# Patient Record
Sex: Female | Born: 1950 | Race: Black or African American | Hispanic: No | Smoking: Former smoker
Health system: Southern US, Community
[De-identification: ages and names within clinical notes are randomized; demographics above are authoritative.]

## PROBLEM LIST (undated history)

## (undated) DIAGNOSIS — M2550 Pain in unspecified joint: Secondary | ICD-10-CM

## (undated) DIAGNOSIS — Z9289 Personal history of other medical treatment: Secondary | ICD-10-CM

## (undated) DIAGNOSIS — E785 Hyperlipidemia, unspecified: Secondary | ICD-10-CM

## (undated) DIAGNOSIS — E119 Type 2 diabetes mellitus without complications: Secondary | ICD-10-CM

## (undated) DIAGNOSIS — M549 Dorsalgia, unspecified: Secondary | ICD-10-CM

## (undated) DIAGNOSIS — K219 Gastro-esophageal reflux disease without esophagitis: Secondary | ICD-10-CM

## (undated) HISTORY — PX: CHOLECYSTECTOMY: SHX55

## (undated) HISTORY — PX: COLONOSCOPY: SHX174

## (undated) HISTORY — DX: Dorsalgia, unspecified: M54.9

## (undated) HISTORY — PX: LEG SURGERY: SHX1003

## (undated) HISTORY — PX: REPLACEMENT TOTAL KNEE: SUR1224

## (undated) HISTORY — DX: Pain in unspecified joint: M25.50

## (undated) HISTORY — PX: ABDOMINAL HYSTERECTOMY: SHX81

---

## 1998-01-12 ENCOUNTER — Other Ambulatory Visit: Admission: RE | Admit: 1998-01-12 | Discharge: 1998-01-12 | Payer: Self-pay | Admitting: Obstetrics and Gynecology

## 1998-03-07 ENCOUNTER — Encounter: Payer: Self-pay | Admitting: Orthopedic Surgery

## 1998-03-07 ENCOUNTER — Encounter: Payer: Self-pay | Admitting: Emergency Medicine

## 1998-03-07 ENCOUNTER — Inpatient Hospital Stay (HOSPITAL_COMMUNITY): Admission: EM | Admit: 1998-03-07 | Discharge: 1998-03-13 | Payer: Self-pay | Admitting: Emergency Medicine

## 1998-03-13 ENCOUNTER — Inpatient Hospital Stay
Admission: RE | Admit: 1998-03-13 | Discharge: 1998-03-21 | Payer: Self-pay | Admitting: Physical Medicine and Rehabilitation

## 1998-06-13 ENCOUNTER — Encounter: Admission: RE | Admit: 1998-06-13 | Discharge: 1998-09-11 | Payer: Self-pay | Admitting: Orthopedic Surgery

## 1998-09-21 ENCOUNTER — Encounter: Admission: RE | Admit: 1998-09-21 | Discharge: 1999-05-04 | Payer: Self-pay

## 1998-11-21 ENCOUNTER — Encounter: Payer: Self-pay | Admitting: Orthopedic Surgery

## 1998-11-23 ENCOUNTER — Ambulatory Visit (HOSPITAL_COMMUNITY): Admission: RE | Admit: 1998-11-23 | Discharge: 1998-11-23 | Payer: Self-pay | Admitting: Orthopedic Surgery

## 1998-12-12 ENCOUNTER — Encounter: Admission: RE | Admit: 1998-12-12 | Discharge: 1999-02-20 | Payer: Self-pay | Admitting: Orthopedic Surgery

## 1999-06-13 ENCOUNTER — Ambulatory Visit (HOSPITAL_COMMUNITY): Admission: RE | Admit: 1999-06-13 | Discharge: 1999-06-14 | Payer: Self-pay | Admitting: Orthopedic Surgery

## 1999-07-10 ENCOUNTER — Other Ambulatory Visit: Admission: RE | Admit: 1999-07-10 | Discharge: 1999-07-10 | Payer: Self-pay | Admitting: Obstetrics and Gynecology

## 2001-06-18 ENCOUNTER — Encounter: Payer: Self-pay | Admitting: Orthopedic Surgery

## 2001-06-18 ENCOUNTER — Emergency Department (HOSPITAL_COMMUNITY): Admission: EM | Admit: 2001-06-18 | Discharge: 2001-06-18 | Payer: Self-pay | Admitting: Emergency Medicine

## 2002-05-03 ENCOUNTER — Encounter (INDEPENDENT_AMBULATORY_CARE_PROVIDER_SITE_OTHER): Payer: Self-pay | Admitting: *Deleted

## 2002-05-03 LAB — CONVERTED CEMR LAB

## 2002-05-05 ENCOUNTER — Ambulatory Visit (HOSPITAL_COMMUNITY): Admission: RE | Admit: 2002-05-05 | Discharge: 2002-05-05 | Payer: Self-pay | Admitting: Family Medicine

## 2002-09-24 ENCOUNTER — Encounter: Admission: RE | Admit: 2002-09-24 | Discharge: 2002-09-24 | Payer: Self-pay | Admitting: Family Medicine

## 2003-01-25 ENCOUNTER — Encounter: Admission: RE | Admit: 2003-01-25 | Discharge: 2003-01-25 | Payer: Self-pay | Admitting: Family Medicine

## 2003-02-02 ENCOUNTER — Encounter: Admission: RE | Admit: 2003-02-02 | Discharge: 2003-05-03 | Payer: Self-pay | Admitting: Family Medicine

## 2003-05-02 ENCOUNTER — Encounter: Admission: RE | Admit: 2003-05-02 | Discharge: 2003-05-02 | Payer: Self-pay | Admitting: Sports Medicine

## 2003-05-03 ENCOUNTER — Encounter: Admission: RE | Admit: 2003-05-03 | Discharge: 2003-05-18 | Payer: Self-pay | Admitting: Family Medicine

## 2003-05-11 ENCOUNTER — Ambulatory Visit (HOSPITAL_COMMUNITY): Admission: RE | Admit: 2003-05-11 | Discharge: 2003-05-11 | Payer: Self-pay | Admitting: Sports Medicine

## 2004-01-13 ENCOUNTER — Encounter: Admission: RE | Admit: 2004-01-13 | Discharge: 2004-01-13 | Payer: Self-pay | Admitting: Family Medicine

## 2004-02-17 ENCOUNTER — Ambulatory Visit: Payer: Self-pay | Admitting: Family Medicine

## 2004-03-23 ENCOUNTER — Ambulatory Visit: Payer: Self-pay | Admitting: Sports Medicine

## 2004-04-24 ENCOUNTER — Ambulatory Visit: Payer: Self-pay | Admitting: Family Medicine

## 2004-06-15 ENCOUNTER — Ambulatory Visit: Payer: Self-pay | Admitting: Family Medicine

## 2004-06-20 ENCOUNTER — Ambulatory Visit (HOSPITAL_COMMUNITY): Admission: RE | Admit: 2004-06-20 | Discharge: 2004-06-20 | Payer: Self-pay | Admitting: Family Medicine

## 2004-10-15 ENCOUNTER — Ambulatory Visit: Payer: Self-pay | Admitting: Family Medicine

## 2004-10-15 ENCOUNTER — Ambulatory Visit (HOSPITAL_COMMUNITY): Admission: RE | Admit: 2004-10-15 | Discharge: 2004-10-15 | Payer: Self-pay | Admitting: Family Medicine

## 2004-11-07 ENCOUNTER — Ambulatory Visit: Payer: Self-pay | Admitting: Physical Medicine & Rehabilitation

## 2004-11-07 ENCOUNTER — Inpatient Hospital Stay (HOSPITAL_COMMUNITY): Admission: RE | Admit: 2004-11-07 | Discharge: 2004-11-13 | Payer: Self-pay | Admitting: Orthopedic Surgery

## 2004-11-17 ENCOUNTER — Inpatient Hospital Stay (HOSPITAL_COMMUNITY): Admission: RE | Admit: 2004-11-17 | Discharge: 2004-11-21 | Payer: Self-pay | Admitting: Orthopedic Surgery

## 2004-12-14 ENCOUNTER — Ambulatory Visit (HOSPITAL_COMMUNITY): Admission: RE | Admit: 2004-12-14 | Discharge: 2004-12-14 | Payer: Self-pay | Admitting: Orthopedic Surgery

## 2004-12-24 ENCOUNTER — Ambulatory Visit (HOSPITAL_COMMUNITY): Admission: RE | Admit: 2004-12-24 | Discharge: 2004-12-24 | Payer: Self-pay | Admitting: Orthopedic Surgery

## 2005-01-02 ENCOUNTER — Encounter: Admission: RE | Admit: 2005-01-02 | Discharge: 2005-01-20 | Payer: Self-pay | Admitting: Orthopedic Surgery

## 2005-01-21 ENCOUNTER — Encounter: Admission: RE | Admit: 2005-01-21 | Discharge: 2005-03-01 | Payer: Self-pay | Admitting: Orthopedic Surgery

## 2005-02-15 ENCOUNTER — Ambulatory Visit (HOSPITAL_COMMUNITY): Admission: RE | Admit: 2005-02-15 | Discharge: 2005-02-15 | Payer: Self-pay | Admitting: Orthopedic Surgery

## 2005-03-06 ENCOUNTER — Ambulatory Visit (HOSPITAL_COMMUNITY): Admission: RE | Admit: 2005-03-06 | Discharge: 2005-03-06 | Payer: Self-pay | Admitting: Orthopedic Surgery

## 2005-03-11 ENCOUNTER — Ambulatory Visit (HOSPITAL_COMMUNITY): Admission: RE | Admit: 2005-03-11 | Discharge: 2005-03-11 | Payer: Self-pay | Admitting: Orthopedic Surgery

## 2005-03-15 ENCOUNTER — Ambulatory Visit (HOSPITAL_COMMUNITY): Admission: RE | Admit: 2005-03-15 | Discharge: 2005-03-15 | Payer: Self-pay | Admitting: Orthopedic Surgery

## 2005-03-19 ENCOUNTER — Ambulatory Visit (HOSPITAL_COMMUNITY): Admission: RE | Admit: 2005-03-19 | Discharge: 2005-03-19 | Payer: Self-pay | Admitting: Orthopedic Surgery

## 2005-03-21 ENCOUNTER — Ambulatory Visit: Payer: Self-pay | Admitting: Family Medicine

## 2005-03-28 ENCOUNTER — Inpatient Hospital Stay (HOSPITAL_COMMUNITY): Admission: RE | Admit: 2005-03-28 | Discharge: 2005-04-02 | Payer: Self-pay | Admitting: Orthopedic Surgery

## 2005-06-05 ENCOUNTER — Emergency Department (HOSPITAL_COMMUNITY): Admission: EM | Admit: 2005-06-05 | Discharge: 2005-06-05 | Payer: Self-pay | Admitting: Emergency Medicine

## 2005-07-01 ENCOUNTER — Ambulatory Visit: Payer: Self-pay | Admitting: Family Medicine

## 2005-07-09 ENCOUNTER — Ambulatory Visit: Payer: Self-pay | Admitting: Sports Medicine

## 2005-07-16 ENCOUNTER — Encounter: Admission: RE | Admit: 2005-07-16 | Discharge: 2005-07-16 | Payer: Self-pay | Admitting: Sports Medicine

## 2005-08-14 ENCOUNTER — Ambulatory Visit (HOSPITAL_COMMUNITY): Admission: RE | Admit: 2005-08-14 | Discharge: 2005-08-14 | Payer: Self-pay | Admitting: Sports Medicine

## 2005-08-14 ENCOUNTER — Ambulatory Visit: Payer: Self-pay | Admitting: Family Medicine

## 2005-08-27 ENCOUNTER — Encounter: Admission: RE | Admit: 2005-08-27 | Discharge: 2005-08-27 | Payer: Self-pay | Admitting: Orthopedic Surgery

## 2005-09-05 ENCOUNTER — Inpatient Hospital Stay (HOSPITAL_COMMUNITY): Admission: RE | Admit: 2005-09-05 | Discharge: 2005-09-10 | Payer: Self-pay | Admitting: Orthopedic Surgery

## 2005-09-06 ENCOUNTER — Ambulatory Visit: Payer: Self-pay | Admitting: Physical Medicine & Rehabilitation

## 2005-09-22 ENCOUNTER — Encounter: Payer: Self-pay | Admitting: Vascular Surgery

## 2005-09-22 ENCOUNTER — Inpatient Hospital Stay (HOSPITAL_COMMUNITY): Admission: EM | Admit: 2005-09-22 | Discharge: 2005-09-29 | Payer: Self-pay | Admitting: Emergency Medicine

## 2005-10-04 ENCOUNTER — Inpatient Hospital Stay (HOSPITAL_COMMUNITY): Admission: EM | Admit: 2005-10-04 | Discharge: 2005-10-07 | Payer: Self-pay | Admitting: Orthopedic Surgery

## 2005-10-23 ENCOUNTER — Encounter: Admission: RE | Admit: 2005-10-23 | Discharge: 2006-01-20 | Payer: Self-pay | Admitting: Orthopedic Surgery

## 2005-11-11 ENCOUNTER — Encounter: Admission: RE | Admit: 2005-11-11 | Discharge: 2005-11-11 | Payer: Self-pay | Admitting: Orthopedic Surgery

## 2005-12-31 ENCOUNTER — Encounter: Admission: RE | Admit: 2005-12-31 | Discharge: 2005-12-31 | Payer: Self-pay | Admitting: Anesthesiology

## 2006-05-08 ENCOUNTER — Ambulatory Visit: Payer: Self-pay | Admitting: Sports Medicine

## 2006-06-06 LAB — HM COLONOSCOPY

## 2006-06-12 ENCOUNTER — Ambulatory Visit: Payer: Self-pay | Admitting: Family Medicine

## 2006-07-31 DIAGNOSIS — E669 Obesity, unspecified: Secondary | ICD-10-CM | POA: Insufficient documentation

## 2006-07-31 DIAGNOSIS — M545 Low back pain, unspecified: Secondary | ICD-10-CM | POA: Insufficient documentation

## 2006-07-31 DIAGNOSIS — M549 Dorsalgia, unspecified: Secondary | ICD-10-CM | POA: Insufficient documentation

## 2006-07-31 DIAGNOSIS — E785 Hyperlipidemia, unspecified: Secondary | ICD-10-CM | POA: Insufficient documentation

## 2006-07-31 DIAGNOSIS — K219 Gastro-esophageal reflux disease without esophagitis: Secondary | ICD-10-CM | POA: Insufficient documentation

## 2006-08-01 ENCOUNTER — Encounter (INDEPENDENT_AMBULATORY_CARE_PROVIDER_SITE_OTHER): Payer: Self-pay | Admitting: *Deleted

## 2006-08-27 ENCOUNTER — Encounter: Payer: Self-pay | Admitting: Family Medicine

## 2006-08-27 ENCOUNTER — Ambulatory Visit (HOSPITAL_COMMUNITY): Admission: RE | Admit: 2006-08-27 | Discharge: 2006-08-27 | Payer: Self-pay | Admitting: Internal Medicine

## 2006-08-28 ENCOUNTER — Encounter: Payer: Self-pay | Admitting: Family Medicine

## 2006-08-29 ENCOUNTER — Encounter: Admission: RE | Admit: 2006-08-29 | Discharge: 2006-08-29 | Payer: Self-pay | Admitting: Orthopedic Surgery

## 2006-09-02 ENCOUNTER — Ambulatory Visit: Payer: Self-pay

## 2006-09-08 ENCOUNTER — Encounter: Payer: Self-pay | Admitting: Family Medicine

## 2006-09-23 ENCOUNTER — Ambulatory Visit: Payer: Self-pay | Admitting: Sports Medicine

## 2006-09-23 DIAGNOSIS — M1990 Unspecified osteoarthritis, unspecified site: Secondary | ICD-10-CM | POA: Insufficient documentation

## 2006-10-02 ENCOUNTER — Inpatient Hospital Stay (HOSPITAL_COMMUNITY): Admission: RE | Admit: 2006-10-02 | Discharge: 2006-10-06 | Payer: Self-pay | Admitting: Orthopedic Surgery

## 2006-12-01 ENCOUNTER — Telehealth: Payer: Self-pay | Admitting: *Deleted

## 2006-12-25 ENCOUNTER — Encounter: Admission: RE | Admit: 2006-12-25 | Discharge: 2006-12-25 | Payer: Self-pay | Admitting: Orthopedic Surgery

## 2007-03-05 ENCOUNTER — Encounter: Admission: RE | Admit: 2007-03-05 | Discharge: 2007-03-05 | Payer: Self-pay | Admitting: Orthopedic Surgery

## 2007-04-01 ENCOUNTER — Ambulatory Visit: Payer: Self-pay | Admitting: Family Medicine

## 2007-04-01 ENCOUNTER — Encounter (INDEPENDENT_AMBULATORY_CARE_PROVIDER_SITE_OTHER): Payer: Self-pay | Admitting: Family Medicine

## 2007-04-02 LAB — CONVERTED CEMR LAB
CO2: 24 meq/L (ref 19–32)
Calcium: 9.7 mg/dL (ref 8.4–10.5)
LDL Cholesterol: 139 mg/dL — ABNORMAL HIGH (ref 0–99)
Potassium: 4.5 meq/L (ref 3.5–5.3)
Total CHOL/HDL Ratio: 4
VLDL: 20 mg/dL (ref 0–40)

## 2007-04-27 ENCOUNTER — Encounter: Admission: RE | Admit: 2007-04-27 | Discharge: 2007-04-27 | Payer: Self-pay | Admitting: Orthopedic Surgery

## 2007-05-04 ENCOUNTER — Encounter: Admission: RE | Admit: 2007-05-04 | Discharge: 2007-05-04 | Payer: Self-pay | Admitting: Orthopedic Surgery

## 2007-05-18 ENCOUNTER — Ambulatory Visit (HOSPITAL_COMMUNITY): Admission: RE | Admit: 2007-05-18 | Discharge: 2007-05-19 | Payer: Self-pay | Admitting: Orthopedic Surgery

## 2007-06-22 ENCOUNTER — Encounter: Admission: RE | Admit: 2007-06-22 | Discharge: 2007-09-01 | Payer: Self-pay | Admitting: Orthopedic Surgery

## 2007-07-21 ENCOUNTER — Ambulatory Visit: Payer: Self-pay | Admitting: Family Medicine

## 2007-08-21 ENCOUNTER — Ambulatory Visit: Payer: Self-pay | Admitting: Family Medicine

## 2007-09-08 ENCOUNTER — Ambulatory Visit (HOSPITAL_COMMUNITY): Admission: RE | Admit: 2007-09-08 | Discharge: 2007-09-08 | Payer: Self-pay | Admitting: Family Medicine

## 2007-12-08 ENCOUNTER — Encounter: Admission: RE | Admit: 2007-12-08 | Discharge: 2007-12-08 | Payer: Self-pay | Admitting: Orthopedic Surgery

## 2008-02-17 ENCOUNTER — Telehealth: Payer: Self-pay | Admitting: *Deleted

## 2008-02-17 ENCOUNTER — Ambulatory Visit: Payer: Self-pay | Admitting: Family Medicine

## 2008-02-19 ENCOUNTER — Encounter: Payer: Self-pay | Admitting: Family Medicine

## 2008-02-26 ENCOUNTER — Ambulatory Visit: Payer: Self-pay | Admitting: Family Medicine

## 2008-07-27 ENCOUNTER — Encounter: Admission: RE | Admit: 2008-07-27 | Discharge: 2008-07-27 | Payer: Self-pay | Admitting: Orthopedic Surgery

## 2008-09-19 ENCOUNTER — Ambulatory Visit (HOSPITAL_COMMUNITY): Admission: RE | Admit: 2008-09-19 | Discharge: 2008-09-19 | Payer: Self-pay | Admitting: Family Medicine

## 2008-11-08 ENCOUNTER — Encounter (INDEPENDENT_AMBULATORY_CARE_PROVIDER_SITE_OTHER): Payer: Self-pay | Admitting: Family Medicine

## 2008-12-20 ENCOUNTER — Encounter: Admission: RE | Admit: 2008-12-20 | Discharge: 2008-12-20 | Payer: Self-pay | Admitting: Orthopedic Surgery

## 2009-01-05 ENCOUNTER — Ambulatory Visit: Payer: Self-pay | Admitting: Family Medicine

## 2009-01-20 ENCOUNTER — Ambulatory Visit: Payer: Self-pay | Admitting: Family Medicine

## 2009-01-20 ENCOUNTER — Encounter: Payer: Self-pay | Admitting: Family Medicine

## 2009-01-26 LAB — CONVERTED CEMR LAB
AST: 16 units/L (ref 0–37)
CO2: 25 meq/L (ref 19–32)
Chloride: 104 meq/L (ref 96–112)
Cholesterol: 232 mg/dL — ABNORMAL HIGH (ref 0–200)
Glucose, Bld: 110 mg/dL — ABNORMAL HIGH (ref 70–99)
HDL: 54 mg/dL (ref >39)
Potassium: 4.4 meq/L (ref 3.5–5.3)
Sodium: 140 meq/L (ref 135–145)
Total CHOL/HDL Ratio: 4.3
Triglycerides: 95 mg/dL (ref <150)
VLDL: 19 mg/dL (ref 0–40)

## 2009-02-21 ENCOUNTER — Telehealth: Payer: Self-pay | Admitting: Family Medicine

## 2009-02-21 ENCOUNTER — Ambulatory Visit: Payer: Self-pay | Admitting: Family Medicine

## 2009-03-07 ENCOUNTER — Ambulatory Visit: Payer: Self-pay | Admitting: Family Medicine

## 2009-05-03 ENCOUNTER — Encounter: Payer: Self-pay | Admitting: Family Medicine

## 2009-05-03 ENCOUNTER — Ambulatory Visit: Payer: Self-pay | Admitting: Family Medicine

## 2009-05-03 DIAGNOSIS — R21 Rash and other nonspecific skin eruption: Secondary | ICD-10-CM | POA: Insufficient documentation

## 2009-05-03 LAB — CONVERTED CEMR LAB
CO2: 25 meq/L (ref 19–32)
Glucose, Bld: 120 mg/dL — ABNORMAL HIGH (ref 70–99)
LDL Cholesterol: 78 mg/dL (ref 0–99)
Sodium: 143 meq/L (ref 135–145)
Total CHOL/HDL Ratio: 2.8

## 2009-05-10 ENCOUNTER — Telehealth: Payer: Self-pay | Admitting: *Deleted

## 2009-05-22 ENCOUNTER — Encounter: Admission: RE | Admit: 2009-05-22 | Discharge: 2009-05-22 | Payer: Self-pay | Admitting: Orthopedic Surgery

## 2009-06-05 ENCOUNTER — Encounter: Payer: Self-pay | Admitting: Family Medicine

## 2009-09-13 ENCOUNTER — Encounter: Admission: RE | Admit: 2009-09-13 | Discharge: 2009-09-13 | Payer: Self-pay | Admitting: Orthopedic Surgery

## 2009-09-25 ENCOUNTER — Ambulatory Visit (HOSPITAL_COMMUNITY): Admission: RE | Admit: 2009-09-25 | Discharge: 2009-09-25 | Payer: Self-pay | Admitting: Family Medicine

## 2009-11-28 ENCOUNTER — Encounter: Payer: Self-pay | Admitting: Family Medicine

## 2009-11-28 ENCOUNTER — Ambulatory Visit: Payer: Self-pay | Admitting: Family Medicine

## 2009-11-28 LAB — CONVERTED CEMR LAB
Albumin: 3.8 g/dL (ref 3.5–5.2)
Alkaline Phosphatase: 87 units/L (ref 39–117)
Direct LDL: 152 mg/dL — ABNORMAL HIGH
Potassium: 3.8 meq/L (ref 3.5–5.3)
Sodium: 137 meq/L (ref 135–145)

## 2009-11-29 ENCOUNTER — Telehealth: Payer: Self-pay | Admitting: *Deleted

## 2009-12-01 ENCOUNTER — Ambulatory Visit: Payer: Self-pay | Admitting: Family Medicine

## 2009-12-01 DIAGNOSIS — E119 Type 2 diabetes mellitus without complications: Secondary | ICD-10-CM | POA: Insufficient documentation

## 2009-12-01 LAB — CONVERTED CEMR LAB: Hgb A1c MFr Bld: >14 %

## 2009-12-05 ENCOUNTER — Telehealth: Payer: Self-pay | Admitting: Family Medicine

## 2009-12-14 ENCOUNTER — Encounter: Admission: RE | Admit: 2009-12-14 | Discharge: 2009-12-14 | Payer: Self-pay | Admitting: Orthopedic Surgery

## 2009-12-27 ENCOUNTER — Ambulatory Visit: Payer: Self-pay | Admitting: Family Medicine

## 2010-01-03 ENCOUNTER — Encounter: Admission: RE | Admit: 2010-01-03 | Discharge: 2010-03-01 | Payer: Self-pay | Admitting: Family Medicine

## 2010-01-05 ENCOUNTER — Encounter: Payer: Self-pay | Admitting: Family Medicine

## 2010-02-28 ENCOUNTER — Ambulatory Visit: Payer: Self-pay | Admitting: Family Medicine

## 2010-03-05 ENCOUNTER — Encounter: Admission: RE | Admit: 2010-03-05 | Discharge: 2010-03-05 | Payer: Self-pay | Admitting: Orthopedic Surgery

## 2010-03-06 ENCOUNTER — Ambulatory Visit: Payer: Self-pay | Admitting: Family Medicine

## 2010-03-12 ENCOUNTER — Telehealth: Payer: Self-pay | Admitting: Family Medicine

## 2010-03-12 ENCOUNTER — Ambulatory Visit: Payer: Self-pay | Admitting: Family Medicine

## 2010-03-12 ENCOUNTER — Encounter: Payer: Self-pay | Admitting: Family Medicine

## 2010-03-12 LAB — CONVERTED CEMR LAB
Cholesterol: 170 mg/dL (ref 0–200)
HDL: 61 mg/dL (ref >39)
Total CHOL/HDL Ratio: 2.8
VLDL: 19 mg/dL (ref 0–40)

## 2010-03-13 ENCOUNTER — Telehealth: Payer: Self-pay | Admitting: *Deleted

## 2010-03-13 ENCOUNTER — Encounter: Payer: Self-pay | Admitting: Family Medicine

## 2010-03-28 ENCOUNTER — Ambulatory Visit: Payer: Self-pay | Admitting: Family Medicine

## 2010-05-11 ENCOUNTER — Encounter: Admission: RE | Admit: 2010-05-11 | Payer: Self-pay | Source: Home / Self Care | Admitting: Family Medicine

## 2010-05-14 ENCOUNTER — Encounter: Payer: Self-pay | Admitting: Family Medicine

## 2010-05-22 ENCOUNTER — Telehealth: Payer: Self-pay | Admitting: Family Medicine

## 2010-06-01 ENCOUNTER — Ambulatory Visit: Payer: Self-pay

## 2010-06-24 ENCOUNTER — Encounter: Payer: Self-pay | Admitting: Family Medicine

## 2010-07-04 ENCOUNTER — Telehealth: Payer: Self-pay | Admitting: Family Medicine

## 2010-07-05 NOTE — Assessment & Plan Note (Signed)
Summary: flu shot/eo  Nurse Visit patient left without receiving flu vaccine . Theresia Lo RN  March 12, 2010 8:47 AM   Allergies: 1)  ! Morphine 2)  ! Clinoril  Orders Added: 1)  No Charge Patient Arrived (NCPA0) [NCPA0]

## 2010-07-05 NOTE — Progress Notes (Signed)
Summary: refill  Phone Note Refill Request Call back at Home Phone [phone] Call back at [phone] Message from:  Patient  Refills Requested: Medication #1:  METFORMIN HCL 500 MG TABS 1 tab a day for 7 days pt is out - needs some for this afternoon  Initial call taken by: De Nurse,  March 12, 2010 1:41 PM    Prescriptions: METFORMIN HCL 500 MG TABS (METFORMIN HCL) 1 tab a day for 7 days, then increase to 1 tab in the morning and 1 in the evening for 7 days. Then increase to 2 tablets twice a day.  #90 x 3   Entered and Authorized by:   Jamie Brookes MD   Signed by:   Jamie Brookes MD on 03/12/2010   Method used:   Electronically to        [facility] (retail)       [address]       [place], [place]  [address]       Ph: [phone]       Fax: [phone]   RxID:   [id number]

## 2010-07-05 NOTE — Progress Notes (Signed)
Summary: meds prob  Phone Note Call from Patient Call back at Home Phone [phone]   Caller: Patient Summary of Call: pt didn't get the meter filled b/c her insurance [facility]) will not pay for it.  Also for the test strips/ lancets [facility] Initial call taken by: De Nurse,  December 05, 2009 9:13 AM  Follow-up for Phone Call        I called the [facility] . they needed the diagnosis code. I gave it to them & notified pt that it should be ready this am Follow-up by: Golden Circle RN,  December 05, 2009 9:23 AM  Additional Follow-up for Phone Call Additional follow up Details #1::        thanks.  Additional Follow-up by: Jamie Brookes MD,  December 06, 2009 5:33 PM

## 2010-07-05 NOTE — Letter (Signed)
Summary: Generic Letter  [facility]  [address]   [place], [place] [address]   Phone: [phone]  Fax: [phone]    05/14/2010  [facility] [address] [place], [place]  [address]  Dear Ms. [facility],  I recieved a letter form the [facility] letting me know that your A1c was 5.6 and your weight was 267.7 lbs. Congradulations! You are doing great. I don't want you having low blood sugars so lets decrease your meds from 1000 mg of Metformin twice daily to 500mg  of Metformin twice daily. You are welcome to break the pills in half and take one half twice daily until they are gone. I will change your prescription to reflect the dose change. I tried to call you about this change but I think we have the wrong number for you. Please call my office to give [place] your new phone number and with any questions you might have.     Sincerely,   Jamie Brookes MD  Appended Document: A1c updated    Clinical Lists Changes  Observations: Added new observation of HGBA1C: 5.6 % (05/11/2010 14:14) Added new observation of WEIGHT: 267.7 lb (05/11/2010 14:14)       -  Date:  05/11/2010    Weight 267.7    HgBA1c 5.6

## 2010-07-05 NOTE — Assessment & Plan Note (Signed)
Summary: DM2, skin lesions   Vital Signs:  Patient profile:   60 year old female Weight:      282.5 pounds Temp:     98.1 degrees F oral Pulse rate:   93 / minute Pulse rhythm:   regular BP sitting:   124 / 82  (left arm) Cuff size:   large  Vitals Entered By: Loralee Pacas CMA (December 27, 2009 8:59 AM)  Primary Care Provider:  Jamie Brookes MD  CC:  diabetes follow-up and skin lesions.  History of Present Illness: Diabetes: Pt was suppose to follow up with me after she had been to her diabetes education classes. She is going to the classes soon but has not been yet.  She is taking her metformin as prescribed. She says that her eyes feel blurry sometimes and wonders what that is about. She is not testing her glucose since she has not learned how to do this at the classes yet but she does have all her materials needed for testing. She has been to her Weight Watchers class and has gotten some instruction about diabetic diets. She has lost another 0.5 lbs.   Skin Lesions: Pt has started back on her Simvastatin. Soon after she started on it she noticed that she has skin lesions again. They are on her wrist and Rt ankle. She says they came up right after she started the Simvastatin so she has stopped it.   Current Medications (verified): 1)  Fish Oil 300 Mg  Caps (Omega-3 Fatty Acids) .Marland Kitchen.. 1 Tab By Mouth Once Daily 2)  Aspir-Low 81 Mg Tbec (Aspirin) .Marland Kitchen.. 1 Once Daily Po 3)  Calcium-Vitamin D 500-125 Mg-Unit  Tabs (Calcium-Vitamin D) 4)  Naprosyn 500 Mg Tabs (Naproxen) .Marland Kitchen.. 1 Two Times A Day 5)  Furosemide 20 Mg  Tabs (Furosemide) .Marland Kitchen.. 1 Tab By Mouth Daily or As Needed For Swelling in Legs 6)  Lorcet 10/650 10-650 Mg  Tabs (Hydrocodone-Acetaminophen) .... As Needed 7)  Alendronate Sodium 70 Mg Tabs (Alendronate Sodium) .... One Tab By Mouth Qweek 8)  Qc Daily Multivitamins/iron  Tabs (Multiple Vitamins-Iron) 9)  Simvastatin 40 Mg Tabs (Simvastatin) .... Take One Pill Daily. 10)   Metformin Hcl 500 Mg Tabs (Metformin Hcl) .Marland Kitchen.. 1 Tab A Day For 7 Days, Then Increase To 1 Tab in The Morning and 1 in The Evening For 7 Days. Then Increase To 2 Tablets Twice A Day. 11)  Lancets  Misc (Lancets) 12)  Prodigy Glucometer 13)  Prodigy Test Strips  Allergies (verified): 1)  ! Morphine  Review of Systems        vitals reviewed and pertinent negatives and positives seen in HPI   Physical Exam  General:  Well-developed,well-nourished,in no acute distress; alert,appropriate and cooperative throughout examination Lungs:  Normal respiratory effort, chest expands symmetrically. Lungs are clear to auscultation, no crackles or wheezes. Heart:  Normal rate and regular rhythm. S1 and S2 normal without gallop, murmur, click, rub or other extra sounds. Skin:  Pt has some small lesions on her arms (ventral surface) and on the Rt ankle. The are pink, smooth, slightly shiny lesions. She has multiple scars on her arms from these lesions in the past.    Impression & Recommendations:  Problem # 1:  DIABETES MELLITUS, TYPE II (ICD-250.00) Assessment Unchanged Pt is taking her meds, not testing yet since she hasn't been to the classses to learn how to do it. Pt is having some blurry vision. Will test CBG today.   Her  updated medication list for this problem includes:    Aspir-low 81 Mg Tbec (Aspirin) .Marland Kitchen... 1 once daily po    Metformin Hcl 500 Mg Tabs (Metformin hcl) .Marland Kitchen... 1 tab a day for 7 days, then increase to 1 tab in the morning and 1 in the evening for 7 days. then increase to 2 tablets twice a day.  Orders: Glucose Cap-[facility] (04540) [facility]- Est Level  3 ([id number])  Problem # 2:  SKIN RASH (ICD-782.1) Assessment: Deteriorated Pt thinks it is her Simvastatin that is causing the lesions and when I looked it up there is a 5% risk of skin lesions with this medicine. Plan to switch her to  Lovastatin which has a < 1% risk of skin lesions. However, we discussed if she can not afford this medicine  the risk of skin lesions is less than the risk of stroke and MI in a patient with diabetes and she should stay on a statin.   Orders: [facility]- Est Level  3 (99213)  Complete Medication List: 1)  Fish Oil 300 Mg Caps (Omega-3 fatty acids) .Marland Kitchen.. 1 tab by mouth once daily 2)  Aspir-low 81 Mg Tbec (Aspirin) .Marland Kitchen.. 1 once daily po 3)  Calcium-vitamin D 500-125 Mg-unit Tabs (Calcium-vitamin d) 4)  Naprosyn 500 Mg Tabs (Naproxen) .Marland Kitchen.. 1 two times a day 5)  Furosemide 20 Mg Tabs (Furosemide) .Marland Kitchen.. 1 tab by mouth daily or as needed for swelling in legs 6)  Lorcet 10/650 10-650 Mg Tabs (Hydrocodone-acetaminophen) .... As needed 7)  Alendronate Sodium 70 Mg Tabs (Alendronate sodium) .... One tab by mouth qweek 8)  Qc Daily Multivitamins/iron Tabs (Multiple vitamins-iron) 9)  Simvastatin 40 Mg Tabs (Simvastatin) .... Take one pill daily. 10)  Metformin Hcl 500 Mg Tabs (Metformin hcl) .Marland Kitchen.. 1 tab a day for 7 days, then increase to 1 tab in the morning and 1 in the evening for 7 days. then increase to 2 tablets twice a day. 11)  Lancets Misc (Lancets) 12)  Prodigy Glucometer  13)  Prodigy Test Strips   Patient Instructions: 1)  Pt is to follow up with me in October for repeat testing of her A1c.    Prevention & Chronic Care Immunizations   Influenza vaccine: Fluvax MCR  (03/07/2009)   Influenza vaccine due: 03/31/2008    Tetanus booster: 05/09/2006: Done.   Tetanus booster due: 05/09/2016    Pneumococcal vaccine: Not documented    H. zoster vaccine: Not documented  Colorectal Screening   Hemoccult: Not documented   Hemoccult action/deferral: Not indicated  (01/20/2009)    Colonoscopy: verbal report by pt: normal.  next due 10  years.  performed at office "accross the street"  (08/13/2006)   Colonoscopy due: 08/12/2016  Other Screening   Pap smear: Done.  (05/03/2002)   Pap smear due: Not Indicated    Mammogram: ASSESSMENT: Negative - BI-RADS 1^MM DIGITAL SCREENING  (09/25/2009)   Mammogram  due: 09/19/2009    DXA bone density scan: Not documented   Smoking status: never  (12/01/2009)  Diabetes Mellitus   HgbA1C: >14.0  (12/01/2009)    Eye exam: Not documented    Foot exam: Not documented   High risk foot: Not documented   Foot care education: Not documented    Urine microalbumin/creatinine ratio: Not documented    Diabetes flowsheet reviewed?: Yes   Progress toward A1C goal: Unchanged  Lipids   Total Cholesterol: 152  (05/03/2009)   LDL: 78  (05/03/2009)   LDL Direct: 152  (11/28/2009)  HDL: 55  (05/03/2009)   Triglycerides: 94  (05/03/2009)    SGOT (AST): 15  (11/28/2009)   SGPT (ALT): 14  (11/28/2009)   Alkaline phosphatase: 87  (11/28/2009)   Total bilirubin: 0.3  (11/28/2009)    Lipid flowsheet reviewed?: Yes   Progress toward LDL goal: Unchanged  Self-Management Support :   Personal Goals (by the next clinic visit) :      Personal blood pressure goal: 140/90  (01/20/2009)     Personal LDL goal: 100  (01/20/2009)    Diabetes self-management support: Not documented    Lipid self-management support: Not documented

## 2010-07-05 NOTE — Assessment & Plan Note (Signed)
Summary: flu shot,tcb  Nurse Visit   Vital Signs:  Patient profile:   60 year old female Temp:     98.3 degrees F  Vitals Entered By: Theresia Lo RN (March 28, 2010 10:33 AM)  Allergies: 1)  ! Morphine 2)  ! Clinoril  Immunizations Administered:  Influenza Vaccine # 1:    Vaccine Type: Fluvax MCR    Site: left deltoid    Mfr: GlaxoSmithKline    Dose: 0.5 ml    Route: IM    Given by: Theresia Lo RN    Exp. Date: 11/28/2010    Lot #: [id number]    VIS given: 12/26/09 version given March 28, 2010.  Flu Vaccine Consent Questions:    Do you have a history of severe allergic reactions to this vaccine? no    Any prior history of allergic reactions to egg and/or gelatin? no    Do you have a sensitivity to the preservative Thimersol? no    Do you have a past history of Guillan-Barre Syndrome? no    Do you currently have an acute febrile illness? no    Have you ever had a severe reaction to latex? no    Vaccine information given and explained to patient? yes    Are you currently pregnant? no  Orders Added: 1)  Influenza Vaccine MCR [00025] 2)  Administration Flu vaccine - MCR [G0008]

## 2010-07-05 NOTE — Progress Notes (Signed)
Summary: re: labs/ts  ---- Converted from flag ---- ---- 03/13/2010 9:53 AM, Jamie Brookes MD wrote: Please let this patient know that her cholesterol panel is wonderful (better than mine!). Keep up the good work! ------------------------------  called pt and informed.

## 2010-07-05 NOTE — Assessment & Plan Note (Signed)
Summary: check labs   Vital Signs:  Patient profile:   60 year old female Weight:      287.7 pounds Temp:     98.4 degrees F oral Pulse rate:   87 / minute Pulse rhythm:   regular BP sitting:   105 / 75  (left arm) Cuff size:   large  Vitals Entered By: Loralee Pacas CMA (November 28, 2009 3:20 PM) CC: labs   Primary Care Provider:  Jamie Brookes MD  CC:  labs.  History of Present Illness: cholesterol check- patient with h/o hyperlipidemia. previously on simvastatin. has not been taking x1 month. thought it may have been related to rash she has been seen here previously for, as well as dermatology. has been losing weight using weight watchers. does not endorse specific exercise.   Current Medications (verified): 1)  Fish Oil 300 Mg  Caps (Omega-3 Fatty Acids) .Marland Kitchen.. 1 Tab By Mouth Once Daily 2)  Aspir-Low 81 Mg Tbec (Aspirin) .Marland Kitchen.. 1 Once Daily Po 3)  Calcium-Vitamin D 500-125 Mg-Unit  Tabs (Calcium-Vitamin D) 4)  Naprosyn 500 Mg Tabs (Naproxen) .Marland Kitchen.. 1 Two Times A Day 5)  Furosemide 20 Mg  Tabs (Furosemide) .Marland Kitchen.. 1 Tab By Mouth Daily or As Needed For Swelling in Legs 6)  Lorcet 10/650 10-650 Mg  Tabs (Hydrocodone-Acetaminophen) .... As Needed 7)  Alendronate Sodium 70 Mg Tabs (Alendronate Sodium) .... One Tab By Mouth Qweek 8)  Qc Daily Multivitamins/iron  Tabs (Multiple Vitamins-Iron) 9)  Simvastatin 40 Mg Tabs (Simvastatin) .... Take One Pill Daily.  Allergies (verified): No Known Drug Allergies  Physical Exam  General:  Obese, in no acute distress; alert,appropriate and cooperative throughout examination   Impression & Recommendations:  Problem # 1:  HYPERLIPIDEMIA (ICD-272.4) Assessment Unchanged check direct LDL and CMP. f/u with PCP. will fax labs to orthopedist Dr. Myrtie Neither.   Her updated medication list for this problem includes:    Simvastatin 40 Mg Tabs (Simvastatin) .Marland Kitchen... Take one pill daily.  Orders: Direct LDL-[facility] [phone]) [facility]- Est Level  3  ([id number])  Other Orders: Comp Met-[facility] ([id number])  Patient Instructions: 1)  Follow up in 6 months.

## 2010-07-05 NOTE — Miscellaneous (Signed)
Summary: [facility] report  Clinical Lists Changes   Pt is Ht 63", We 283.1 lbs, BMI 50.2, Pt's goal weight 175 A1c=14 on 12/2009 Does not check blood sugars, Does not check feet, No alcohol or tobacco,  Does say she exercises, takes an aspirin, has seen an eye doctor  Plan is DM self management.  signed by Royal Hawthorn, MS, RD, LDN  Jamie Brookes MD  January 05, 2010 10:47 AM

## 2010-07-05 NOTE — Progress Notes (Signed)
Summary: refill: A1c 5.6   retest in 3 months, if still in 5's, trial off  Phone Note Refill Request Call back at [facility] Phone [phone] Message from:  Patient  Refills Requested: Medication #1:  METFORMIN HCL 1000 MG TABS 1 tablet every morning and 1 tablet every evening. Do not take if you are dehydrated.   Notes: 500mg  pt is out - needs new rx Initial call taken by: De Nurse,  May 22, 2010 8:41 AM    New/Updated Medications: METFORMIN HCL 500 MG TABS (METFORMIN HCL) 1 tablet every morning and 1 tablet every evening. Do not take if you are dehydrated. Prescriptions: METFORMIN HCL 500 MG TABS (METFORMIN HCL) 1 tablet every morning and 1 tablet every evening. Do not take if you are dehydrated.  #180 x 0   Entered and Authorized by:   Jamie Brookes MD   Signed by:   Jamie Brookes MD on 05/22/2010   Method used:   Electronically to        [facility] (retail)       [address]       [place], [place]  [address]       Ph: [phone]       Fax: [phone]   RxID:   [id number]

## 2010-07-05 NOTE — Assessment & Plan Note (Signed)
Summary: BP CHECK/KH  Nurse Visit  Patient in for BP check. States she worked out this AM as she has been doing for the past 2 months. she checked her BP at the [facility]  with reading of 73/27. 10 minutes later BP 94/39. states yesterday BP was 63/57, on 03/02/2010 BP was 97/54 she states. all these reading were from   BP machine at the [facility].  states she felt fine while working out but afterwards she felt jittery .   today felt a little lightheaded when she stood up . orthostatic BP checked.   Dr. [place] notified of all findings today and advised not to use BP monitor at [facility] as it sounds like it may not be accurate. advised if continues feeling whoozy to come in for follow to see MD.  Vital Signs:  Patient profile:   59 year old female Weight:      275 pounds Pulse (ortho):   80 / minute BP standing:   130 / 84  Vitals Entered By: Theresia Lo RN (March 06, 2010 12:33 PM)  Allergies: 1)  ! Morphine 2)  ! Clinoril  Orders Added: 1)  No Charge Patient Arrived (NCPA0) [NCPA0]   Vital Signs:  Patient profile:   60 year old female Weight:      275 pounds Pulse (ortho):   80 / minute BP standing:   130 / 84  Vitals Entered By: Theresia Lo RN (March 06, 2010 12:33 PM)   Serial Vital Signs/Assessments:  Time      Position  BP       Pulse  Resp  Temp     By 11:30     Lying LA  126/80   68                    Theresia Lo RN 11:30     Sitting   118/78   72                    Theresia Lo RN 11:30     Standing  130/84   80                    Theresia Lo RN

## 2010-07-05 NOTE — Assessment & Plan Note (Signed)
Summary: DM2, skin lesions   Vital Signs:  Patient profile:   60 year old female Height:      63 inches Weight:      273 pounds BMI:     48.53 Temp:     98.4 degrees F oral Pulse rate:   92 / minute BP sitting:   109 / 73  (right arm) Cuff size:   large  Vitals Entered By: Tessie Fass CMA (February 28, 2010 8:36 AM) CC: F/U DM Is Patient Diabetic? Yes Pain Assessment Patient in pain? no        Primary Care Provider:  Jamie Brookes MD  CC:  F/U DM.  History of Present Illness: DM2: Continues to go to the gym. Still doing weight watches. Has lost another 9.5 lbs. Going to the [employer] and learning from her other peers. Saw her eye doctor about 3-4 months ago. Didn't know she was a diabetic at that time. Is going to call him to let him know she is diabetic. Had an A1c on 12-01-09, will be next due on 03-03-10.  Skin lesions: Pt hsa been seen by derm in the past. She had a biopsy and it showed lichen planus. She is starting to have more lesions crop upon her arms. She has 2 new lesions on her left arm.      Habits & Providers  Alcohol-Tobacco-Diet     Tobacco Status: quit  Current Medications (verified): 1)  Fish Oil 300 Mg  Caps (Omega-3 Fatty Acids) .Marland Kitchen.. 1 Tab By Mouth Once Daily 2)  Aspir-Low 81 Mg Tbec (Aspirin) .Marland Kitchen.. 1 Once Daily Po 3)  Calcium-Vitamin D 500-125 Mg-Unit  Tabs (Calcium-Vitamin D) 4)  Naprosyn 500 Mg Tabs (Naproxen) .Marland Kitchen.. 1 Two Times A Day 5)  Furosemide 20 Mg  Tabs (Furosemide) .Marland Kitchen.. 1 Tab By Mouth Daily or As Needed For Swelling in Legs 6)  Lorcet 10/650 10-650 Mg  Tabs (Hydrocodone-Acetaminophen) .... As Needed 7)  Alendronate Sodium 70 Mg Tabs (Alendronate Sodium) .... One Tab By Mouth Qweek 8)  Qc Daily Multivitamins/iron  Tabs (Multiple Vitamins-Iron) 9)  Lovastatin 20 Mg Tabs (Lovastatin) .... Take 1 Pill Every Evening. 10)  Metformin Hcl 500 Mg Tabs (Metformin Hcl) .Marland Kitchen.. 1 Tab A Day For 7 Days, Then Increase To 1 Tab in The Morning and 1  in The Evening For 7 Days. Then Increase To 2 Tablets Twice A Day. 11)  Lancets  Misc (Lancets) 12)  Prodigy Glucometer 13)  Prodigy Test Strips  Allergies (verified): 1)  ! Morphine 2)  ! Clinoril  Social History: Smoking Status:  quit  Review of Systems        vitals reviewed and pertinent negatives and positives seen in HPI   Physical Exam  General:  Well-developed,well-nourished,in no acute distress; alert,appropriate and cooperative throughout examination, obese but losing weight.  Lungs:  Normal respiratory effort, chest expands symmetrically. Lungs are clear to auscultation, no crackles or wheezes. Heart:  Normal rate and regular rhythm. S1 and S2 normal without gallop, murmur, click, rub or other extra sounds. Skin:  Rt forearm has 2 small almond shaped pink/silvery lesions.   Diabetes Management Exam:    Foot Exam (with socks and/or shoes not present):       Sensory-Monofilament:          Left foot: normal          Right foot: normal       Inspection:          Left  foot: normal          Right foot: normal       Nails:          Left foot: normal          Right foot: normal    Eye Exam:       Eye Exam done elsewhere          Date: 11/01/2009          Results: normal   Impression & Recommendations:  Problem # 1:  DIABETES MELLITUS, TYPE II (ICD-250.00) Assessment Improved Pt is exercising, she has lost more weight doing weight watchers, she is keeping very close measurements of her CBG's, she has gone to the DM ed classes. She will come back in Oct to get her A1c retested.   Her updated medication list for this problem includes:    Aspir-low 81 Mg Tbec (Aspirin) .Marland Kitchen... 1 once daily po    Metformin Hcl 500 Mg Tabs (Metformin hcl) .Marland Kitchen... 1 tab a day for 7 days, then increase to 1 tab in the morning and 1 in the evening for 7 days. then increase to 2 tablets twice a day.  Orders: [facility]- Est Level  3 (99213)Future Orders: Lipid-[facility] ([id number]) ...  02/14/2011 A1C-[facility] (82956) ... 02/20/2011  Problem # 2:  SKIN RASH (ICD-782.1) Assessment: Deteriorated Pt has a recurrent skin condition that she thinks is due to the simvastatin but appears to be lichen planus on a biopsy done by dermatology. I encouraged her to go back to derm while she has fresh lesions to show them. She agrees.   Orders: [facility]- Est Level  3 (99213)  Complete Medication List: 1)  Fish Oil 300 Mg Caps (Omega-3 fatty acids) .Marland Kitchen.. 1 tab by mouth once daily 2)  Aspir-low 81 Mg Tbec (Aspirin) .Marland Kitchen.. 1 once daily po 3)  Calcium-vitamin D 500-125 Mg-unit Tabs (Calcium-vitamin d) 4)  Naprosyn 500 Mg Tabs (Naproxen) .Marland Kitchen.. 1 two times a day 5)  Furosemide 20 Mg Tabs (Furosemide) .Marland Kitchen.. 1 tab by mouth daily or as needed for swelling in legs 6)  Lorcet 10/650 10-650 Mg Tabs (Hydrocodone-acetaminophen) .... As needed 7)  Alendronate Sodium 70 Mg Tabs (Alendronate sodium) .... One tab by mouth qweek 8)  Qc Daily Multivitamins/iron Tabs (Multiple vitamins-iron) 9)  Lovastatin 20 Mg Tabs (Lovastatin) .... Take 1 pill every evening. 10)  Metformin Hcl 500 Mg Tabs (Metformin hcl) .Marland Kitchen.. 1 tab a day for 7 days, then increase to 1 tab in the morning and 1 in the evening for 7 days. then increase to 2 tablets twice a day. 11)  Lancets Misc (Lancets) 12)  Prodigy Glucometer  13)  Prodigy Test Strips   Patient Instructions: 1)  Call the day before you want to get your labwork done.  2)  It was good to see you today.  3)  You have lost another 9.5 lbs.  Prescriptions: LOVASTATIN 20 MG TABS (LOVASTATIN) take 1 pill every evening.  #31 x 3   Entered and Authorized by:   Jamie Brookes MD   Signed by:   Jamie Brookes MD on 02/28/2010   Method used:   Electronically to        [facility] (retail)       [address]       [place], [place]  [address]       Ph: [phone]       Fax: [phone]  RxID:   [id number]    Prevention & Chronic  Care Immunizations   Influenza vaccine: Fluvax MCR  (03/07/2009)   Influenza vaccine due: 03/31/2008    Tetanus booster: 05/09/2006: Done.   Tetanus booster due: 05/09/2016    Pneumococcal vaccine: Not documented    H. zoster vaccine: Not documented  Colorectal Screening   Hemoccult: Not documented   Hemoccult action/deferral: Not indicated  (01/20/2009)    Colonoscopy: verbal report by pt: normal.  next due 10  years.  performed at office "accross the street"  (08/13/2006)   Colonoscopy due: 08/12/2016  Other Screening   Pap smear: Done.  (05/03/2002)   Pap smear due: Not Indicated    Mammogram: ASSESSMENT: Negative - BI-RADS 1^MM DIGITAL SCREENING  (09/25/2009)   Mammogram due: 09/19/2009    DXA bone density scan: Not documented   Smoking status: quit  (02/28/2010)  Diabetes Mellitus   HgbA1C: >14.0  (12/01/2009)    Eye exam: Not documented    Foot exam: yes  (02/28/2010)   High risk foot: Not documented   Foot care education: Not documented    Urine microalbumin/creatinine ratio: Not documented    Diabetes flowsheet reviewed?: Yes   Progress toward A1C goal: Unchanged  Lipids   Total Cholesterol: 152  (05/03/2009)   LDL: 78  (05/03/2009)   LDL Direct: 152  (11/28/2009)   HDL: 55  (05/03/2009)   Triglycerides: 94  (05/03/2009)    SGOT (AST): 15  (11/28/2009)   SGPT (ALT): 14  (11/28/2009)   Alkaline phosphatase: 87  (11/28/2009)   Total bilirubin: 0.3  (11/28/2009)    Lipid flowsheet reviewed?: Yes   Progress toward LDL goal: Unchanged  Self-Management Support :   Personal Goals (by the next clinic visit) :      Personal blood pressure goal: 140/90  (01/20/2009)     Personal LDL goal: 100  (01/20/2009)    Patient will work on the following items until the next clinic visit to reach self-care goals:     Medications and monitoring: take my medicines every day, check my blood sugar, bring all of my medications to every visit, weigh myself weekly,  examine my feet every day  (02/28/2010)     Eating: eat more vegetables, use fresh or frozen vegetables, eat foods that are low in salt  (02/28/2010)     Activity: take a 30 minute walk every day  (02/28/2010)    Diabetes self-management support: Not documented    Lipid self-management support: Not documented

## 2010-07-05 NOTE — Consult Note (Signed)
Summary: [facility]   Imported By: De Nurse 06/13/2009 15:17:50  _____________________________________________________________________  External Attachment:    Type:   Image     Comment:   External Document

## 2010-07-05 NOTE — Progress Notes (Signed)
  Phone Note Outgoing Call   Call placed by: Lequita Asal  MD,  November 29, 2009 7:46 AM Summary of Call: patient's cholesterol is >150, so she definitely needs to restart simvastatin. Also concerning was a random glucose of >300, concerning for possible diabetes. Pt needs to come back for A1C. please inform. order in and rx for simvastatin sent  Initial call taken by: Lequita Asal  MD,  November 29, 2009 7:47 AM  New Problems: DIABETES MELLITUS, BORDERLINE (ICD-790.29)   New Problems: DIABETES MELLITUS, BORDERLINE (ICD-790.29) Prescriptions: SIMVASTATIN 40 MG TABS (SIMVASTATIN) take one pill daily.  #31 x 6   Entered by:   Lequita Asal  MD   Authorized by:   Jamie Brookes MD   Signed by:   Lequita Asal  MD on 11/29/2009   Method used:   Electronically to        [facility] (retail)       [address]       [place], [place]  [address]       Ph: [phone]       Fax: [phone]   RxID:   [id number]  rx phoned in to [facility] [address] [place] pt notified.Loralee Pacas CMA  November 29, 2009 9:36 AM

## 2010-07-05 NOTE — Assessment & Plan Note (Signed)
Summary: new onset DM2   Vital Signs:  Patient profile:   60 year old female Height:      63 inches Weight:      283 pounds BMI:     50.31 Temp:     97.8 degrees F oral Pulse rate:   80 / minute Pulse rhythm:   regular BP sitting:   123 / 84  (left arm) Cuff size:   large  Vitals Entered By: Loralee Pacas CMA (December 01, 2009 9:53 AM)  Primary Care Provider:  Jamie Brookes MD  CC:  new onset DM2.  History of Present Illness: New onset DM2: Pt comes in today for new onset diabetes. Pt has had some elevated BG in the past and today she came back in to get an A1c. It was found to be 14%. I had extensive conversations with her about foods, a typical diabetes course, what insulin does etc... She has many questions and is looking forward to going to the DM education class.   Obestiy: Pt has been losing weight with weight watchers. She has been going to the classes and has decreased her weight from 318 to 283 today. She has some problems doing exercises because of her Rt knee. She is due to have knee surgery again in 3-4 months. Encouraged her to   Habits & Providers  Alcohol-Tobacco-Diet     Tobacco Status: never  Current Medications (verified): 1)  Fish Oil 300 Mg  Caps (Omega-3 Fatty Acids) .Marland Kitchen.. 1 Tab By Mouth Once Daily 2)  Aspir-Low 81 Mg Tbec (Aspirin) .Marland Kitchen.. 1 Once Daily Po 3)  Calcium-Vitamin D 500-125 Mg-Unit  Tabs (Calcium-Vitamin D) 4)  Naprosyn 500 Mg Tabs (Naproxen) .Marland Kitchen.. 1 Two Times A Day 5)  Furosemide 20 Mg  Tabs (Furosemide) .Marland Kitchen.. 1 Tab By Mouth Daily or As Needed For Swelling in Legs 6)  Lorcet 10/650 10-650 Mg  Tabs (Hydrocodone-Acetaminophen) .... As Needed 7)  Alendronate Sodium 70 Mg Tabs (Alendronate Sodium) .... One Tab By Mouth Qweek 8)  Qc Daily Multivitamins/iron  Tabs (Multiple Vitamins-Iron) 9)  Simvastatin 40 Mg Tabs (Simvastatin) .... Take One Pill Daily. 10)  Metformin Hcl 500 Mg Tabs (Metformin Hcl) .Marland Kitchen.. 1 Tab A Day For 7 Days, Then Increase To 1 Tab in  The Morning and 1 in The Evening For 7 Days. Then Increase To 2 Tablets Twice A Day. 11)  Lancets  Misc (Lancets) 12)  Prodigy Glucometer 13)  Prodigy Test Strips  Allergies (verified): 1)  ! Morphine  Social History: Smoking Status:  never  Review of Systems        vitals reviewed and pertinent negatives and positives seen in HPI   Physical Exam  General:  Well-developed,well-nourished,in no acute distress; alert,appropriate and cooperative throughout examination Psych:  pt very sad that she has been diagnosed with DM   Impression & Recommendations:  Problem # 1:  DIABETES MELLITUS, TYPE II (ICD-250.00) Assessment New Pt is being diagnosed with DM 2 today for the first time. She has had some borderline DM labs in the past but today her fasting CBG was in the 300's and her A1c was >14. Plan to start her on Metformin, send her to DM/nutrition classes and see her back in 1 month after she has gotten a chance to learn some things from the classes. Gave her handwritten Rx for prodigy meter, strips and lancets. Pt has lost weight with weight watchers and was encouraged to keep it up!  Her updated medication list  for this problem includes:    Aspir-low 81 Mg Tbec (Aspirin) .Marland Kitchen... 1 once daily po    Metformin Hcl 500 Mg Tabs (Metformin hcl) .Marland Kitchen... 1 tab a day for 7 days, then increase to 1 tab in the morning and 1 in the evening for 7 days. then increase to 2 tablets twice a day.  Orders: Nutrition Referral (Nutrition) [facility]- Est Level  3 ([address])  Complete Medication List: 1)  Fish Oil 300 Mg Caps (Omega-3 fatty acids) .Marland Kitchen.. 1 tab by mouth once daily 2)  Aspir-low 81 Mg Tbec (Aspirin) .Marland Kitchen.. 1 once daily po 3)  Calcium-vitamin D 500-125 Mg-unit Tabs (Calcium-vitamin d) 4)  Naprosyn 500 Mg Tabs (Naproxen) .Marland Kitchen.. 1 two times a day 5)  Furosemide 20 Mg Tabs (Furosemide) .Marland Kitchen.. 1 tab by mouth daily or as needed for swelling in legs 6)  Lorcet 10/650 10-650 Mg Tabs (Hydrocodone-acetaminophen) ....  As needed 7)  Alendronate Sodium 70 Mg Tabs (Alendronate sodium) .... One tab by mouth qweek 8)  Qc Daily Multivitamins/iron Tabs (Multiple vitamins-iron) 9)  Simvastatin 40 Mg Tabs (Simvastatin) .... Take one pill daily. 10)  Metformin Hcl 500 Mg Tabs (Metformin hcl) .Marland Kitchen.. 1 tab a day for 7 days, then increase to 1 tab in the morning and 1 in the evening for 7 days. then increase to 2 tablets twice a day. 11)  Lancets Misc (Lancets) 12)  Prodigy Glucometer  13)  Prodigy Test Strips   Patient Instructions: 1)  You have new onset diabetes.  2)  I am starting you on a medicine today.  3)  I am also giving your prescriptions to get set up with a meter.  4)  I am doing a referral to diabetes classes. They will call you to make an appointment.  5)  I want to see you back in 3-4 weeks to see how you are doing on the medicine.  Prescriptions: METFORMIN HCL 500 MG TABS (METFORMIN HCL) 1 tab a day for 7 days, then increase to 1 tab in the morning and 1 in the evening for 7 days. Then increase to 2 tablets twice a day.  #90 x 3   Entered and Authorized by:   Jamie Brookes MD   Signed by:   Jamie Brookes MD on 12/01/2009   Method used:   Electronically to        [facility] [address] (retail)       [address]       [place], [place]  [address]       Ph: [phone]       Fax: [phone]   RxID:   [id number]

## 2010-07-05 NOTE — Progress Notes (Signed)
Summary: phn ms  Phone Note Call from Patient Call back at [facility] Phone [phone]   Caller: Patient Summary of Call: Pt new onset of diabetes and is wondering what she is supposed to be doing.  Said that she was to be referred for several things, but does not know who is to contact her. Initial call taken by: Clydell Hakim,  December 05, 2009 3:04 PM  Follow-up for Phone Call        spoke with pt, advised that they would contact her about her diabetic class Archie Patten sent paperwork in on Fri), that if she does not hear from them by Navos PM to call us back and we would check on the referral, advised that she would learn how to program and use her meter in these class and that if Dr Clotilde Dieter wanted her to start using the meter before diabetic classes could start I would call her back, voiced understanding.Marland KitchenMarland KitchenTo Dr Clotilde Dieter Follow-up by: Gladstone Pih,  December 05, 2009 3:11 PM  Additional Follow-up for Phone Call Additional follow up Details #1::        i'm hoping that she will get into the DM classes soon so she can learn to use her meter. She doesn't need to do anything yet other than take her Metformin.  Additional Follow-up by: Jamie Brookes MD,  December 06, 2009 8:50 AM    Additional Follow-up for Phone Call Additional follow up Details #2::    spoke with pt has been taking Metformin since Sat, has supplies for when diabetic ed calls her. Follow-up by: Gladstone Pih,  December 06, 2009 10:44 AM

## 2010-07-05 NOTE — Assessment & Plan Note (Signed)
Summary: itchy,scaly bump on left wrist   Vital Signs:  Patient profile:   60 year old female Height:      63 inches Weight:      307.44 pounds BMI:     54.66 Temp:     97.5 degrees F oral Pulse rate:   82 / minute BP sitting:   120 / 76  (right arm)  Vitals Entered By: Arland Morel (February 21, 2009 10:49 AM) CC: rash on left wrist Is Patient Diabetic? No Pain Assessment Patient in pain? no        Primary Care Provider:  Jeoffrey Anger MD  CC:  rash on left wrist.  History of Present Illness: Couple week hx scaly, itchy lesion on ant left wrist.  Has been scratching it lots.  No contacts with tinea.  Habits & Providers  Alcohol-Tobacco-Diet     Tobacco Status: quit > 6 months  Allergies: No Known Drug Allergies  Social History: Smoking Status:  quit > 6 months  Review of Systems       See HPI  Physical Exam  General:  Well-developed,well-nourished,in no acute distress; alert,appropriate and cooperative throughout examination Skin:  Left anterior wrist with 1cm x 1cm scaled circular lesion.  Pruritic.  Additional Exam:  Lesion scraped and KOH prep performed.  No hyphae seen.    Impression & Recommendations:  Problem # 1:  TINEA MANUUM (ICD-110.2) Assessment New Will still treat with terbinafine cream two times a day applied with occlusion for 2 weeks.  RTC for recheck at that point.  Orders: [facility]- Est Level  3 ([address]213)  Complete Medication List: 1)  Fish Oil 300 Mg Caps (Omega-3 fatty acids) .SABRA.. 1 tab by mouth once daily 2)  Aspir-low 81 Mg Tbec (Aspirin ) .SABRA.. 1 once daily po 3)  Calcium -vitamin D  500-125 Mg-unit Tabs (Calcium -vitamin d ) 4)  Naprosyn 500 Mg Tabs (Naproxen) .SABRA.. 1 two times a day 5)  Furosemide 20 Mg Tabs (Furosemide) .SABRA.. 1 tab by mouth daily or as needed for swelling in legs 6)  Lorcet 10/650 10-650 Mg Tabs (Hydrocodone-acetaminophen ) .... As needed 7)  Alendronate  Sodium 70 Mg Tabs (Alendronate  sodium) .... One tab by mouth  qweek 8)  Polytrim 10000-0.1 Unit/ml-% Soln (Polymyxin b-trimethoprim) .... One group os q4 x7 days. 9)  Qc Daily Multivitamins/iron Tabs (Multiple vitamins-iron) 10)  Simvastatin 40 Mg Tabs (Simvastatin) .... Take one pill daily. 11)  Terbinafine Hcl 1 % Crea (Terbinafine hcl) .... Apply to affected area twice daily for 2 weeks, occlude with tegaderm or saran wap.  Patient Instructions: 1)  This looks like a fungal infection. 2)  Apply the terbinafine cream to the affected area 2x a day, occlude with tegaderm dressings or saran wrap, do this for 2 weeks, then come back to see me. 3)  -Dr. ONEIDA. Prescriptions: TERBINAFINE HCL 1 % CREA (TERBINAFINE HCL) Apply to affected area twice daily for 2 weeks, occlude with tegaderm or saran wap.  #1 tube x 0   Entered and Authorized by:   Debby Petties MD   Signed by:   Debby Petties MD on 02/21/2009   Method used:   Electronically to        [facility] (retail)       [address]       [place], [place]  [address]       Ph: [phone]       Fax: [phone]   RxID:  [id number]  

## 2010-07-05 NOTE — Miscellaneous (Signed)
Summary: metformin 1000mg  BId  Clinical Lists Changes  Medications: Changed medication from METFORMIN HCL 500 MG TABS (METFORMIN HCL) 1 tab a day for 7 days, then increase to 1 tab in the morning and 1 in the evening for 7 days. Then increase to 2 tablets twice a day. to METFORMIN HCL 1000 MG TABS (METFORMIN HCL) 1 tablet every morning and 1 tablet every evening. Do not take if you are dehydrated. - Signed Rx of METFORMIN HCL 1000 MG TABS (METFORMIN HCL) 1 tablet every morning and 1 tablet every evening. Do not take if you are dehydrated.;  #180 x 3;  Signed;  Entered by: Jamie Brookes MD;  Authorized by: Jamie Brookes MD;  Method used: Electronically to [facility] [address] [place], [place], [place]  [address], Ph: [phone], Fax: [phone]    Prescriptions: METFORMIN HCL 1000 MG TABS (METFORMIN HCL) 1 tablet every morning and 1 tablet every evening. Do not take if you are dehydrated.  #180 x 3   Entered and Authorized by:   Jamie Brookes MD   Signed by:   Jamie Brookes MD on 03/13/2010   Method used:   Electronically to        [facility] (retail)       [address]       [place], [place]  [address]       Ph: [phone]       Fax: [phone]   RxID:   [id number]

## 2010-07-11 NOTE — Progress Notes (Signed)
Summary: Lovastatin refill  Phone Note Refill Request   Refills Requested: Medication #1:  LOVASTATIN 20 MG TABS take 1 pill every evening. Ms. Cerino need a refill on med/  Initial call taken by: Abundio Miu,  July 04, 2010 2:11 PM  Follow-up for Phone Call        will do it now. THanks.  Follow-up by: Jamie Brookes MD,  July 04, 2010 9:08 PM    New/Updated Medications: LOVASTATIN 20 MG TABS (LOVASTATIN) take 1 pill every evening. Prescriptions: LOVASTATIN 20 MG TABS (LOVASTATIN) take 1 pill every evening.  #30 x 5   Entered and Authorized by:   Jamie Brookes MD   Signed by:   Jamie Brookes MD on 07/04/2010   Method used:   Electronically to        [facility] (retail)       [address]       [place], [place]  [address]       Ph: [phone]       Fax: [phone]   RxID:   [id number]

## 2010-08-18 LAB — GLUCOSE, CAPILLARY: Glucose-Capillary: 116 mg/dL — ABNORMAL HIGH (ref 70–99)

## 2010-08-23 ENCOUNTER — Other Ambulatory Visit: Payer: Self-pay | Admitting: Family Medicine

## 2010-08-23 MED ORDER — METFORMIN HCL 500 MG PO TABS: 500.0000 mg | ORAL_TABLET | Freq: Two times a day (BID) | ORAL | Status: DC

## 2010-08-27 ENCOUNTER — Other Ambulatory Visit: Payer: Self-pay | Admitting: Family Medicine

## 2010-08-27 MED ORDER — METFORMIN HCL 500 MG PO TABS: 500.0000 mg | ORAL_TABLET | Freq: Two times a day (BID) | ORAL | Status: DC

## 2010-08-29 ENCOUNTER — Ambulatory Visit (INDEPENDENT_AMBULATORY_CARE_PROVIDER_SITE_OTHER): Payer: Medicare HMO | Admitting: Family Medicine

## 2010-08-29 ENCOUNTER — Encounter: Payer: Self-pay | Admitting: Family Medicine

## 2010-08-29 VITALS — BP 106/69 | HR 81 | Temp 98.3°F | Ht 63.0 in | Wt 265.4 lb

## 2010-08-29 DIAGNOSIS — I10 Essential (primary) hypertension: Secondary | ICD-10-CM | POA: Insufficient documentation

## 2010-08-29 DIAGNOSIS — E119 Type 2 diabetes mellitus without complications: Secondary | ICD-10-CM

## 2010-08-29 LAB — BASIC METABOLIC PANEL
BUN: 17 mg/dL (ref 6–23)
Chloride: 108 mEq/L (ref 96–112)
Potassium: 4.1 mEq/L (ref 3.5–5.3)
Sodium: 141 mEq/L (ref 135–145)

## 2010-08-29 LAB — POCT GLYCOSYLATED HEMOGLOBIN (HGB A1C): Hemoglobin A1C: 6.2

## 2010-08-29 NOTE — Patient Instructions (Addendum)
Try getting some compression socks from a running store.  The one I know about is called "[employer]" off of [address] Your A1c was 6.2. We can leave you off Metformin for 3 months and recheck the end of June.  If it is higher than 6.2 at that time we will need to restart the metformin.  You are getting a lab test today to check your potassium.

## 2010-08-30 ENCOUNTER — Other Ambulatory Visit: Payer: Self-pay | Admitting: Orthopedic Surgery

## 2010-08-30 ENCOUNTER — Ambulatory Visit
Admission: RE | Admit: 2010-08-30 | Discharge: 2010-08-30 | Disposition: A | Discharge status: Home / Self Care | Payer: Medicare HMO | Source: Ambulatory Visit | Attending: Orthopedic Surgery | Admitting: Orthopedic Surgery

## 2010-08-30 DIAGNOSIS — M5430 Sciatica, unspecified side: Secondary | ICD-10-CM

## 2010-09-02 NOTE — Assessment & Plan Note (Signed)
Pt's CBG's are in a good range. Her A1c is 6.2. Will check BMET today to assess kidney function.

## 2010-09-02 NOTE — Assessment & Plan Note (Signed)
BP well controlled. Will continue on Lasix. Will check BMET to assess kidney function and electrolytes.

## 2010-09-02 NOTE — Progress Notes (Signed)
DM2:  Pt has well controlled DM2. She is currently just taking metformin. Her A1c today is 6.2 Her CBG's have been between 98-118 in the last week and the highest in march was 118. Yesterday her am CBG was 111.  She is checking her glucose regularly. She is working out 5 days a week. She alternates between taking 10K and 18 K steps every day.  HTN: PT has a h/o HTN . Her BP today is 106/69. SHe is well controlled on LAsix which she also uses for lower extremity swelling. She is not checking her BP daily.   ROS:  Neg except as noted in HPI. She is not having any difficutly with any of her medications. No hypoglycemia, no fevers, or chills.   PE:  Gen: no acute distress, sitting comfortably in chair. HEENT: [place]/AT, EOMI, PERR CV: RRR, no murmur Pulm: CTAB, no wheezes or crackles Ext: minimal swelling, diabetic foot exam is neg for toenail thickening Bilaterally, normal sensation with filament testing bilatearlly.

## 2010-09-10 ENCOUNTER — Other Ambulatory Visit: Payer: Self-pay | Admitting: Family Medicine

## 2010-09-10 DIAGNOSIS — Z1231 Encounter for screening mammogram for malignant neoplasm of breast: Secondary | ICD-10-CM

## 2010-09-27 ENCOUNTER — Ambulatory Visit (HOSPITAL_COMMUNITY)
Admission: RE | Admit: 2010-09-27 | Discharge: 2010-09-27 | Disposition: A | Discharge status: Home / Self Care | Payer: Medicare HMO | Source: Ambulatory Visit | Attending: Family Medicine | Admitting: Family Medicine

## 2010-09-27 DIAGNOSIS — Z1231 Encounter for screening mammogram for malignant neoplasm of breast: Secondary | ICD-10-CM | POA: Insufficient documentation

## 2010-10-16 NOTE — Op Note (Signed)
[name], [name]                  ACCOUNT NO.:  [id number]   MEDICAL RECORD NO.:  [id number]          PATIENT TYPE:  OIB   LOCATION:  5001                         FACILITY:  [facility]   PHYSICIAN:  Myrtie Neither, MD      DATE OF BIRTH:  Jan 23, 1951   DATE OF PROCEDURE:  05/18/2007  DATE OF DISCHARGE:  05/19/2007                               OPERATIVE REPORT   PREOPERATIVE DIAGNOSIS:  Painful right total knee.   POSTOPERATIVE DIAGNOSIS:  Painful right total knee, chronic synovitis  with thickened scarring.   ANESTHESIA:  General.   PROCEDURE:  Arthroscopic synovectomy right knee, cultures aerobic and  anaerobic right knee.   The patient was taken to the operating room after given adequate preop  medications, given general anesthesia and sedated.  Right knee was  prepped with DuraPrep and draped in sterile manner.  Tourniquet used for  hemostasis.  One-half inch puncture wound made along the anterior,  medial and lateral joint line.  Inflow was through the medial  suprapatellar pouch area.  Inspection of the knee revealed tremendously  hypertrophic fibrotic scarring, some appearing collagenized anteriorly,  anteromedially.  The implant itself appeared to be intact.  Complete  synovectomy was done.  Prior to the arthroscopy, arthrocentesis was done  with the use of sterile saline and fluid was sent for aerobic and  anaerobic cultures.  After adequate synovectomy and irrigation wound  closure was then done with zero nylon.  Compressive dressing was  applied.  The patient tolerated procedure quite well and went to the  recovery room in satisfactory condition.  The patient being kept 23-hour  observation and pain control.  The patient will be discharged in a.m. on  Percocet one to two q.4, p.r.n. for pain.  Partial weightbearing with  use of walker and to return to the office in 1 week.  The patient being  discharged  in stable and satisfactory condition.      Myrtie Neither, MD  Electronically Signed     AC/MEDQ  D:  06/02/2007  T:  06/03/2007  Job:  [id number]

## 2010-10-19 NOTE — Discharge Summary (Signed)
[name], [name]                  ACCOUNT NO.:  [id number]   MEDICAL RECORD NO.:  [id number]          PATIENT TYPE:  INP   LOCATION:  5004                         FACILITY:  [facility]   PHYSICIAN:  Myrtie Neither, MD      DATE OF BIRTH:  May 01, 1951   DATE OF ADMISSION:  03/28/2005  DATE OF DISCHARGE:  04/02/2005                                 DISCHARGE SUMMARY   ADMITTING DIAGNOSIS:  Painful right total knee.   DISCHARGE DIAGNOSIS:  Infection, right total knee.   COMPLICATIONS:  None.   OPERATION:  Removal of right total knee implant and placement of bone cement  block impregnated with vancomycin.   PERTINENT HISTORY:  This is a 60 year old female, who had right total knee  arthroplasty several months ago.  Patient returned back to the office with a  MRSA infection, which was treated with debridement.  The patient progressed  fairly well and was treated with IV vancomycin for 8 weeks.  The patient's C-  reactive protein was followed, which came down to normal.  Sed rate was  normal.  White cell count was normal.  Patient had a PICC line and  discontinuation of the IV antibiotics.  The patient progressed fairly well.  She started on ambulation with use of walker and then to quad cane.  The  patient continued to have pain in the right knee more recently in the past  few weeks.  Repeat of her C-reactive protein showed some increase in the C-  reactive protein above normal level.  Patient is being admitted for  evaluation and possible removal of the right total knee implant.   PERTINENT PHYSICAL:  RIGHT KNEE:  Tender anterolaterally, suprapatellar  pouch area.  Range-of-motion, full extension, flexion is 85 degrees.  Negative Homans test, no increased warmth or discoloration.  The patient has  also been treated for phlebitis with the use of Coumadin.   HOSPITAL COURSE:  Patient underwent I&D and removal of total knee implant on  March 28, 2005.  The patient had cultures done, started  back on  vancomycin.  The patient had a PICC line put in place and was able to be  discharged with PICC line to continue the vancomycin at home, Percocet  10/650, nonweightbearing on the right side.  The patient is to return to the  office in one week.  The patient was discharged in stable and satisfactory  condition.      Myrtie Neither, MD  Electronically Signed     AC/MEDQ  D:  05/18/2005  T:  05/21/2005  Job:  [id number]

## 2010-10-19 NOTE — Op Note (Signed)
NAMENERA, HAWORTH                  ACCOUNT NO.:  [id number]   MEDICAL RECORD NO.:  [id number]          PATIENT TYPE:  INP   LOCATION:  1607                         FACILITY:  [facility]   PHYSICIAN:  Myrtie Neither, MD      DATE OF BIRTH:  Jan 22, 1951   DATE OF PROCEDURE:  10/02/2006  DATE OF DISCHARGE:  10/06/2006                               OPERATIVE REPORT   PREOPERATIVE DIAGNOSIS:  Painful loose right total knee tibial  component.   POSTOPERATIVE DIAGNOSIS:  Painful loose right total knee tibial  component.   ANESTHESIA:  General.   PROCEDURE:  Right total knee revision tibial component revision.  Also,  screw fixation of tibial fracture.   IMPLANT:  Biomet implant.   The patient was taken to the operating room and after given adequate  preoperative medication given general anesthesia and intubated.  The  right knee was prepped with DuraPrep and draped in a sterile manner.  Tourniquet and Bovie used for hemostasis.  An anterior midline incision  was made on the right knee going through the old previous scar, going  through the skin and subcutaneous tissue down to the fascia.  Medial  paramedian incision made into the capsule, extended up to the  quadriceps, down to the tibial tuberosity.  Fibrotic scar tissue was  released both sharply and bluntly.  Cultures aerobic and anaerobic were  done and Gram stains were done which were negative.  The knee was taken  into a flexed position, medial patella was reflected laterally.  Inspection of the patella revealed it was intact.  Femoral component was  intact.  Tibial component showed some loosening.  Polyethylene was  removed.  After prolonged difficulty in loosening the cement from the  proximal tibial component, the tibial component was finally able to be  removed.  Excess methylmethacrylate down the tibial shaft was also  identified and removed with the reverse reamer and curettes.  Copious  and abundant irrigation was done.  The  tibial surface had small  fractures medially.  This was fixated with a transfixing cortical screw.  The tibial surface was resected, freshening up the tibial surface.  Copious and abundant irrigation was done.  Trial implant was used an a  14-mm poly was found to be the most stable.  This allowed full flexion,  full extension, good medial and lateral stability.  Lateral release of  the patella was necessary due to lateral tracking of the patella.  Next,  methylmethacrylate was mixed.  Tibial component was cemented.  Excess  methylmethacrylate was removed.  With the trial poly in place, the knee  was held in the extended position until it was fully set.  Inspection  did not reveal any other loose methylmethacrylate.  Copious and abundant  irrigation was done.  Final polyethylene tibial component was put in  place and locked with a key.  Again, full flexion, full extension, good  medial and lateral stability, and good tracking of the patella.  Hemovac  drain was placed into the wound, the tourniquet was let down, hemostasis  obtained.  Wound closure  was then done with 0  Vicryl for the fascia, 2-0 for the subcutaneous, and skin staples for  the skin.  Bulky compressive dressings applied, knee immobilizer  applied.  The patient tolerated the procedure quite well and went to the  recovery room in stable and satisfactory condition.      Myrtie Neither, MD  Electronically Signed     AC/MEDQ  D:  10/06/2006  T:  10/06/2006  Job:  [id number]

## 2010-10-19 NOTE — H&P (Signed)
[place]  Patient:    [name]                          MRN: [id number] Adm. Date:  [id number] Disc. Date: [id number] Attending:  Wende Mott                         History and Physical  CHIEF COMPLAINT:  Painful right knee.  HISTORY OF PRESENT ILLNESS:  This is a 60 year old female who had sustained severe injury to her right knee from a fall from a ladder and underwent open reduction and internal fixation with tibial buttress plate and screws.  The patient has progressed fairly well, but two of the screws have penetrated medially causing pressure up against the pes anserinus bursal sac causing bursitis and pain.,  PAST MEDICAL HISTORY:  The patient has history of cholecystectomy, hysterectomy, and arthroscopy of right knee, ORIF of right knee.  The patient also has history of high blood pressure.  ALLERGIES:  ______ and MORPHINE causing rashes.  MEDICATIONS: 1. Celebrex 200 daily. 2. Hydrochlorothiazide 50 mg daily. 3. ______ 1 q.4h. p.r.n. for pain. 4. Metabolite daily.  HABITS:  None.  FAMILY HISTORY:  Noncontributory.  REVIEW OF SYSTEMS:  Basically that of History of Present Illness.  No cardiac, respiratory, urinary, or bowel symptoms.  PHYSICAL EXAMINATION:  GENERAL:  Alert and oriented in no acute distress, antalgic gait with limp on right side with use of walker.  VITAL SIGNS:  Temperature 97.1, pulse 103, respirations 16, blood pressure 150/72. Height 5 feet 3 inches, weight 285.  HEENT:  Head normocephalic.  Eyes: Conjunctivae and sclerae clear.  NECK:  Supple.  CHEST:  Clear.  CARDIAC:  S1 and S2 regular.  EXTREMITIES:  Right knee ulcer with scar anterolaterally.  Markedly tender. Palpable screw end medially.  Range of motion limited due to pain on attempt at  flexion or extension.  Neurovascular status intact.  IMPRESSION:  Painful hardware, right tibia. DD:  07/17/99 TD:  07/17/99 Job:  [id number]

## 2010-10-19 NOTE — Op Note (Signed)
NAMEMEERAB, MASELLI                  ACCOUNT NO.:  [id number]   MEDICAL RECORD NO.:  [id number]          PATIENT TYPE:  INP   LOCATION:  5004                         FACILITY:  [facility]   PHYSICIAN:  Myrtie Neither, MD      DATE OF BIRTH:  03/18/1951   DATE OF PROCEDURE:  11/17/2004  DATE OF DISCHARGE:                                 OPERATIVE REPORT   PREOPERATIVE DIAGNOSIS:  Hematoma right knee, status post right total knee.   POSTOPERATIVE DIAGNOSIS:  Hematoma right knee, status post right total knee.   ANESTHESIA:  General.   PROCEDURE:  Irrigation and debridement, evacuation of hematoma right knee,  culture and sensitivity aerobic and anaerobic, and placement of drain.   The patient was taken to the operating room after being given adequate  premedication, given general anesthesia and intubated.  Staples were removed  from the right knee.  The right knee was scrubbed with Betadine scrub,  painted with Betadine solution, and draped in a sterile manner.  Tourniquet  was not used.  A midline incision going through the old previous scar was  made over the right knee.  Blood clot and hematoma was evacuated.  Copious  irrigation was done with Simpulse irrigation system, with normal saline as  well as with antibiotic solution.  Cultures aerobic and anaerobic were done  as well as Gram's stain.  After adequate debridement and irrigation of the  area, Hemovac drain was placed into the knee.  Wound closure was then done  with 2-0 Vicryl in the subcutaneous and skin staples to the skin.  Bulky  compressive dressing was applied.  Knee immobilizer applied.  The patient  tolerated the procedure quite well and went to the recovery room in stable  satisfactory condition.       AC/MEDQ  D:  11/21/2004  T:  11/21/2004  Job:  [id number]

## 2010-10-19 NOTE — Op Note (Signed)
NAMEMAKENSIE, MULHALL                  ACCOUNT NO.:  [id number]   MEDICAL RECORD NO.:  [id number]          PATIENT TYPE:  INP   LOCATION:  5040                         FACILITY:  [facility]   PHYSICIAN:  Myrtie Neither, MD      DATE OF BIRTH:  [birth year]   DATE OF PROCEDURE:  09/05/2005  DATE OF DISCHARGE:                                 OPERATIVE REPORT   PREOPERATIVE DIAGNOSIS:  Right total knee revision, status post infection  with bone cement.   POSTOPERATIVE DIAGNOSIS:  Right total knee revision, status post infection  with bone cement.   ANESTHESIA:  General.   OPERATION/PROCEDURE:  Right total knee revision with removal of bone cement  and placement of implant__________.   ANESTHESIA:  General.   HEMOSTASIS:  Tourniquet and Bovie.   TOURNIQUET TIME:  30 minutes.   DESCRIPTION OF PROCEDURE:  The patient was taken to the operating room and  was given after being given adequate premedication and was given general  anesthesia and intubated.  Right lower leg was prepped with DuraPrep and  draped in a sterile manner.  Tourniquet and Bovie used for hemostasis.  Anterior midline incision made over the right knee going through the old  previous scar going down through the skin and subcutaneous tissue down  through the fascia.  Medial paramedian incision made into the capsule.  Thick fibrotic tissue was present. Cultures, aerobic and anaerobic, and Gram  stain were done.  No visible evidence of infection was present.  Extensive  soft tissue release of the fibrotic tissue both medial and lateral  compartments, suprapatellar pouch area with a quadriceps that bound down to  the femur  and both posteriorly release the capsule from the femur after  prolonged and meticulous dissection to get femur, tibia and patella freed  up.  Initial examination of the femur, tibia and patella revealed good bone  substance.  The tibia had tremendously hard sclerotic and medial compartment  surface and 2 mm  were resected from the tibia using the tibial guide.  Next,  attention was turned to the femur.  Femoral sizing was that of 65 mm femoral  component.  Cutting jig was put in place. Anterior, posterior and chamfer  cuts were made.  No notching of the femur was noted.  Debridement of the  patella was __________ with the fibrotic tissue was done and light  resurfacing of the patella with an oscillating saw was done.  Femoral  component was put in place, 65 mm trial component, tibial surfacing was at 7  and 1 mm. Tibial bearing was a 12 mm x 71 mm trial component with two 6 mm  Augmentin blocks both medially and laterally.  With trial components put in  place the knee was brought into full extension and flexion up to 120.  Lateral release was done due to bolts of tightness of the joint as well as  lateral subluxing of the patella.  The patella button was at a 34 mm.  With  all three components put in place, there was good medial and lateral  stability for extension and flexion was 120.  No lateral subluxation of the  patella. Next, all components were removed and irrigation was done.  __________ was done.  Mixing of the cement was done.  The tibial, patella  and femoral components were also cemented.  The final implants used were 65  mm femoral component, two 6 mm thick Augmentin blocks, constrained tibial  bearing surface, 12 mm x 71 to 75 mm, maximum stem tibial plate 71 mm,  34  mm patella component.  With all components put in place, excess methacrylate  was removed after the cement had set.  Copious irrigation was done.  Again  range of motion, both flexion and full extension with medial and lateral  stability.  Good tracking of the patella.  Hemostasis was obtained.  Tourniquet did not work very well, lasted only about 30 minutes during the  procedure.  The procedure itself lasted about 5-1/2 hours.  Blood loss was  2300 mL.  The patient did receive four units of packed  cells during the   procedure.  Wound closure was then donewith 0 Vicryl for the fascia, 2-0 for  the subcutaneous and skin staples for the skin.  Large Hemovac drain was  placed into the wound.  Compressive dressing was applied.  Knee immobilizer  was applied.  The patient tolerated the procedure quite well and went to the  recovery room in stable and satisfactory condition.      Myrtie Neither, MD  Electronically Signed     AC/MEDQ  D:  09/10/2005  T:  09/10/2005  Job:  [id number]

## 2010-10-19 NOTE — Discharge Summary (Signed)
NAMEDENEISHA, DADE                  ACCOUNT NO.:  [id number]   MEDICAL RECORD NO.:  [id number]          PATIENT TYPE:  INP   LOCATION:  5021                         FACILITY:  [facility]   PHYSICIAN:  Myrtie Neither, MD      DATE OF BIRTH:  [birth year]   DATE OF ADMISSION:  09/22/2005  DATE OF DISCHARGE:  09/29/2005                                 DISCHARGE SUMMARY   ADMITTING DIAGNOSES:  1.  Hemarthrosis right knee, rule out infection.  2.  Sciatica, rule out herniated nucleus pulposus.   DISCHARGE DIAGNOSES:  1.  Hemarthrosis right knee, rule out infection.  2.  Sciatica, rule out herniated nucleus pulposus.   COMPLICATIONS:  None.   OPERATION:  I&D right knee and drain placement right knee on September 25, 2005.   PERTINENT HISTORY:  This is a 60 year old with total knee revision and was  started on weightbearing as tolerated and was on Coumadin therapy as an  outpatient.  The patient developed right thigh pain and swelling.  Was seen  back in the office and was found to have a right swollen knee.   Pertinent physical was that of the right knee swollen, tense, tender mid  thigh anteriorly in the proximal tibial area.  Positive right sciatic notch  test.  Negative well leg test.  Negative Homans test.  Tender posterior  right thigh and lower lumbar area.  Extensor hallucis intact.  No clonus.  __________ mid to lower lumbar and right sciatic notch.   HOSPITAL COURSE:  The patient had aspiration right knee followed by IV  antibiotics and anti-inflammatory, flexion of the knee at the bed, Skelaxin  muscle relaxant.  The patient's sciatica pain subsided.  Right knee swelling  persisted.  The patient's INR was high but was coming down.  The patient's  INR came down well enough to do I&D.  The patient underwent I&D on  09/25/2005 and had evacuation of  large hematoma.  The patient did receive a  transfusion.  Coumadin therapy was discontinued.   Postop course remained afebrile.  Cultures  did not demonstrate any bacteria.  No growth.  The patient became stable enough to be discharged with the use  of  a walker, Percocet 10/650, Flexeril 10 mg b.i.d., Feosol 300 mg t.i.d.  Home health and PT arranged.  The patient was discharged to return to the  office in 1 week.      Myrtie Neither, MD  Electronically Signed     AC/MEDQ  D:  11/20/2005  T:  11/20/2005  Job:  [id number]

## 2010-10-19 NOTE — Op Note (Signed)
[name], [name]                  ACCOUNT NO.:  [id number]   MEDICAL RECORD NO.:  [id number]          PATIENT TYPE:  INP   LOCATION:  2550                         FACILITY:  [facility]   PHYSICIAN:  Myrtie Neither, MD      DATE OF BIRTH:  1950/12/12   DATE OF PROCEDURE:  11/07/2004  DATE OF DISCHARGE:                                 OPERATIVE REPORT   PREOPERATIVE DIAGNOSIS:  Degenerative arthritis right knee.   POSTOPERATIVE DIAGNOSIS:  Degenerative arthritis right knee.   ANESTHESIA:  General anesthesia.   PROCEDURE:  Right total knee arthroplasty,  Biomet implant.   Patient was taken to the operating room after given adequate preoperative  medications, given general anesthesia and intubated.  Right knee was prepped  with DuraPrep and draped in usual sterile manner.  Tourniquet and Bovie used  for hemostasis.  Anterior midline incision made over the right knee going  through the skin and subcutaneous tissue down to the fascia.  A medial  paramedian incision made from the quadriceps down to the tibial tuberosity.  Patella was reflected laterally.  Osteophytes about the patella and tibia  and femur were resected.  Soft tissue resection was done due to varus  angulation.  A medial soft tissue release was also done.  After adequate  soft tissue release, tibial plateau cutting jig was put in place and tibial  surface was resected.  Bone cyst was identified in the center portion of the  proximal tibia.  This was debrided.  Next, attention was turned to the  femur.  Reaming was done down the femoral canal.  Distal femoral cutting jig  was put in place.  This was set at 6 degrees of valgus.  Next, sizing was  done which was found to be 65 mm.  A 65 mm cutting jig was then put in  place.  Anterior and posterior cuts were made and chamfering cuts.  Trial  femoral component was put in place and found to fit very snug.  Tibial  guides were then put in place and 75 mm showed good coverage.  With  the 75  mm guide put in place, alignment was found to be very good.  Markings were  made for the alignment.  Then appropriate cutting jig for the tibial surface  was put in place.  After these cuts were made, femoral and tibial components  were put in place with 12 mm poly followed by 14 mm poly which was found to  be more stable.  Patella sizing was that of medium size patella.  Cutting  jig was put in place, 22 mm for the patella cut.  This was made and the soft  tissue about the patella resection was done.  All three components were put  in place.  Patella did have some override laterally.  Lateral release was  done.  Trial components were put in place other than poly.  Methyl  methacrylate was used to cement the patella and the tibial component after  excess poly was resected.  The femoral component was press fit.  Again,  range of motion, full extension.  Good medial and lateral stability which  was better with a 60 mm poly.  A 60 mm was selected, locked in place and  again good medial and lateral stability, good anterior and posterior  stability.  Full extension, full flexion without lateral subluxation of the  patella.  Copious irrigation was done.  Hemostasis obtained with the  tourniquet  down.  Wound closure was then done with 0 Vicryl for the fascia, 2-0 for the  subcutaneous, skin staples for the skin.  Bulky compressive dressing was  applied.  Knee immobilizer applied.  Patient tolerated the procedure quite  well and went to the recovery room in stable and satisfactory condition.       AC/MEDQ  D:  11/07/2004  T:  11/07/2004  Job:  [id number]

## 2010-10-19 NOTE — H&P (Signed)
[name], [name]                  ACCOUNT NO.:  [id number]   MEDICAL RECORD NO.:  [id number]          PATIENT TYPE:  INP   LOCATION:  5021                         FACILITY:  [facility]   PHYSICIAN:  Myrtie Neither, MD      DATE OF BIRTH:  1950/10/04   DATE OF ADMISSION:  09/22/2005  DATE OF DISCHARGE:                                HISTORY & PHYSICAL   CHIEF COMPLAINT:  Severe onset of pain, at approximately 1:00 a.m. this  morning in the right knee.  Unable to bear weight.   HISTORY OF PRESENT ILLNESS:  This is a 60 year old female who approximately  three weeks ago underwent right total knee revision with history of MRSA  approximately eight months ago . The patient underwent right total knee  revision.  Had cultures done which were negative.  The patient had been  doing quite well with up to 90 degrees of flexion, full extension, and  progress to weightbearing as tolerated this past Friday.  Had sudden onset  of severe pain, anterior right thigh and proximal knee area.  At  approximately 1:00 a.m., patient states that she got up to go to the  bathroom and was able to go by herself.  Got back to the bed and developed  severe pain.  The patient also describes severe pain running from her  posterior right hip down to the right foot and ankle area with tingling,  numbness, and tightness sensation around the ankle.  Patient states that she  has had lower back discomfort for approximately two weeks but nothing like  this.  Patient denies any dysuria or frequency.  No fever, chills, or night  sweats.  The patient was brought to [facility] emergency room.  The patient  describes the pain along the posterolateral aspect of the right thigh going  down the leg with a burning sensation.  The patient recently was seen in the  office this past Friday, and her INR checked for her Coumadin therapy showed  that she had spiked a 3.7 on Monday and states she had some nosebleeds and  was instructed to  decrease her Coumadin.  The patient had repeat INR on  Wednesday, which was down to 2.  The patient was seen Friday on an emergency  basis because she developed some swelling at the distal area of the scar,  thinking it was a blister, which was not present before.  The patient was  seen and found to have a small hematoma at the distal end of the scar and  was instructed to use ice and have staples removed from the knee at that  time.   PAST MEDICAL HISTORY:  1.  Fractured right tibial plateau, ORIF, removal of hardware, right tibial      plateau.  2.  Arthroscopy, right knee.  3.  Right total knee arthroplasty, followed by MRSA infection, and removal      of right total knee and placement of bone cement and three months of IV      vancomycin and Rifampin.  The patient progressed quite well  with      weightbearing with the bone cement and had been off of IV antibiotics      approximately three months with no signs of infection.  4.  Hysterectomy in 1975.   ALLERGIES:  1.  MORPHINE.  2.  DILAUDID.  3.  CLINORIL.   MEDICATIONS:  Lasix, Percocet, Ultram ER 200, Coumadin.   FAMILY HISTORY:  Noncontributory.   REVIEW OF SYSTEMS:  Basically as in the history of present illness.  Also,  low back pain.  No cardiac, respiratory, and no urinary bowel symptoms.   SOCIAL HISTORY:  Negative for alcohol, tobacco, or illegal drugs.   PHYSICAL EXAMINATION:  VITAL SIGNS:  Temperature 98.7, blood pressure  104/52, pulse 125, respirations 24.  O2 saturation 96 on room air.  GENERAL:  Alert and oriented, in acute distress with pain in the right lower  extremity.  HEENT:  Normocephalic.  Conjunctivae are clear.  NECK:  Supple.  CHEST:  Clear.  CARDIAC:  S1 and S2 regular.  EXTREMITIES:  Right knee swollen, tense.  Tender mid thigh anteriorly and in  the proximal tibial area.  Slight increased warmth along the incision area  proximally.  Palpable swelling over the pes anserinus, proximal tibial  area.  Positive right sciatic notch/tendon, mid to low lumbar area.  Negative well  leg test.  Negative Homans' test.  Tender posterior right thigh and lower  lumbar.  His __________ is intact.  No clonus.   X-ray revealed right hip and right femur, no acute changes.   Right knee, total knee implant, intact.   IMPRESSION:  1.  Hematoma, right knee.  Rule out infection.  2.  Sciatic, rule out herniated nucleus pulposus, lumbar.   PLAN:  Admission.  Pain control.  MRI of the lumbar spine.  Venous Doppler,  rule out thrombus, aspiration, culture and sensitivity, aerobic, anaerobic,  and Gram's stain.  DC Coumadin.  Treatment of sciatica with IV medications.      Myrtie Neither, MD  Electronically Signed     AC/MEDQ  D:  09/22/2005  T:  09/22/2005  Job:  [id number]

## 2010-10-19 NOTE — H&P (Signed)
NAMEJMYA, ULIANO                  ACCOUNT NO.:  [id number]   MEDICAL RECORD NO.:  [id number]          PATIENT TYPE:  INP   LOCATION:  2550                         FACILITY:  [facility]   PHYSICIAN:  Myrtie Neither, MD      DATE OF BIRTH:  Feb 13, 1951   DATE OF ADMISSION:  11/07/2004  DATE OF DISCHARGE:                                HISTORY & PHYSICAL   CHIEF COMPLAINT:  Painful right knee.   HISTORY OF PRESENT ILLNESS:  This is a 60 year old black female who had been  treated for degenerative arthritis in the right knee.  Patient had post  traumatic arthritis resulting from a severe tibial plateau fracture she had  approximately six years ago which progressively worsened with breakdown on  the joint surface.   ALLERGIES:  1.  MORPHINE.  2.  CLINORIL.  3.  DILAUDID.   MEDICATIONS:  Decadron, Lodine, Tylenol, hydrocodone.   PAST MEDICAL HISTORY:  1.  Obesity.  2.  Reflux.  3.  Degenerative arthritis.  4.  Hysterectomy.   PAST SURGICAL HISTORY:  1.  Right proximal tibial ORIF of right tibial plateau and removal of      hardware from right tibial plateau.  2.  Arthroscopic right knee.   SOCIAL HISTORY:  Patient smokes less than a pack of cigarettes per day for  two years.  Patient states she quit 30 years ago.  Patient denies use of  alcohol or illegal drugs.   FAMILY HISTORY:  Noncontributory.   REVIEW OF SYMPTOMS:  Basically that of history of present illness.  Also,  degenerative disc disease and symptoms of reflux.  No cardiac, respiratory,  no urinary or bowel symptoms.   PHYSICAL EXAMINATION:  VITAL SIGNS:  Temperature 97.3, pulse 82,  respirations 20, blood pressure 154/93, height 63 inches, weight 319.  HEENT:  Head normocephalic.  Eyes:  Conjunctivae and sclerae are clear.  NECK:  Supple.  CHEST:  Clear.  CARDIOVASCULAR:  S1 and S2 regular.  EXTREMITIES:  Right knee genu valgum, crepitus medial and lateral  compartment with crepitus at both medial and lateral  compartment and  patellofemoral joint.  Range of motion is limited.  Varus deformity.  Old  surgical scars anterolaterally.   X-ray revealed degenerative joint disease with lots of medial and lateral  joint compartments.   IMPRESSION:  1.  Degenerative arthritis, right knee.  2.  Obesity.  3.  Reflux.   PLAN:  Right total knee arthroplasty.       AC/MEDQ  D:  11/07/2004  T:  11/07/2004  Job:  [id number]

## 2010-10-19 NOTE — Op Note (Signed)
NAMEJAMAIYA, [name]                  ACCOUNT NO.:  [id number]   MEDICAL RECORD NO.:  [id number]          PATIENT TYPE:  INP   LOCATION:  5004                         FACILITY:  [facility]   PHYSICIAN:  Myrtie Neither, MD      DATE OF BIRTH:  April 03, 1951   DATE OF PROCEDURE:  03/28/2005  DATE OF DISCHARGE:  04/02/2005                                 OPERATIVE REPORT   PREOPERATIVE DIAGNOSIS:  Painful right total knee.   POSTOPERATIVE DIAGNOSIS:  Infection, right total knee.   PROCEDURE:  Removal of right total knee implant and placement of bone cement  with vancomycin impregnation.   SURGEON:  Myrtie Neither, MD   ANESTHESIA:  General.   DESCRIPTION OF PROCEDURE:  The patient was taken to the operating room after  giving adequate preop medication and given general anesthesia and intubated.  The right lower extremity was prepped with DuraPrep and draped in a sterile  manner.  A tourniquet on low was used for hemostasis.  A midline incision  was made through the previous scar, going down through the skin and  subcutaneous tissue.  A medial paramedian incision was made through the  capsule.  The patella was reflected laterally.  In the suprapatellar pouch  area, there was found purulent material; this was cultured aerobically and  anaerobically with Gram's stain.  The patella was intact.  Femoral and  tibial components were also well-affixed.  With the use of osteotome, all 3  components were removed, as well as the methyl methacrylate about the  patella and tibial component.  Copious and abundant irrigation was done with  antibiotic solution using Sims pulse irrigator.  After adequate debridement  and irrigation of the area, a bone block cement spacer impregnated with  vancomycin was put in, as well as bone cement to cover the femur, creating  an articulation for the joint.  After the cement was set, wound closure was  then done with a 0 Vicryl for the fascia. 2-0 for the subcutaneous and  skin  staples for the skin.  A bulky compressive dressing was applied.  The  patient tolerated the procedure quite well and went to the recovery room in  stable and satisfactory condition.      Myrtie Neither, MD  Electronically Signed     AC/MEDQ  D:  05/18/2005  T:  05/21/2005  Job:  [id number]

## 2010-10-19 NOTE — Op Note (Signed)
[place]  Patient:    [name]                          MRN: [id number] Proc. Date: 06/13/99 Adm. Date:  [id number] Disc. Date: [id number] Attending:  Wende Mott                           Operative Report  PREOPERATIVE DIAGNOSIS:  Painful hardware, right knee.  POSTOPERATIVE DIAGNOSIS:  Painful hardware, right knee.  OPERATION:  Removal of hardware, right tibia.  SURGEON:  Kennieth Rad, M.D.  ANESTHESIA:  General.  DESCRIPTION OF PROCEDURE:  The patient was taken to the operating room after being given adequate preop medication.  The patient was given general anesthesia and intubated.  The right knee was prepped with DuraPrep and draped in sterile manner. Bovie was used for hemostasis.  A curvilinear incision made going through the same previous lateral scar, going through the skin and subcutaneous tissue.  Soft tissue was subperiosteally elevated from the plate and screws.  The screws and plate were identified and removed.  Copious irrigation with antibiotic solution was done.  Hemostasis was done with the use of Bovie.  There appeared to be some excessive  bleeding from the screw holes.  The wound was closed with 0 Vicryl for the fascia, 2-0 for the subcutaneous.  Revision of the scar was done, and a subcuticular suture was used.  Benzoin and Steri-Strips were used.  Compressive dressing was applied. The patient tolerated the procedure quite well and was taken to the recovery room in stable and satisfactory condition.  The patient was admitted to 23-hour observation due to the bleeding from the screw holes.  Ice packs and elevation.  The patient did quite well postoperatively, and was able to be discharged the next morning on Percocet 10, 1 q.4h. p.r.n. for pain, return to the office in one week. The patient was nonweightbearing on the right side.  The patient was discharged in stable and satisfactory  condition. DD:  07/17/99 TD:  07/17/99 Job: [id number]

## 2010-10-19 NOTE — Discharge Summary (Signed)
[name], [name]                  ACCOUNT NO.:  [id number]   MEDICAL RECORD NO.:  [id number]          PATIENT TYPE:  INP   LOCATION:  1607                         FACILITY:  [facility]   PHYSICIAN:  Myrtie Neither, MD      DATE OF BIRTH:  08-11-1950   DATE OF ADMISSION:  10/02/2006  DATE OF DISCHARGE:  10/06/2006                               DISCHARGE SUMMARY   ADMITTING DIAGNOSIS:  Painful loose right total knee.   DISCHARGE DIAGNOSES:  1. Painful loose right total knee.  2. Acute blood loss anemia.   COMPLICATIONS:  None.   INFECTIONS:  None.   OPERATION:  Right total knee revision.   PERTINENT HISTORY:  This is a 60 year old female with history of right  total knee revision secondary to MRSA infection.  The patient had done  quite well over the past year but suddenly developed severe pain in the  right knee.  No history of trauma.  The patient's x-rays revealed  loosening around the tibial component.  No signs of infection.   PERTINENT PHYSICAL:  That of the right knee.  Tender with antalgic  ambulation.  Pain on weightbearing.  Tender about the proximal tibial  area.  Range of motion was good.  No thigh or femoral pain.   HOSPITAL COURSE:  The patient with preoperative laboratory, CBC, EKG,  chest x-ray, PT, PTT, CMET, UA which were found to be stable for the  patient to undergo surgery.  The patient underwent right total knee  revision, Biomed implant, with revision of the tibial component.  The  patient tolerated the procedure quite well.  Postoperative course was  fairly benign.  Did receive 2 units of packed cells due to acute blood  loss anemia.  The patient progressed with physical therapy, partial  weightbearing on the right thigh.  Stable enough to be discharged home  to continue:  1. Percocet one to two q.4h. p.r.n. for pain.  2. Feosol 300 mg b.i.d.  3. Decadron 4 mg one daily x4 days.  4. Coumadin therapy.  The patient presently is on 10 mg daily.   The  patient being discharged in stable and satisfactory condition.  The  patient has home health PT and OT arranged.  Continue INR and Coumadin  on outpatient basis.  The patient is to return to the office in 1 week.  Partial weightbearing on the right side 50%.  The patient is being  discharged in stable and satisfactory condition.      Myrtie Neither, MD  Electronically Signed     AC/MEDQ  D:  10/06/2006  T:  10/06/2006  Job:  [id number]

## 2010-10-19 NOTE — Consult Note (Signed)
NAMEMAGUIRE, KILLMER                  ACCOUNT NO.:  [id number]   MEDICAL RECORD NO.:  [id number]          PATIENT TYPE:  EMS   LOCATION:  MAJO                         FACILITY:  [facility]   PHYSICIAN:  Myrtie Neither, MD      DATE OF BIRTH:  1950/12/20   DATE OF CONSULTATION:  DATE OF DISCHARGE:  06/05/2005                                   CONSULTATION   CHIEF COMPLAINT:  Nausea, vomiting, chills, and fever.   PERTINENT HISTORY:  This is a 60 year old black female, who had been treated  for a osteomyelitis and right total knee infection with IV Vancomycin.  The  patient had discontinuation of the IV antibiotics two weeks ago, and  yesterday had the pick line removed.  Patient developed fever, chills, night  sweats, and nausea and vomiting last night.  Persisted this morning this  morning.  The patient has been unable to hold anything on her stomach.   PAST MEDICAL HISTORY:  Right total knee replacement followed by right total  knee infection with the removal of implants and IV Vancomycin for MRSA  infection.   ALLERGIES:  CLINORIL AND MORPHINE.   MEDICATIONS:  Prednisone, Skelaxin, Oxycodone, Acetaminophen.   SOCIAL HISTORY:  No history of use of tobacco or alcohol.   FAMILY HISTORY:  Noncontributory.   REVIEW OF SYSTEMS:  Basically, can be found in history of present illness.  Otherwise, right knee pain from ambulation.  No cardiac or respiratory.  No  urinary or bowel symptoms.   PHYSICAL EXAMINATION:  VITAL SIGNS:  Temperature 98.1, pulse 127,  respirations 16, blood pressure 104/17, O2 saturation 92.  HEAD:  Normocephalic,.  EYES:  Conjunctivae clear.  NECK:  Supple.  EXTREMITY:  Right knee old surgical scar anteriorly, mild effusion, no  increased warmth or discoloration.  Limited range of motion.  Right arm old  pick line with compressive dressing on.  No redness or discoloration.  No  swelling, no bleeding from the area.  Mild tenderness from the pick line  site.  No  adenopathy.   X-ray of the right knee demonstrates old osteomyelitic changes.  No acute  changes.  The patient had CBC done, which was borderline 10.6, hemoglobin  13, hematocrit 41, granulocytes 8.5 mildly elevated, monocytes mildly  elevated 0.9. C-MET, blood sugar 131, elevated creatinine and BUN normal.  SGOT mildly elevated at 67, SGPT 68, alkaline phos 190.  C-reactive protein  is not back yet.  The patient also had blood cultures done x2.   IMPRESSION:  That of viral infection status post removal of pick line,  status post osteomyelitis right knee.   RECOMMENDATIONS:  Phenergan 12.5 mg q.6 p.r.n. for nausea, increase fluid  intake, use liquid diet, keep previous appointment for June 19, 2005.      Myrtie Neither, MD  Electronically Signed     AC/MEDQ  D:  06/05/2005  T:  06/05/2005  Job:  [id number]

## 2010-10-19 NOTE — Discharge Summary (Signed)
NAMETASHANTI, DALPORTO                  ACCOUNT NO.:  [id number]   MEDICAL RECORD NO.:  [id number]          PATIENT TYPE:  INP   LOCATION:  5004                         FACILITY:  [facility]   PHYSICIAN:  Myrtie Neither, MD      DATE OF BIRTH:  [birth year]   DATE OF ADMISSION:  11/17/2004  DATE OF DISCHARGE:  11/21/2004                                 DISCHARGE SUMMARY   ADMITTING DIAGNOSIS:  Painful right total knee with hematoma.  Rule out  infection.   DISCHARGE DIAGNOSIS:  Hemarthrosis, right knee.   INFECTIONS:  None.   OPERATIONS:  1.  Arthrotomy.  2.  Incision and drainage of hematoma, right knee.   PERTINENT HISTORY:  This is a 60 year old female, who just recently  underwent a right total knee arthroplasty, went home, and was discharged on  Coumadin.  The patient developed the severe onset of pain and swelling of  the right knee with the sudden onset of bleeding from the right knee.  The  patient was found to have a large hematoma and was admitted for I&D.   PERTINENT PHYSICAL:  RIGHT KNEE:  Minimal blood-stained drainage from the  right knee.  The staples are in place.  No increased warmth or redness.  A  small mid incision opening.  Range of motion to full extension and flexion  to 80 degrees.  Negative Homan's test.   HOSPITAL COURSE:  The patient underwent preop laboratory, CBC, CMET, UA,  PT/PTT, INR, and platelets.  The patient was found to be stable to undergo  surgery.  The patient underwent arthrotomy and evacuation of hematoma,  cultures, irrigation and debridement, with placement of drain in the wound.  The patient was found to have a Staphylococcal infection treated with  Vancomycin.  The patient had a PICC line put in place to continue the  Vancomycin for a 6 to 8 week outpatient therapy.  The pain was brought under  control.  The patient was able to be discharged and was discharged to  continue the Vancomycin through the PICC line.  Patient was discharged in a  stable and satisfactory condition.      Myrtie Neither, MD  Electronically Signed     AC/MEDQ  D:  01/25/2005  T:  01/27/2005  Job:  [id number]

## 2010-10-19 NOTE — Op Note (Signed)
NAMELEIGHTON, [name]                  ACCOUNT NO.:  [id number]   MEDICAL RECORD NO.:  [id number]          PATIENT TYPE:  REC   LOCATION:  OREH                         FACILITY:  [facility]   PHYSICIAN:  Myrtie Neither, MD      DATE OF BIRTH:  03-May-1951   DATE OF PROCEDURE:  11/20/2005  DATE OF DISCHARGE:                                 OPERATIVE REPORT   PREOPERATIVE DIAGNOSIS:  Hemarthrosis, right knee, status post  _COUMADIN_________  therapy.   POSTOPERATIVE DIAGNOSIS:  Hemarthrosis, right knee, status post _COUMADIN  _________  therapy.   ANESTHESIA:  General.   OPERATION/PROCEDURE:  Incision and drainage right knee with placement of  drain and culture and sensitivity.   DESCRIPTION OF PROCEDURE:  The patient was taken to the operating room after  given adequate preoperative medication, given general anesthesia and  intubated.  Right knee was prepped with DuraPrep and draped in a sterile  manner.  Tourniquet was used for hemostasis but not elevated.  The right  knee was draped in a sterile manner. The incision was made along the  proximal end of the old previous scar approximately one-third going down  through the skin and subcutaneous tissue, down to the capsule.  Clotted  blood was evacuated.  Culture and sensitivity were done.  Examination of the  soft tissue for any soft tissue bleeding was done with the tourniquet down.  Copious irrigation was done followed by placement of drain and wound closure  done with 0 Vicryl for the fascia, 2-0 for the subcutaneous.  Subcutaneous  soft tissue was also found separated from the muscle mass in the wound and  from the fascia.  This was reattached to the fascia with the use of 0  Vicryl.  After stabilizing the fascia, then the subcutaneous tissue was  closed followed by skin staples to the skin.  Hemovac drain placed into the  knee.  Compressive dressings applied.  The patient tolerated the procedure  quite well.  Went to the recovery room in  stable and satisfactory condition.      Myrtie Neither, MD  Electronically Signed     AC/MEDQ  D:  11/20/2005  T:  11/20/2005  Job:  [id number]

## 2010-10-19 NOTE — H&P (Signed)
[name], [name]                  ACCOUNT NO.:  [id number]   MEDICAL RECORD NO.:  [id number]          PATIENT TYPE:  INP   LOCATION:  5004                         FACILITY:  [facility]   PHYSICIAN:  Myrtie Neither, MD      DATE OF BIRTH:  09/29/1950   DATE OF ADMISSION:  11/17/2004  DATE OF DISCHARGE:                                HISTORY & PHYSICAL   CHIEF COMPLAINT:  Hematoma right knee, status post total knee replacement.   HISTORY OF PRESENT ILLNESS:  This is a 60 year old female who had undergone  right total knee arthroplasty for degenerative joint disease approximately  two weeks ago.  The patient had done quite well during hospital stay.  While  at home, complained of sudden onset of bleeding from the right knee.  The  patient was seen at the office and found to have hematoma, no sign of  infection, and had reinforcement of the dressing and discontinuation of her  Coumadin.  The patient was contacted again two days later with persistent  bleeding from the area.  The patient was then instructed to come to the  hospital for I&D of her right knee.  The patient denies any fever, chills,  or night sweats, and had been progressing with her therapy quite well.   PAST MEDICAL HISTORY:  1.  Right knee surgery.  2.  Arthroscopy.  3.  ORIF right tibial plateau.  4.  Hysterectomy.  5.  No history of high blood pressure or diabetes.   ALLERGIES:  1.  MORPHINE.  2.  CLINORIL.  3.  DILAUDID.   MEDICATIONS:  Coumadin; Percocet; Tylenol.   REVIEW OF SYSTEMS:  Please refer to history of present illness.  No chronic  respiratory, urinary, or bowel symptoms.   SOCIAL HISTORY:  No history of use of alcohol.  The patient quit use of  tobacco 30 years ago.   PHYSICAL EXAMINATION:  VITAL SIGNS:  Temperature 97.3; pulse 82;  respirations 20; blood pressure 154/93; height 63 inches; weight 318.  HEENT:  Normocephalic.  Anicteric sclerae.  CHEST:  Clear.  CARDIAC:  S1, S2.  EXTREMITIES:  Right knee with minimal blood-stained dressing to the right  knee.  Staples in place.  No increased warmth or redness.  Small mid-  incision open.  Range of motion:  Full extension, flexion to 80 degrees.  Negative Homans' test.   IMPRESSION:  Hematoma right knee.  Rule out infection.   PLAN:  I&D right knee.       AC/MEDQ  D:  11/21/2004  T:  11/21/2004  Job:  [id number]

## 2010-10-19 NOTE — Discharge Summary (Signed)
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   DISCHARGE DIAGNOSES:  1.  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  2.  Postoperative anemia from blood loss.  3.  Shock without trauma.  4.  Blood loss.   COMPLICATIONS:  None.   INFECTIONS:  None.   OPERATION:  Removal of cement block, right knee and right total knee  revision.   PERTINENT HISTORY:  This is a 60 year old female who had undergone right  total knee replacement, which she developed a MRSA knee infection.  She had  removal of the implant and placement of antibiotic cement.  The patient has  gone approximately 8 months with no sign of infection, with repeat sed rate  and repeat C-reactive protein testing.  The patient has been ambulatory with  the use of a walker.  The patient returned for a total knee revision.   PERTINENT PHYSICAL:  Pertinent physical was that of the right knee various  deformity, ankylotic.  Range of motion was limited.  Nontender.  No  effusion.  Negative Homans' test.   HOSPITAL COURSE:  The patient underwent preop laboratories, CBC, EKG, chest  x-ray, CMET, UA, PT and PTT.  The patient was found to be stable enough to  undergo surgery and underwent a right total knee revision.  Postop course,  the patient did have large blood loss due to a tourniquet that was not  functional during the procedure and patient was in a mild shock with drop of  blood pressure.  The patient was transfused and became stable.  Physical  therapy progress went well.  Nonweightbearing on the right  side.  Pain was  brought under control.  The patient's blood count was stabilized.  The  patient was started on Coumadin and on discharge will be continued on  Coumadin, Percocet 10/650, home health and PT was partial weightbearing on  the right side.  The patient was discharged and to return to the office in 1  week.  The patient was discharged in stable and satisfactory condition.      Myrtie Neither, MD  Electronically Signed     AC/MEDQ  D:  10/23/2005  T:  10/23/2005  Job:  [id number]

## 2010-10-19 NOTE — Discharge Summary (Signed)
NAMECLAIR, [name]                  ACCOUNT NO.:  [id number]   MEDICAL RECORD NO.:  [id number]          PATIENT TYPE:  INP   LOCATION:  5040                         FACILITY:  [facility]   PHYSICIAN:  Myrtie Neither, MD      DATE OF BIRTH:  [birth year]   DATE OF ADMISSION:  11/07/2004  DATE OF DISCHARGE:  11/13/2004                                 DISCHARGE SUMMARY   ADMITTING DIAGNOSES:  1.  Severe degenerative joint disease, right knee.  2.  Obesity.  3.  Reflux.   DISCHARGE DIAGNOSES:  1.  Severe degenerative joint disease, right knee.  2.  Obesity.  3.  Reflux.   COMPLICATIONS:  None.   INFECTIONS:  None.   OPERATION:  Right total knee arthroplasty.   PERTINENT HISTORY:  This is a 60 year old female who has been followed over  the years for traumatic arthritis involving the right knee.  The patient's  condition progressively worsened with collapse of the joint spaces and  giving away, and persistent pain, both at rest as well as on ambulation.   PERTINENT PHYSICAL EXAMINATION:  Right knee genu valgum crepitus medial and  lateral compartment with crepitus of the patellofemoral joint.  Range of  motion is limited.  Varus deformities and old surgical scars  anterolaterally.  X-rays revealed medial and lateral cartilage joint space  loss with sclerosis.  The patient had a preop medical evaluation and found  to be stable to undergo surgery.   LABORATORY DATA:  The patient had preop laboratory, CBC, C-MET, EKG, chest x-  ray, PT, PTT and UA, which was found to be stable enough for the patient to  undergo surgery.   HOSPITAL COURSE:  The patient underwent right total knee arthroplasty.  She  tolerated the procedure quite well.  Postop course:  The patient was placed  on preop IV antibiotics and postop IV antibiotics, prophylactic Coumadin,  rehab, physical therapy, OT therapy, CPM.  The patient progressed fairly  well with ambulation, partial weightbearing on the right side, and  pain was  brought under control.  The patient was stable enough to undergo discharge  and was discharged with home health and physical therapy arranged.   DISCHARGE MEDICATIONS:  Percocet 5 mg q.4h. p.r.n. pain.   DISCHARGE INSTRUCTIONS:  Ice pack, elevation, partial weightbearing on right  side.  The patient was discharged in stable and satisfactory condition.      Myrtie Neither, MD  Electronically Signed     AC/MEDQ  D:  01/25/2005  T:  01/27/2005  Job:  [id number]

## 2010-10-19 NOTE — H&P (Signed)
[name], [name]                  ACCOUNT NO.:  [id number]   MEDICAL RECORD NO.:  [id number]          PATIENT TYPE:  INP   LOCATION:  1607                         FACILITY:  [facility]   PHYSICIAN:  Myrtie Neither, MD      DATE OF BIRTH:  May 18, 1951   DATE OF ADMISSION:  10/02/2006  DATE OF DISCHARGE:  10/06/2006                              HISTORY & PHYSICAL   CHIEF COMPLAINT:  Painful right total knee.   HISTORY OF PRESENT ILLNESS:  This is a 60 year old who has been followed  in the office for right total knee revision.  The patient had history of  MRSA infection and had revision of her right knee approximately a year  ago.  The patient more recently developed the sudden onset of pain in  the left knee, proximal tibial area, on weightbearing and moving about  in bed.  No particular history of trauma.  The patient had been  ambulating quite well and going to exercise facilities without problems.  The patient developed the sudden onset of this pain which she had not  had before.  The patient was seen in the office and x-rays revealed  loosening about the tibial component between the bone cement and mantle.   PAST MEDICAL HISTORY:  1. Multiple operations to the right knee initiating from tibial      plateau fracture which she received years ago with ORIF followed by      removal of hardware, arthroscopy of the right knee, eventually      total knee replacement, surgery for MRSA infection of the total      knee and removal of implant, surgery for hematoma to the right knee      and last procedure in 2007 of total knee revision.  2. The patient also has a history of anemia, obesity and reflux.   REVIEW OF SYSTEMS:  As stated in history of present illness, no cardiac  or respiratory, no urinary or bowel symptoms.   FAMILY HISTORY:  Noncontributory.   ALLERGIES:  MORPHINE, DILAUDID and CLINORIL.   SOCIAL HISTORY:  Negative.  No history of use of alcohol or tobacco.   MEDICATIONS:   Lodine, hydrocodone, aspirin, multivitamin, metoprolol,  Lasix.   PHYSICAL EXAMINATION:  GENERAL:  Obese, alert and oriented, in no acute  distress.  VITAL SIGNS:  Temperature is 97.1, pulse 70, respirations 16, blood  pressure 125/81.  Height 64 inches.  Weight 140 kg.  HEENT:  Head normocephalic.  Eyes and conjunctivae clear.  NECK:  Supple.  CHEST:  Clear.  CARDIAC:  S1 and S2 regular.  EXTREMITIES:  Right knee:  Tender proximal tibial area.  Range of motion  is good, but limited.  No effusion.  No increased warmth.  Negative  Homans test.   RADIOLOGIC FINDINGS:  X-ray reveals radiolucency about the tibial  component.   IMPRESSION:  Loosening of right total knee; painful right total knee.   PLAN:  Right total knee revision.      Myrtie Neither, MD  Electronically Signed     AC/MEDQ  D:  10/21/2006  T:  10/22/2006  Job:  [id number]

## 2010-10-19 NOTE — Discharge Summary (Signed)
[name], [name]                  ACCOUNT NO.:  [id number]   MEDICAL RECORD NO.:  [id number]          PATIENT TYPE:  INP   LOCATION:  1513                         FACILITY:  [facility]   PHYSICIAN:  Myrtie Neither, MD      DATE OF BIRTH:  June 13, 1950   DATE OF ADMISSION:  10/04/2005  DATE OF DISCHARGE:  10/07/2005                                 DISCHARGE SUMMARY   ADMISSION DIAGNOSES:  1.  Hemarthrosis, left knee.  2.  Status post total knee arthroplasty, hemarthrosis secondary to Coumadin      level.   DISCHARGE DIAGNOSES:  1.  Hemarthrosis, left knee.  2.  Status post total knee arthroplasty, hemarthrosis secondary to Coumadin      level.   COMPLICATIONS:  None.   INFECTIONS:  None.   OPERATION:  Incision and drainage of hematoma, left knee; replacement of  drain and culture and sensitivity of the area done on Oct 05, 2005.   PERTINENT HISTORY:  The patient is a 60 year old female who has undergone  left total knee revision and did quite well in the hospital stay  postoperatively, was discharged on Coumadin.  The patient's INR was  controlled on outpatient through home health service and had INR of 3 and  developed diffuse swelling of the left knee.  The patient was seen in the  office and admitted for incision and drainage of the left knee and  discontinued on Coumadin.   PERTINENT PHYSICAL:  The left knee was swollen.  Old surgical scar was  healed well.  Range of motion limited to effusion in the left knee.  The  patient was ambulatory with the use of walker.   HOSPITAL COURSE:  The patient was discontinued on Coumadin and was given  vitamin K to reverse the Coumadin.  The patient was then taken to the  operating room.  Incision and drainage was performed.  Cultures of the area  were done.  The patient was given vancomycin prophylactically.  Postop  course was fairly benign.  Cultures were negative.  Drain was removed from  the knee.  Patient was started back on ambulation.   The patient was able to  be discharged to continue her previous hospitalization medication, Percocet  for pain and discontinued off of Coumadin.  To return to the office in one  week.  The patient was discharged in improved and stable condition.      Myrtie Neither, MD  Electronically Signed     AC/MEDQ  D:  12/18/2005  T:  12/18/2005  Job:  [id number]

## 2010-10-19 NOTE — Op Note (Signed)
NAMETRULEE, HAMSTRA                  ACCOUNT NO.:  [id number]   MEDICAL RECORD NO.:  [id number]          PATIENT TYPE:  INP   LOCATION:  5021                         FACILITY:  [facility]   PHYSICIAN:  Myrtie Neither, MD      DATE OF BIRTH:  1951/02/07   DATE OF PROCEDURE:  09/25/2005  DATE OF DISCHARGE:                                 OPERATIVE REPORT   PREOPERATIVE DIAGNOSIS:  Hemarthrosis, right knee, status post Coumadin  therapy.   POSTOPERATIVE DIAGNOSIS:  Hemarthrosis, right knee, status post Coumadin  therapy.   OPERATION PERFORMED:  Incision and drainage, right knee.  Placement of  Hemovac drain and wound closure.   SURGEON:  Myrtie Neither, MD   ANESTHESIA:  General.   DESCRIPTION OF PROCEDURE:  The patient was taken to the operating room after  given adequate preop medications, given general anesthesia and intubated.  Right lower extremity was prepped with DuraPrep and draped in a sterile  manner.  Bovie used for hemostasis.  Incision was made over the right knee  going through the old previous scar.  Copious irrigation with saline and  antibiotic solution was done.  Tremendous hematoma was removed from the  joint after which inspection of his knee revealed a bleeder in the superior  lateral aspect next to the quadriceps.  This was tied and coagulated.  The  joint itself was dry after doing this.  Further inspection did not reveal  any other bleeding site.  Further irrigation was done. Wound closure was  then done with 0 Vicryl for the subcutaneous, skin staples for the skin.  Bulky compressive dressing was applied.  Hemovac drain was placed into the  wound.  Patient went to her room in satisfactory stable condition.  Patient  was started on packed red blood cells in the operating room.  Blood loss was  approximately 250 mL.      Myrtie Neither, MD  Electronically Signed     AC/MEDQ  D:  09/25/2005  T:  09/26/2005  Job:  [id number]

## 2010-11-02 ENCOUNTER — Ambulatory Visit
Admission: RE | Admit: 2010-11-02 | Discharge: 2010-11-02 | Disposition: A | Discharge status: Home / Self Care | Payer: Medicare HMO | Source: Ambulatory Visit | Attending: Orthopedic Surgery | Admitting: Orthopedic Surgery

## 2010-11-02 ENCOUNTER — Other Ambulatory Visit: Payer: Self-pay | Admitting: Family Medicine

## 2010-11-02 ENCOUNTER — Other Ambulatory Visit: Payer: Medicare HMO

## 2010-11-02 ENCOUNTER — Other Ambulatory Visit: Payer: Self-pay | Admitting: Orthopedic Surgery

## 2010-11-02 DIAGNOSIS — E119 Type 2 diabetes mellitus without complications: Secondary | ICD-10-CM

## 2010-11-02 DIAGNOSIS — R52 Pain, unspecified: Secondary | ICD-10-CM

## 2010-11-26 ENCOUNTER — Ambulatory Visit (INDEPENDENT_AMBULATORY_CARE_PROVIDER_SITE_OTHER): Payer: Medicare HMO | Admitting: Family Medicine

## 2010-11-26 ENCOUNTER — Encounter: Payer: Self-pay | Admitting: Family Medicine

## 2010-11-26 VITALS — BP 112/71 | HR 88 | Ht 63.0 in | Wt 277.5 lb

## 2010-11-26 DIAGNOSIS — Z96659 Presence of unspecified artificial knee joint: Secondary | ICD-10-CM

## 2010-11-26 DIAGNOSIS — E785 Hyperlipidemia, unspecified: Secondary | ICD-10-CM

## 2010-11-26 DIAGNOSIS — Z8739 Personal history of other diseases of the musculoskeletal system and connective tissue: Secondary | ICD-10-CM

## 2010-11-26 DIAGNOSIS — E119 Type 2 diabetes mellitus without complications: Secondary | ICD-10-CM

## 2010-11-26 NOTE — Patient Instructions (Addendum)
You are cleared for surgery from our standpoint.  Continue to do your pool exercises.  Your A1c is 6.4 today.  We will order a bone scan to see if you need to go back on Fosamax.  You will need to repeat your cholesterol test in about 4 months.  (late October).

## 2010-11-26 NOTE — Assessment & Plan Note (Signed)
Plan to get repeat dexa scan to determine if the patient still needs to be on Fosamax or she can stay off of it.

## 2010-11-26 NOTE — Assessment & Plan Note (Addendum)
Pt has diet and exercise controlled diabetes with an A1c of 6.4 She is doing well. She exercises most daily for 1 hour in the pool for the last 2 weeks. Prior to that she was walking 10,000 steps a day but since her knee has been hurting she has switched to the pool exercises.  She does not get SOB when she exercises beyond what is normal.  Ref to opthalmology for diabetic eye exam.

## 2010-11-26 NOTE — Assessment & Plan Note (Signed)
Pt had a knee replacement that has to be redone. She is having that surgery on Aug 2nd. She is cleared for surgery as she can do 4 METS and has no other risk factors other than age and h/o diabetes. She is well controlled with her DM and HLD.

## 2010-11-26 NOTE — Progress Notes (Signed)
Diabetes:  Pt is exercising 1 hour a day most days of the week. Her A1c was 6.2 and is now 6.4. She is controlling it with diet and exercise. She has not been able to exercise as vigerously the last 2 weeks because of the knee pain. She is having a repeat knee replacement on Aug 2nd.   HLD:  Pt is doing well and taking her lovastatin and fish oil as prescribed. She has no fam h/o MI, no h/o of MI herself and no h/o cardiac problems. She takes an aspirin daily and exercises.   Blood pressure: Pt has normal blood pressure and is not taking anything to control it.  Bone Density: Pt has had some osteopenia in the past and was on Fosamax but has somehow gotten off the medication and now she needs to be retested to see if she needs to be on it.   ROS: neg as noted above. Rt knee pain.   PE:  Gen: seated comfrotably in chair, uses cane to walk. CV: RRR, no murmur Pulm: CTAB, no wheezing or crackles Abd: obese

## 2010-11-26 NOTE — Assessment & Plan Note (Signed)
Pt has fairly well controlled cholesterol and is taking her lovastatin as prescribed.  Will need to recheck it in about 4 months.

## 2010-11-28 ENCOUNTER — Telehealth: Payer: Self-pay | Admitting: *Deleted

## 2010-11-28 NOTE — Telephone Encounter (Signed)
LM for patient to call back to find out if she has an eye doctor already or if I can make an appointment with anyone.

## 2010-11-28 NOTE — Telephone Encounter (Signed)
Pt returning call, says to schedule her with anyone.

## 2010-12-14 ENCOUNTER — Encounter (HOSPITAL_COMMUNITY)
Admission: RE | Admit: 2010-12-14 | Discharge: 2010-12-14 | Disposition: A | Discharge status: Home / Self Care | Payer: Medicare HMO | Source: Ambulatory Visit | Attending: Orthopedic Surgery | Admitting: Orthopedic Surgery

## 2010-12-14 ENCOUNTER — Other Ambulatory Visit (HOSPITAL_COMMUNITY): Payer: Self-pay | Admitting: Orthopedic Surgery

## 2010-12-14 ENCOUNTER — Other Ambulatory Visit: Payer: Self-pay | Admitting: Orthopedic Surgery

## 2010-12-14 DIAGNOSIS — Z01811 Encounter for preprocedural respiratory examination: Secondary | ICD-10-CM

## 2010-12-14 LAB — APTT: aPTT: 28 seconds (ref 24–37)

## 2010-12-14 LAB — COMPREHENSIVE METABOLIC PANEL
ALT: 16 U/L (ref 0–35)
AST: 18 U/L (ref 0–37)
Alkaline Phosphatase: 113 U/L (ref 39–117)
CO2: 30 mEq/L (ref 19–32)
Chloride: 102 mEq/L (ref 96–112)
Creatinine, Ser: 0.99 mg/dL (ref 0.50–1.10)
GFR calc non Af Amer: 57 mL/min — ABNORMAL LOW (ref >60)
Potassium: 4.2 mEq/L (ref 3.5–5.1)
Total Bilirubin: 0.2 mg/dL — ABNORMAL LOW (ref 0.3–1.2)

## 2010-12-14 LAB — URINALYSIS, ROUTINE W REFLEX MICROSCOPIC
Glucose, UA: NEGATIVE mg/dL
Hgb urine dipstick: NEGATIVE
Specific Gravity, Urine: 1.014 (ref 1.005–1.030)

## 2010-12-14 LAB — CBC
HCT: 41 % (ref 36.0–46.0)
Hemoglobin: 13.2 g/dL (ref 12.0–15.0)
MCV: 84.5 fL (ref 78.0–100.0)
RDW: 13.3 % (ref 11.5–15.5)
WBC: 6.8 10*3/uL (ref 4.0–10.5)

## 2010-12-14 LAB — PROTIME-INR
INR: 0.92 (ref 0.00–1.49)
Prothrombin Time: 12.6 seconds (ref 11.6–15.2)

## 2010-12-14 LAB — SURGICAL PCR SCREEN: Staphylococcus aureus: NEGATIVE

## 2010-12-20 ENCOUNTER — Inpatient Hospital Stay (HOSPITAL_COMMUNITY)
Admission: RE | Admit: 2010-12-20 | Discharge: 2010-12-25 | DRG: 467 | Disposition: A | Discharge status: Home Health | Payer: Medicare HMO | Source: Ambulatory Visit | Attending: Orthopedic Surgery | Admitting: Orthopedic Surgery

## 2010-12-20 DIAGNOSIS — G8929 Other chronic pain: Secondary | ICD-10-CM | POA: Diagnosis present

## 2010-12-20 DIAGNOSIS — Z01812 Encounter for preprocedural laboratory examination: Secondary | ICD-10-CM

## 2010-12-20 DIAGNOSIS — K219 Gastro-esophageal reflux disease without esophagitis: Secondary | ICD-10-CM | POA: Diagnosis present

## 2010-12-20 DIAGNOSIS — Z96659 Presence of unspecified artificial knee joint: Secondary | ICD-10-CM

## 2010-12-20 DIAGNOSIS — D62 Acute posthemorrhagic anemia: Secondary | ICD-10-CM | POA: Diagnosis not present

## 2010-12-20 DIAGNOSIS — M549 Dorsalgia, unspecified: Secondary | ICD-10-CM | POA: Diagnosis present

## 2010-12-20 DIAGNOSIS — E119 Type 2 diabetes mellitus without complications: Secondary | ICD-10-CM | POA: Diagnosis present

## 2010-12-20 DIAGNOSIS — T84039A Mechanical loosening of unspecified internal prosthetic joint, initial encounter: Principal | ICD-10-CM | POA: Diagnosis present

## 2010-12-20 DIAGNOSIS — I10 Essential (primary) hypertension: Secondary | ICD-10-CM | POA: Diagnosis present

## 2010-12-20 LAB — GLUCOSE, CAPILLARY
Glucose-Capillary: 211 mg/dL — ABNORMAL HIGH (ref 70–99)
Glucose-Capillary: 233 mg/dL — ABNORMAL HIGH (ref 70–99)

## 2010-12-20 LAB — GRAM STAIN

## 2010-12-21 LAB — CBC
Hemoglobin: 8.8 g/dL — ABNORMAL LOW (ref 12.0–15.0)
Platelets: 148 10*3/uL — ABNORMAL LOW (ref 150–400)
RBC: 3.18 MIL/uL — ABNORMAL LOW (ref 3.87–5.11)
WBC: 15.6 10*3/uL — ABNORMAL HIGH (ref 4.0–10.5)

## 2010-12-21 LAB — GLUCOSE, CAPILLARY
Glucose-Capillary: 180 mg/dL — ABNORMAL HIGH (ref 70–99)
Glucose-Capillary: 146 mg/dL — ABNORMAL HIGH (ref 70–99)

## 2010-12-21 LAB — PROTIME-INR
INR: 1.35 (ref 0.00–1.49)
Prothrombin Time: 16.9 seconds — ABNORMAL HIGH (ref 11.6–15.2)

## 2010-12-22 LAB — HEMOGLOBIN AND HEMATOCRIT, BLOOD: HCT: 22.7 % — ABNORMAL LOW (ref 36.0–46.0)

## 2010-12-22 LAB — WOUND CULTURE

## 2010-12-22 LAB — PROTIME-INR
INR: 1.6 — ABNORMAL HIGH (ref 0.00–1.49)
Prothrombin Time: 19.3 seconds — ABNORMAL HIGH (ref 11.6–15.2)

## 2010-12-22 LAB — CBC
HCT: 19.6 % — ABNORMAL LOW (ref 36.0–46.0)
Hemoglobin: 6.7 g/dL — CL (ref 12.0–15.0)
MCHC: 34.2 g/dL (ref 30.0–36.0)
RDW: 13.6 % (ref 11.5–15.5)
WBC: 15.5 10*3/uL — ABNORMAL HIGH (ref 4.0–10.5)

## 2010-12-22 LAB — GLUCOSE, CAPILLARY: Glucose-Capillary: 165 mg/dL — ABNORMAL HIGH (ref 70–99)

## 2010-12-23 LAB — CROSSMATCH
ABO/RH(D): B POS
Antibody Screen: NEGATIVE
Unit division: 0
Unit division: 0
Unit division: 0

## 2010-12-23 LAB — GLUCOSE, CAPILLARY

## 2010-12-24 ENCOUNTER — Inpatient Hospital Stay (HOSPITAL_COMMUNITY): Payer: Medicare HMO

## 2010-12-24 LAB — PROTIME-INR
INR: 1.83 — ABNORMAL HIGH (ref 0.00–1.49)
Prothrombin Time: 21.5 seconds — ABNORMAL HIGH (ref 11.6–15.2)

## 2010-12-24 LAB — GLUCOSE, CAPILLARY
Glucose-Capillary: 130 mg/dL — ABNORMAL HIGH (ref 70–99)
Glucose-Capillary: 113 mg/dL — ABNORMAL HIGH (ref 70–99)

## 2010-12-25 LAB — CROSSMATCH
ABO/RH(D): B POS
Antibody Screen: NEGATIVE
Unit division: 0

## 2010-12-25 LAB — ANAEROBIC CULTURE: Gram Stain: NONE SEEN

## 2010-12-25 LAB — POCT I-STAT 4, (NA,K, GLUC, HGB,HCT)
Glucose, Bld: 163 mg/dL — ABNORMAL HIGH (ref 70–99)
Glucose, Bld: 184 mg/dL — ABNORMAL HIGH (ref 70–99)
HCT: 31 % — ABNORMAL LOW (ref 36.0–46.0)
Hemoglobin: 10.5 g/dL — ABNORMAL LOW (ref 12.0–15.0)
Potassium: 4.7 mEq/L (ref 3.5–5.1)
Sodium: 137 mEq/L (ref 135–145)

## 2010-12-25 LAB — PROTIME-INR
INR: 1.96 — ABNORMAL HIGH (ref 0.00–1.49)
Prothrombin Time: 22.7 seconds — ABNORMAL HIGH (ref 11.6–15.2)

## 2010-12-25 LAB — GLUCOSE, CAPILLARY
Glucose-Capillary: 135 mg/dL — ABNORMAL HIGH (ref 70–99)
Glucose-Capillary: 122 mg/dL — ABNORMAL HIGH (ref 70–99)
Glucose-Capillary: 90 mg/dL (ref 70–99)

## 2010-12-25 LAB — HEMOGLOBIN AND HEMATOCRIT, BLOOD: Hemoglobin: 9.1 g/dL — ABNORMAL LOW (ref 12.0–15.0)

## 2010-12-27 ENCOUNTER — Encounter: Payer: Self-pay | Admitting: Family Medicine

## 2010-12-27 ENCOUNTER — Other Ambulatory Visit: Payer: Self-pay | Admitting: *Deleted

## 2010-12-27 MED ORDER — GLUCOSE BLOOD VI STRP: ORAL_STRIP | Status: DC

## 2010-12-27 NOTE — Telephone Encounter (Signed)
Error

## 2010-12-27 NOTE — Telephone Encounter (Signed)
This encounter was created in error - please disregard.

## 2011-01-02 NOTE — Discharge Summary (Signed)
  NAMEVALERI, [name]                  ACCOUNT NO.:  [id number]  MEDICAL RECORD NO.:  [id number]  LOCATION:  5016                         FACILITY:  [facility]  PHYSICIAN:  Myrtie Neither, MD      DATE OF BIRTH:  07-18-1950  DATE OF ADMISSION:  12/20/2010 DATE OF DISCHARGE:  12/25/2010                              DISCHARGE SUMMARY   ADMITTING DIAGNOSIS:  Painful loose right total knee.  DISCHARGE DIAGNOSIS:  Painful loose right total knee.  COMPLICATIONS:  None.  INFECTIONS:  None.  OPERATION:  Right total knee revision, Biomet implant, done on December 20, 2010.  PERTINENT HISTORY:  This is a 60 year old female with following the office for painful loose right total knee, which progressively worsened with osteolysis, involving the tibial component.  Pertinent physical was that of the right knee.  The patient ambulates with antalgic gait, limp on the right side.  Limited range of motion on the right knee.  Tender medial and lateral compartments.  Negative Homans' test.  X-rays as well as CT demonstrating loosening of the femoral and tibial components.  No evidence of osteomyelitis.  HOSPITAL COURSE:  The patient underwent preop labs, CBC, EKG, chest x- ray, PT/PTT, platelet count, CMET, UA.  The patient was found to be stable enough to undergo surgery and underwent right total knee revision.  POSTOPERATIVE COURSE:  The patient was placed on both pre and postoperative IV antibiotics, CPM, physical therapy, toe-touch weightbearing on the right side.  Hemovac drain was removed in the 24 hours.  The patient H and H did drop, requiring two more units of packed cells.  The patient received 4 units of packed cells during the surgery. Presently, the patient's pain is under control, tolerating physical therapy and CPM quite well, toe-touch on the right side with use of walker.  The patient is afebrile.  Recent H and H demonstrating 9.1 and 27.  INR 1.96.  The patient's CPM is up to 55 degrees  of flexion, full extension.  Medication, the patient will be placed on Feosol 325 mg t.i.d., Coumadin 6 mg daily, weekly INRs.  Home Health and Physical Therapy had been arranged.  Robaxin 750 mg q.6 p.r.n., Percocet 5 mg two q.3-4 hour p.r.n.  She is to discontinue her anti-inflammatory medication due to use of Coumadin.  The patient is able to continue her omega-3, multivitamin, lovastatin 20 mg daily, Lasix 20 mg daily, calcium carbonate with vitamin D daily.  The patient is being discharged in stable and satisfactory condition.  She will return to the office in 1 week.     Myrtie Neither, MD     AC/MEDQ  D:  12/25/2010  T:  12/25/2010  Job:  [id number]  Electronically Signed by Myrtie Neither MD on 01/02/2011 12:25:48 PM

## 2011-01-02 NOTE — Op Note (Signed)
NAMELAIKYNN, POLLIO                  ACCOUNT NO.:  [id number]  MEDICAL RECORD NO.:  [id number]  LOCATION:  5016                         FACILITY:  [facility]  PHYSICIAN:  Myrtie Neither, MD      DATE OF BIRTH:  July 12, 1950  DATE OF PROCEDURE: DATE OF DISCHARGE:                              OPERATIVE REPORT   PREOPERATIVE DIAGNOSIS:  Painful right total knee, loose right total knee.  ANESTHESIA:  General.  PROCEDURE:  Right total knee revision femoral and tibial components, Biomet implant.  The patient taken to operating room after, given adequate preop medications, given general anesthesia and intubated, right lower extremity was prepped with DuraPrep and draped in sterile manner. Tourniquet and Bovie was used for hemostasis.  Anterior incision made on going through the old previous scar going through skin and subcutaneous tissue down to the capsule.  A medial paramedian incision made into the capsule, evacuation of clear serous fluid was noted.  Incision was made from the tibial tuberosity up to the quadriceps.  The knee was taken to the semi-flexed position with the patella subluxed laterally.  A soft tissue resection with overgrowth of synovium about the patella and medial and lateral compartment was done followed by removal of the key hole in the tibial bearing surface in place.  This allowed the knee to be taken up into more flexed position and with the use of dental saw cutting about the edge of the femoral component and we used osteotomes and a femoral distractor, the femoral component was easily removed. Methyl methacrylate down the femoral canal was then removed, the femur itself bony structure in very good condition.  Attention was then turned to the tibial component.  Again with use of dental saw, brought the edges of the tibial component.  Tremendous effort was needed to get the cement loose from the proximal end of the bearing surface of the tibia. There were 2 modular  bearing attached to the tibial component.  These were removed further first, which allowed better anchoring underneath the tibial plate in attempt to extract from the tibia.  With multiple efforts, this was not possible.  With the use of flexible osteotome about the anterior medial and lateral and posterior border of the tibial components, removing the cement.  After tremendous amount of effort, the tibial component was finally able to be removed from the proximal tibia. Methyl methacrylate was then removed with use of osteotome both flexible and rigid.  After removing the methyl methacrylate, bone drill was used to penetrate the bone plug and after multiple efforts, the bone plus itself was able to be removed.  Tibial bone surface and distally and the shaft was intact.  There was a nondisplaced crack of the tibial cortex anteriorly.  This was first stabilized with mini fragment 5 hole plate. This was found to be good and stable.  Then freshened up the tibial surface was done first with the tibial cutting jig in place intramedullary.  This guide was then removed.  Irrigation was done. Reaming down the femoral canal was also done and reaming down the femoral canal with up to 18 mm down the tibial canal was set.  Reaming was done up to 20 mm.  Trial for both tibia and femoral components were put in place.  Tibial component was found to fit nice and snug.  Sizing of the femoral component was found to be 65 mm that of the tibial surface.  This itself was a 14 mm x 120 mm stem, maximum smoothed extension and the femoral component was a 16-mm x 120-mm stem, tibial bearing surface was 18 mm x 71.  Block was used on the tibial surface. There was a 6-mm augmenting block laterally and 10-mm augmenting block medially.  Cement plugs were both placed in the femur as well as the tibia.  A size 4 was used for the femur and size 3 for the tibia.  With the implants in place with full extension and flexion  led to a 90-degree position with good medial and lateral stability.  An 18-mm tibial bearing was tried but was felt to allow too much medial and lateral play.  Lateral release of the patella was done due to some lateral subluxing of the patella after release the patella basically the patella was able to glide appropriately without subluxing.  Methyl methacrylate was mixed.  Both femur femoral and tibial components were cemented. Excess methyl methacrylate was removed.  Again range of motion was tested before locking and the final tibial bearing surface of full extension and flexion to 90 degrees position, good medial lateral stability and good tracking of the patella.  Copious irrigation was done followed by placement of 18-mm tibial bearing surface which was locked in with a key.  Tourniquet was let down after 2 hours.  Hemostasis was obtained.  The patient received 4 units packed cells during the procedure.  Surgical time was 7-1/2 hours.  The patient went to recovery room in stable and satisfactory condition without complications.     Myrtie Neither, MD     AC/MEDQ  D:  12/24/2010  T:  12/25/2010  Job:  [id number]  Electronically Signed by Myrtie Neither MD on 01/02/2011 12:25:58 PM

## 2011-01-02 NOTE — Op Note (Signed)
  [name], [name]                  ACCOUNT NO.:  [id number]  MEDICAL RECORD NO.:  [id number]  LOCATION:  5016                         FACILITY:  [facility]  PHYSICIAN:  Myrtie Neither, MD      DATE OF BIRTH:  [birth year]  DATE OF PROCEDURE:  12/20/2010 DATE OF DISCHARGE:                              OPERATIVE REPORT   ADDENDUM  Wound closure to the right knee was that of 0 Vicryl for the fascia, 2-0 silk for the subcutaneous, skin staples for the skin.  Hemovac drain was placed into the wound.  Bulky compressive dressing was applied.  Knee immobilizer applied.  The patient tolerated the procedure quite well, went to recovery room in stable and satisfactory condition.     Myrtie Neither, MD     AC/MEDQ  D:  12/24/2010  T:  12/25/2010  Job:  [id number]  Electronically Signed by Myrtie Neither MD on 01/02/2011 12:25:54 PM

## 2011-01-02 NOTE — H&P (Signed)
[name], [name]                  ACCOUNT NO.:  [id number]  MEDICAL RECORD NO.:  [id number]  LOCATION:  5016                         FACILITY:  [facility]  PHYSICIAN:  Myrtie Neither, MD      DATE OF BIRTH:  12/12/1950  DATE OF ADMISSION:  12/20/2010 DATE OF DISCHARGE:                             HISTORY & PHYSICAL   CHIEF COMPLAINT:  Painful right total knee.  HISTORY OF PRESENT ILLNESS:  This is a 60 year old female whom I have been following in the office over many years for problems and difficulties with her right knee.  The patient had a right total knee revision several years ago and had developed painful persistent pain in her right total knee.  The patient has been treated with anti- inflammatories and pain medication and had done fairly well for several months.  The patient had therapeutic injection as well.  The patient had x-rays as well as CT scan which did demonstrated loosening of the femoral as well as the tibial component, more so the tibial component. The patient did not have any evidence of infection.  The patient more recently in the last 2 months pain had gotten to the point it was both at rest as well as on ambulation and having difficulty with short distance walking.  PAST MEDICAL HISTORY: 1. Diabetes mellitus. 2. Renal stone. 3. Degenerative joint disease. 4. History of MRSA infection involving the right knee. 5. ORIF right tibial plateau fracture. 6. Removal of hardware, right tibia. 7. Arthroscopy x2, right knee for internal derangement. 8. Right total knee revision, I and D of MRSA infection.  ALLERGIES:  MORPHINE and HYDROMORPHONE, both causing itching and __________.  SOCIAL HISTORY:  Past history of use of tobacco.  The patient has quit several years ago.  Use of alcohol was negative.  Recreational drug is negative.  REVIEW OF SYSTEMS:  No urinary or bowel symptoms.  Some symptoms of hiatal hernia and reflux.  No cardiac or respiratory  symptoms.  FAMILY HISTORY:  Noncontributory.  MEDICATIONS: 1. Omega-3. 2. Multivitamin. 3. Lovastatin 20 mg daily. 4. Lorcet 10 q.6 p.r.n. 5. Lasix 20 mg daily. 6. Calcium carbonate 1200 mg daily. 7. Aspirin 81 mg daily.  PHYSICAL EXAMINATION:  GENERAL:  Alert and oriented.  No acute distress. VITAL SIGNS:  Temperature 97.5, pulse 94, respirations 18, O2 saturation 95%, and blood pressure 97/65.  Weight 125 kg, height 63 inches. HEENT:  Head, normocephalic.  Eyes, conjunctivae and sclerae clear. NECK:  Supple. CHEST:  Clear. CARDIAC:  S1 and S2 regular. EXTREMITIES:  Right knee limited range of motion, markedly tender medial compartment.  Old surgical scars anteriorly, anterior midline incision as well as tibial plateau hockey-stick incision laterally.  Negative Homans test.  Pulses intact.  Sensory is intact.  CT and x-ray revealed loosening femoral and tibial component, right total knee.  IMPRESSION:  Painful right total knee, loose right total knee.  PLAN:  Right total knee revision with possible bone graft.     Myrtie Neither, MD     AC/MEDQ  D:  12/24/2010  T:  12/25/2010  Job:  [id number]  Electronically Signed by Myrtie Neither MD on 01/02/2011 12:26:03  PM

## 2011-01-25 ENCOUNTER — Other Ambulatory Visit: Payer: Self-pay | Admitting: Orthopedic Surgery

## 2011-01-25 ENCOUNTER — Ambulatory Visit
Admission: RE | Admit: 2011-01-25 | Discharge: 2011-01-25 | Disposition: A | Discharge status: Home / Self Care | Payer: Medicare HMO | Source: Ambulatory Visit | Attending: Orthopedic Surgery | Admitting: Orthopedic Surgery

## 2011-01-25 DIAGNOSIS — Z96659 Presence of unspecified artificial knee joint: Secondary | ICD-10-CM

## 2011-01-31 ENCOUNTER — Ambulatory Visit: Payer: Medicare HMO | Attending: Orthopedic Surgery | Admitting: Physical Therapy

## 2011-01-31 DIAGNOSIS — Z96659 Presence of unspecified artificial knee joint: Secondary | ICD-10-CM | POA: Insufficient documentation

## 2011-01-31 DIAGNOSIS — M25569 Pain in unspecified knee: Secondary | ICD-10-CM | POA: Insufficient documentation

## 2011-01-31 DIAGNOSIS — M25669 Stiffness of unspecified knee, not elsewhere classified: Secondary | ICD-10-CM | POA: Insufficient documentation

## 2011-01-31 DIAGNOSIS — R269 Unspecified abnormalities of gait and mobility: Secondary | ICD-10-CM | POA: Insufficient documentation

## 2011-01-31 DIAGNOSIS — IMO0001 Reserved for inherently not codable concepts without codable children: Secondary | ICD-10-CM | POA: Insufficient documentation

## 2011-02-06 ENCOUNTER — Ambulatory Visit: Payer: Medicare HMO | Attending: Orthopedic Surgery | Admitting: Physical Therapy

## 2011-02-06 DIAGNOSIS — IMO0001 Reserved for inherently not codable concepts without codable children: Secondary | ICD-10-CM | POA: Insufficient documentation

## 2011-02-06 DIAGNOSIS — R269 Unspecified abnormalities of gait and mobility: Secondary | ICD-10-CM | POA: Insufficient documentation

## 2011-02-06 DIAGNOSIS — Z96659 Presence of unspecified artificial knee joint: Secondary | ICD-10-CM | POA: Insufficient documentation

## 2011-02-06 DIAGNOSIS — M25569 Pain in unspecified knee: Secondary | ICD-10-CM | POA: Insufficient documentation

## 2011-02-06 DIAGNOSIS — M25669 Stiffness of unspecified knee, not elsewhere classified: Secondary | ICD-10-CM | POA: Insufficient documentation

## 2011-02-07 ENCOUNTER — Ambulatory Visit: Payer: Medicare HMO | Admitting: Physical Therapy

## 2011-02-12 ENCOUNTER — Ambulatory Visit: Payer: Medicare HMO | Admitting: Physical Therapy

## 2011-02-14 ENCOUNTER — Ambulatory Visit: Payer: Medicare HMO | Admitting: Physical Therapy

## 2011-02-19 ENCOUNTER — Ambulatory Visit: Payer: Medicare HMO | Admitting: Physical Therapy

## 2011-02-21 ENCOUNTER — Ambulatory Visit: Payer: Medicare HMO | Admitting: Physical Therapy

## 2011-02-25 ENCOUNTER — Ambulatory Visit (INDEPENDENT_AMBULATORY_CARE_PROVIDER_SITE_OTHER): Payer: Medicare HMO | Admitting: Family Medicine

## 2011-02-25 ENCOUNTER — Encounter: Payer: Self-pay | Admitting: Family Medicine

## 2011-02-25 VITALS — BP 114/67 | HR 107 | Temp 97.6°F | Ht 63.0 in | Wt 275.0 lb

## 2011-02-25 DIAGNOSIS — E119 Type 2 diabetes mellitus without complications: Secondary | ICD-10-CM

## 2011-02-25 DIAGNOSIS — E785 Hyperlipidemia, unspecified: Secondary | ICD-10-CM

## 2011-02-25 DIAGNOSIS — I10 Essential (primary) hypertension: Secondary | ICD-10-CM

## 2011-02-25 DIAGNOSIS — Z23 Encounter for immunization: Secondary | ICD-10-CM

## 2011-02-25 LAB — COMPREHENSIVE METABOLIC PANEL
ALT: 22 U/L (ref 0–35)
Alkaline Phosphatase: 108 U/L (ref 39–117)
CO2: 28 mEq/L (ref 19–32)
Sodium: 143 mEq/L (ref 135–145)
Total Bilirubin: 0.3 mg/dL (ref 0.3–1.2)
Total Protein: 7.3 g/dL (ref 6.0–8.3)

## 2011-02-25 LAB — LIPID PANEL
LDL Cholesterol: 107 mg/dL — ABNORMAL HIGH (ref 0–99)
VLDL: 29 mg/dL (ref 0–40)

## 2011-02-25 LAB — CBC
MCH: 27.3 pg (ref 26.0–34.0)
MCHC: 31.3 g/dL (ref 30.0–36.0)
Platelets: 262 10*3/uL (ref 150–400)
RBC: 4.83 MIL/uL (ref 3.87–5.11)

## 2011-02-25 MED ORDER — ZOSTER VACCINE LIVE 19400 UNT/0.65ML ~~LOC~~ SOLR: 0.6500 mL | Freq: Once | SUBCUTANEOUS | Status: DC

## 2011-02-25 NOTE — Patient Instructions (Signed)
It was nice to meet you. Please schedule an annual complete physical with me at your earliest convenience. Thank you, Dr. Lajoyce Corners de [place] Come

## 2011-02-25 NOTE — Assessment & Plan Note (Signed)
Alc stable today @ 6.4.  No medication at this time - controlled with diet. Has not been able to exercise since knee revision surgery in July. CBG at home typically between 100-200. Will order ref for ophthalmology. Encourage CHO-modified diet and activity ad lib per surgery.

## 2011-02-25 NOTE — Progress Notes (Signed)
  Subjective:    Patient ID: [name], female    DOB: Dec 13, 1950, 60 y.o.   MRN: [id number]  HPI    Review of Systems     Objective:   Physical Exam        Assessment & Plan:   Subjective:     [name] is a 60 y.o. female who presents for follow up of diabetes.. Current symptoms include: none. Patient denies foot ulcerations, hyperglycemia, hypoglycemia , nausea, paresthesia of the feet, polydipsia, polyuria, visual disturbances, vomiting and weight loss. Evaluation to date has been: fasting blood sugar, fasting lipid panel and hemoglobin A1C. Home sugars: BGs consistently in an acceptable range. Current treatments: more intensive attention to diet which has been effective and increased aerobic exercise which has been put on hold due to recent knee revision surgery in July.. Last dilated eye exam: unknown, never made it to eye doctor.  Review of Systems Pertinent items are noted in HPI.    Objective:    General appearance: alert, cooperative and no distress Eyes: conjunctivae/corneas clear. PERRL, EOM's intact. Fundi benign. Neck: no adenopathy and supple, symmetrical, trachea midline Lungs: clear to auscultation bilaterally Heart: regular rate and rhythm, S1, S2 normal, no murmur, click, rub or gallop Abdomen: soft, non-tender; bowel sounds normal; no masses,  no organomegaly Extremities: extremities normal, atraumatic, no cyanosis or edema Skin: Skin color, texture, turgor normal. No rashes or lesions   Normal sensation in feet bilaterally; no open wounds or ulcers  Laboratory: No components found with this basename: A1C      Assessment:    Diabetes mellitus Type II, under excellent control.    Plan:    Discussed general issues about diabetes pathophysiology and management. Addressed ADA diet. Encouraged aerobic exercise. Discussed foot care. Reminded to get yearly retinal exam. Reminded to bring in blood sugar diary at next visit. Follow up in 3 months  or as needed.

## 2011-02-25 NOTE — Assessment & Plan Note (Signed)
Will order fasting lipid panel, CBC, and CMET today. Will send letter when results come back.

## 2011-02-25 NOTE — Assessment & Plan Note (Signed)
At goal today: 114/67 - on Lasix 20 daily for swelling.

## 2011-02-26 ENCOUNTER — Ambulatory Visit: Payer: Medicare HMO | Admitting: Physical Therapy

## 2011-02-28 ENCOUNTER — Ambulatory Visit: Payer: Medicare HMO | Admitting: Physical Therapy

## 2011-03-06 ENCOUNTER — Ambulatory Visit: Payer: Medicare HMO | Attending: Orthopedic Surgery | Admitting: Physical Therapy

## 2011-03-06 DIAGNOSIS — M25569 Pain in unspecified knee: Secondary | ICD-10-CM | POA: Insufficient documentation

## 2011-03-06 DIAGNOSIS — Z96659 Presence of unspecified artificial knee joint: Secondary | ICD-10-CM | POA: Insufficient documentation

## 2011-03-06 DIAGNOSIS — R269 Unspecified abnormalities of gait and mobility: Secondary | ICD-10-CM | POA: Insufficient documentation

## 2011-03-06 DIAGNOSIS — IMO0001 Reserved for inherently not codable concepts without codable children: Secondary | ICD-10-CM | POA: Insufficient documentation

## 2011-03-06 DIAGNOSIS — M25669 Stiffness of unspecified knee, not elsewhere classified: Secondary | ICD-10-CM | POA: Insufficient documentation

## 2011-03-08 ENCOUNTER — Ambulatory Visit (INDEPENDENT_AMBULATORY_CARE_PROVIDER_SITE_OTHER): Payer: Medicare HMO | Admitting: *Deleted

## 2011-03-08 ENCOUNTER — Ambulatory Visit: Payer: Medicare HMO | Admitting: Rehabilitation

## 2011-03-08 VITALS — Temp 98.2°F

## 2011-03-08 DIAGNOSIS — Z23 Encounter for immunization: Secondary | ICD-10-CM

## 2011-03-08 LAB — BODY FLUID CULTURE

## 2011-03-08 LAB — ANAEROBIC CULTURE

## 2011-03-08 MED ORDER — PNEUMOCOCCAL VAC POLYVALENT 25 MCG/0.5ML IJ INJ: 0.5000 mL | INJECTION | Freq: Once | INTRAMUSCULAR | Status: DC

## 2011-03-11 LAB — COMPREHENSIVE METABOLIC PANEL
ALT: 16
Albumin: 3.7
Alkaline Phosphatase: 111
BUN: 14
Chloride: 105
Glucose, Bld: 96
Potassium: 3.8
Total Bilirubin: 0.8

## 2011-03-11 LAB — URINALYSIS, ROUTINE W REFLEX MICROSCOPIC
Hgb urine dipstick: NEGATIVE
Ketones, ur: NEGATIVE
Protein, ur: NEGATIVE
Urobilinogen, UA: 0.2

## 2011-03-11 LAB — CBC
HCT: 37.1
Hemoglobin: 12
Platelets: 286
WBC: 6.7

## 2011-03-12 ENCOUNTER — Ambulatory Visit: Payer: Medicare HMO | Admitting: Physical Therapy

## 2011-03-14 ENCOUNTER — Ambulatory Visit: Payer: Medicare HMO | Admitting: Physical Therapy

## 2011-03-15 ENCOUNTER — Ambulatory Visit
Admission: RE | Admit: 2011-03-15 | Discharge: 2011-03-15 | Disposition: A | Discharge status: Home / Self Care | Payer: Medicare HMO | Source: Ambulatory Visit | Attending: Orthopedic Surgery | Admitting: Orthopedic Surgery

## 2011-03-15 ENCOUNTER — Other Ambulatory Visit: Payer: Self-pay | Admitting: Orthopedic Surgery

## 2011-03-15 DIAGNOSIS — R52 Pain, unspecified: Secondary | ICD-10-CM

## 2011-03-19 ENCOUNTER — Ambulatory Visit: Payer: Medicare HMO | Admitting: Physical Therapy

## 2011-03-21 ENCOUNTER — Ambulatory Visit: Payer: Medicare HMO | Admitting: Physical Therapy

## 2011-03-26 ENCOUNTER — Ambulatory Visit: Payer: Medicare HMO | Admitting: Physical Therapy

## 2011-03-28 ENCOUNTER — Ambulatory Visit: Payer: Medicare HMO | Admitting: Physical Therapy

## 2011-04-02 ENCOUNTER — Ambulatory Visit: Payer: Medicare HMO | Admitting: Physical Therapy

## 2011-04-04 ENCOUNTER — Ambulatory Visit: Payer: Medicare HMO | Attending: Orthopedic Surgery | Admitting: Physical Therapy

## 2011-04-04 DIAGNOSIS — IMO0001 Reserved for inherently not codable concepts without codable children: Secondary | ICD-10-CM | POA: Insufficient documentation

## 2011-04-04 DIAGNOSIS — Z96659 Presence of unspecified artificial knee joint: Secondary | ICD-10-CM | POA: Insufficient documentation

## 2011-04-04 DIAGNOSIS — R269 Unspecified abnormalities of gait and mobility: Secondary | ICD-10-CM | POA: Insufficient documentation

## 2011-04-09 ENCOUNTER — Ambulatory Visit: Payer: Medicare HMO | Attending: Orthopedic Surgery | Admitting: Physical Therapy

## 2011-04-09 DIAGNOSIS — R269 Unspecified abnormalities of gait and mobility: Secondary | ICD-10-CM | POA: Insufficient documentation

## 2011-04-09 DIAGNOSIS — IMO0001 Reserved for inherently not codable concepts without codable children: Secondary | ICD-10-CM | POA: Insufficient documentation

## 2011-04-09 DIAGNOSIS — Z96659 Presence of unspecified artificial knee joint: Secondary | ICD-10-CM | POA: Insufficient documentation

## 2011-04-11 ENCOUNTER — Ambulatory Visit: Payer: Medicare HMO | Attending: Orthopedic Surgery | Admitting: Physical Therapy

## 2011-04-11 DIAGNOSIS — R269 Unspecified abnormalities of gait and mobility: Secondary | ICD-10-CM | POA: Insufficient documentation

## 2011-04-11 DIAGNOSIS — IMO0001 Reserved for inherently not codable concepts without codable children: Secondary | ICD-10-CM | POA: Insufficient documentation

## 2011-04-11 DIAGNOSIS — Z96659 Presence of unspecified artificial knee joint: Secondary | ICD-10-CM | POA: Insufficient documentation

## 2011-04-29 ENCOUNTER — Ambulatory Visit
Admission: RE | Admit: 2011-04-29 | Discharge: 2011-04-29 | Disposition: A | Discharge status: Home / Self Care | Payer: Medicare HMO | Source: Ambulatory Visit | Attending: Orthopedic Surgery | Admitting: Orthopedic Surgery

## 2011-04-29 ENCOUNTER — Other Ambulatory Visit: Payer: Self-pay | Admitting: Orthopedic Surgery

## 2011-04-29 DIAGNOSIS — M545 Low back pain, unspecified: Secondary | ICD-10-CM

## 2011-05-03 ENCOUNTER — Ambulatory Visit
Admission: RE | Admit: 2011-05-03 | Discharge: 2011-05-03 | Disposition: A | Discharge status: Home / Self Care | Payer: Medicare HMO | Source: Ambulatory Visit | Attending: Orthopedic Surgery | Admitting: Orthopedic Surgery

## 2011-05-03 ENCOUNTER — Other Ambulatory Visit: Payer: Self-pay | Admitting: Orthopedic Surgery

## 2011-05-03 DIAGNOSIS — R52 Pain, unspecified: Secondary | ICD-10-CM

## 2011-05-16 ENCOUNTER — Telehealth: Payer: Self-pay | Admitting: Family Medicine

## 2011-05-16 NOTE — Telephone Encounter (Signed)
I will be back in the office Monday 12/17 and I will complete form and fax at that time.  Thank you.

## 2011-05-16 NOTE — Telephone Encounter (Signed)
Patient dropped off form to be filled out for diabetic supplies.  Please fax when completed.  Placed form in dr's box.

## 2011-05-17 NOTE — Telephone Encounter (Signed)
Spoke with patient and informed her of below 

## 2011-05-30 ENCOUNTER — Encounter: Payer: Medicare HMO | Admitting: Family Medicine

## 2011-06-05 ENCOUNTER — Ambulatory Visit (INDEPENDENT_AMBULATORY_CARE_PROVIDER_SITE_OTHER): Payer: Medicare Other | Admitting: Family Medicine

## 2011-06-05 VITALS — BP 131/76 | HR 95 | Temp 97.0°F | Ht 63.0 in | Wt 294.0 lb

## 2011-06-05 DIAGNOSIS — E119 Type 2 diabetes mellitus without complications: Secondary | ICD-10-CM

## 2011-06-05 DIAGNOSIS — I10 Essential (primary) hypertension: Secondary | ICD-10-CM

## 2011-06-05 DIAGNOSIS — Z0000 Encounter for general adult medical examination without abnormal findings: Secondary | ICD-10-CM

## 2011-06-05 LAB — POCT GLYCOSYLATED HEMOGLOBIN (HGB A1C): Hemoglobin A1C: 7.1

## 2011-06-05 MED ORDER — METFORMIN HCL 500 MG PO TABS: 500.0000 mg | ORAL_TABLET | Freq: Two times a day (BID) | ORAL | Status: DC

## 2011-06-05 NOTE — Assessment & Plan Note (Signed)
Blood pressure currently 131/76. No home medications. Follow up in 3 months.

## 2011-06-05 NOTE — Patient Instructions (Signed)
It was good to see you today. Your A1c today: 7.1. Start taking Metformin 500 mg PO BID. I will re-order DM supplies and refer you to an eye doctor. [facility]. Schedule a follow up appointment with me in 3 months. Continue to swim, walk, and increase physical activity.   Diets for Diabetes, Food Labeling Look at food labels to help you decide how much of a product you can eat. You will want to check the amount of total carbohydrate in a serving to see how the food fits into your meal plan. In the list of ingredients, the ingredient present in the largest amount by weight must be listed first, followed by the other ingredients in descending order. STANDARD OF IDENTITY Most products have a list of ingredients. However, foods that the Food and Drug Administration (FDA) has given a standard of identity do not need a list of ingredients. A standard of identity means that a food must contain certain ingredients if it is called a particular name. Examples are mayonnaise, peanut butter, ketchup, jelly, and cheese. LABELING TERMS There are many terms found on food labels. Some of these terms have specific definitions. Some terms are regulated by the FDA, and the FDA has clearly specified how they can be used. Others are not regulated or well-defined and can be misleading and confusing. SPECIFICALLY DEFINED TERMS Nutritive Sweetener.  A sweetener that contains calories,such as table sugar or honey.  Nonnutritive Sweetener.  A sweetener with few or no calories,such as saccharin, aspartame, sucralose, and cyclamate.  LABELING TERMS REGULATED BY THE FDA Free.  The product contains only a tiny or small amount of fat, cholesterol, sodium, sugar, or calories. For example, a "fat-free" product will contain less than 0.5 g of fat per serving.  Low.  A food described as "low" in fat, saturated fat, cholesterol, sodium, or calories could be  eaten fairly often without exceeding dietary guidelines. For example, "low in fat" means no more than 3 g of fat per serving.  Lean.  "Lean" and "extra lean" are U.S. Department of Agriculture Architect) terms for use on meat and poultry products. "Lean" means the product contains less than 10 g of fat, 4 g of saturated fat, and 95 mg of cholesterol per serving. "Lean" is not as low in fat as a product labeled "low."  Extra Lean.  "Extra lean" means the product contains less than 5 g of fat, 2 g of saturated fat, and 95 mg of cholesterol per serving. While "extra lean" has less fat than "lean," it is still higher in fat than a product labeled "low."  Reduced, Less, Fewer.  A diet product that contains 25% less of a nutrient or calories than the regular version. For example, hot dogs might be labeled "25% less fat than our regular hot dogs."  Light/Lite.  A diet product that contains ? fewer calories or  the fat of the original. For example, "light in sodium" means a product with  the usual sodium.  More.  One serving contains at least 10% more of the daily value of a vitamin, mineral, or fiber than usual.  Good Source Of.  One serving contains 10% to 19% of the daily value for a particular vitamin, mineral, or fiber.  Excellent Source Of.  One serving contains 20% or more of the daily value for a particular nutrient. Other terms used might be "high in" or "rich in."  Enriched or Fortified.  The product contains added vitamins, minerals, or protein. Nutrition labeling must be used on enriched or fortified foods.  Imitation.  The product has been altered so that it is lower in protein, vitamins, or minerals than the usual food,such as imitation peanut butter.  Total Fat.  The number listed is the total of all fat found in a serving of the product. Under total fat, food labels must list saturated fat and trans fat, which are associated with raising bad cholesterol and an increased risk of  heart blood vessel disease.  Saturated Fat.  Mainly fats from animal-based sources. Some examples are red meat, cheese, cream, whole milk, and coconut oil.  Trans Fat.  Found in some fried snack foods, packaged foods, and fried restaurant foods. It is recommended you eat as close to 0 g of trans fat as possible, since it raises bad cholesterol and lowers good cholesterol.  Polyunsaturated and Monounsaturated Fats.  More healthful fats. These fats are from plant sources.  Total Carbohydrate.  The number of carbohydrate grams in a serving of the product. Under total carbohydrate are listed the other carbohydrate sources, such as dietary fiber and sugars.  Dietary Fiber.  A carbohydrate from plant sources.  Sugars.  Sugars listed on the label contain all naturally occurring sugars as well as added sugars.  LABELING TERMS NOT REGULATED BY THE FDA Sugarless.  Table sugar (sucrose) has not been added. However, the manufacturer may use another form of sugar in place of sucrose to sweeten the product. For example, sugar alcohols are used to sweeten foods. Sugar alcohols are a form of sugar but are not table sugar. If a product contains sugar alcohols in place of sucrose, it can still be labeled "sugarless."  Low Salt, Salt-Free, Unsalted, No Salt, No Salt Added, Without Added Salt.  Food that is usually processed with salt has been made without salt. However, the food may contain sodium-containing additives, such as preservatives, leavening agents, or flavorings.  Natural.  This term has no legal meaning.  Organic.  Foods that are certified as organic have been inspected and approved by the USDA to ensure they are produced without pesticides, fertilizers containing synthetic ingredients, bioengineering, or ionizing radiation.  Document Released: 05/23/2003 Document Revised: 01/30/2011 Document Reviewed: 12/08/2008 [facility] Patient Information 2012 [place], [place].

## 2011-06-05 NOTE — Assessment & Plan Note (Signed)
Patient was off metformin because blood sugars were well-controlled. A1c in September was 6.4. Now A1c is 7.1. Will restart metformin 500 mg twice a day. Encourage weight loss and carb counting. Encouraged followup appointment with diabetes and nutrition educator. Followup with me in 3 months.

## 2011-06-05 NOTE — Progress Notes (Signed)
  Subjective:     [name] is a 61 y.o. female and is here for a comprehensive physical exam. The patient reports gaining weight after knee replacement surgery in July.  Says CBGs have been elevated, ranging from 110-190.  Admits to not eating CHO modified diet, but does go to [facility] for pool exercises.  Does endorse blurry vision, headache, malaise when BG are elevated.  Checks her feet frequently for ulcers, but denies any decreased sensation in bilateral extremities.  Needs referral for eye doctor.   Health Maintenance  Topic Date Due  . Pap Smear  11/27/1966  . Colonoscopy  11/27/1998  . Zostavax  11/26/2008  . Influenza Vaccine  03/03/2012  . Mammogram  09/26/2012  . Tetanus/tdap  05/09/2016    The following portions of the patient's history were reviewed and updated as appropriate: allergies, current medications, past family history, past social history, past surgical history and problem list.  Review of Systems Pertinent items are noted in HPI.   Objective:    General appearance: alert, cooperative and no distress Neck: no adenopathy, supple, symmetrical, trachea midline and thyroid not enlarged, symmetric, no tenderness/mass/nodules Lungs: clear to auscultation bilaterally Heart: regular rate and rhythm and systolic murmur: systolic ejection 2/6 Abdomen: soft, non-tender; bowel sounds normal; no masses,  no organomegaly Extremities: extremities normal, atraumatic, no cyanosis or edema Skin: Skin color, texture, turgor normal. No rashes or lesions Neurologic: Grossly normal , 3/5 strength R lower extremity, 5/5 LLE   Assessment:    Healthy female exam. Diabetes Type 2.     Plan:     1. No pap today secondary to hysterectomy in 1975. 2. DM, type 2: see AVS and Problem List. 3. Normal well woman exam - follow up in 3 months for DM.  Encouraged increasing physical activity and carb counting.    See After Visit Summary for Counseling Recommendations

## 2011-08-28 ENCOUNTER — Ambulatory Visit
Admission: RE | Admit: 2011-08-28 | Discharge: 2011-08-28 | Disposition: A | Discharge status: Home / Self Care | Payer: Medicare Other | Source: Ambulatory Visit | Attending: Orthopedic Surgery | Admitting: Orthopedic Surgery

## 2011-08-28 ENCOUNTER — Encounter: Payer: Self-pay | Admitting: Family Medicine

## 2011-08-28 ENCOUNTER — Ambulatory Visit (INDEPENDENT_AMBULATORY_CARE_PROVIDER_SITE_OTHER): Payer: Medicare Other | Admitting: Family Medicine

## 2011-08-28 ENCOUNTER — Other Ambulatory Visit: Payer: Self-pay | Admitting: Orthopedic Surgery

## 2011-08-28 VITALS — BP 128/83 | HR 91 | Ht 63.0 in | Wt 285.0 lb

## 2011-08-28 DIAGNOSIS — R52 Pain, unspecified: Secondary | ICD-10-CM

## 2011-08-28 DIAGNOSIS — E119 Type 2 diabetes mellitus without complications: Secondary | ICD-10-CM

## 2011-08-28 LAB — POCT UA - MICROALBUMIN: Albumin/Creatinine Ratio, Urine, POC: <30

## 2011-08-28 NOTE — Patient Instructions (Signed)
Your blood pressure and sugar level are both at goal.  Keep up the good work. Talk to [employer] about healthy weight loss. If you have any questions, please let me know. Otherwise, schedule a follow up appointment with me in 3-4 months.

## 2011-09-01 NOTE — Progress Notes (Signed)
  Subjective:     [name] is a 61 y.o. female who presents for follow up of diabetes.. Current symptoms include: none. Patient denies foot ulcerations, hyperglycemia, hypoglycemia , nausea, paresthesia of the feet, visual disturbances and vomiting. Evaluation to date has been: hemoglobin A1C. Home sugars: BGs have been labile ranging between 87 and 130s. Current treatments: Metformin 500 BID.  The following portions of the patient's history were reviewed and updated as appropriate: allergies, current medications, past medical history, past social history and problem list.  Review of Systems Pertinent items are noted in HPI.    Objective:    General appearance: alert, cooperative and no distress Lungs: clear to auscultation bilaterally Heart: regular rate and rhythm, S1, S2 normal, no murmur, click, rub or gallop Abdomen: soft, non-tender; bowel sounds normal; no masses,  no organomegaly Extremities: extremities normal, atraumatic, no cyanosis or edema   Foot exam: no open ulcers, wounds, pin prick sensation intact.  Laboratory: No components found with this basename: A1C  .    Assessment:    Diabetes mellitus Type II, under good control.    Plan:   Will order BMET today.  Encouraged aerobic exercise. Discussed foot care. Reminded to get yearly retinal exam. Reminded to bring in blood sugar diary at next visit. Follow up in 3 months or as needed.

## 2011-09-01 NOTE — Assessment & Plan Note (Signed)
Metformin 500 BID restarted in January due to increase in a1c from 6.4 to 7.1.  Will order A1c and BMET today.  Encouraged weight loss and increase in physical activity.  Follow up in 3 months.

## 2011-09-02 ENCOUNTER — Other Ambulatory Visit: Payer: Self-pay | Admitting: Family Medicine

## 2011-09-02 NOTE — Telephone Encounter (Signed)
Patient is calling for a refill on Metformin to be sent [facility] on [address].

## 2011-09-03 ENCOUNTER — Other Ambulatory Visit: Payer: Self-pay | Admitting: Family Medicine

## 2011-09-03 ENCOUNTER — Telehealth: Payer: Self-pay | Admitting: Family Medicine

## 2011-09-03 DIAGNOSIS — Z1231 Encounter for screening mammogram for malignant neoplasm of breast: Secondary | ICD-10-CM

## 2011-09-03 MED ORDER — METFORMIN HCL 500 MG PO TABS: 500.0000 mg | ORAL_TABLET | Freq: Two times a day (BID) | ORAL | Status: DC

## 2011-09-03 NOTE — Telephone Encounter (Signed)
This patient receives 90 day supply and had 5 refills.  She should have enough for awhile.

## 2011-09-03 NOTE — Telephone Encounter (Signed)
Spoke with patient and informed her that we are sending rx in

## 2011-09-30 ENCOUNTER — Ambulatory Visit (HOSPITAL_COMMUNITY)
Admission: RE | Admit: 2011-09-30 | Discharge: 2011-09-30 | Disposition: A | Discharge status: Home / Self Care | Payer: Medicare Other | Source: Ambulatory Visit | Attending: Family Medicine | Admitting: Family Medicine

## 2011-09-30 DIAGNOSIS — Z1231 Encounter for screening mammogram for malignant neoplasm of breast: Secondary | ICD-10-CM | POA: Insufficient documentation

## 2011-10-08 ENCOUNTER — Telehealth: Payer: Self-pay | Admitting: Family Medicine

## 2011-10-08 MED ORDER — GLUCOSE BLOOD VI STRP: ORAL_STRIP | Status: DC

## 2011-10-08 NOTE — Telephone Encounter (Signed)
Informed pt that strips have been sent in to [facility] on [address].[name]

## 2011-10-08 NOTE — Telephone Encounter (Signed)
Patient needs Prodigy Test Strips sent to [facility] on [address].  Since she has changed her insurance, she has found out that she had to change who her Rx's come through.  She does not have any test strips.

## 2011-10-08 NOTE — Telephone Encounter (Signed)
Sent to [facility] on [address].   

## 2011-12-02 ENCOUNTER — Ambulatory Visit: Payer: Medicare Other | Admitting: Family Medicine

## 2011-12-09 ENCOUNTER — Ambulatory Visit (INDEPENDENT_AMBULATORY_CARE_PROVIDER_SITE_OTHER): Payer: Medicare Other | Admitting: Family Medicine

## 2011-12-09 ENCOUNTER — Encounter: Payer: Self-pay | Admitting: Family Medicine

## 2011-12-09 VITALS — BP 103/59 | HR 81 | Temp 97.0°F | Ht 63.0 in | Wt 294.0 lb

## 2011-12-09 DIAGNOSIS — E785 Hyperlipidemia, unspecified: Secondary | ICD-10-CM

## 2011-12-09 DIAGNOSIS — E119 Type 2 diabetes mellitus without complications: Secondary | ICD-10-CM

## 2011-12-09 LAB — COMPREHENSIVE METABOLIC PANEL
ALT: 13 U/L (ref 0–35)
AST: 16 U/L (ref 0–37)
Albumin: 3.8 g/dL (ref 3.5–5.2)
Alkaline Phosphatase: 93 U/L (ref 39–117)
BUN: 18 mg/dL (ref 6–23)
Calcium: 9.4 mg/dL (ref 8.4–10.5)
Chloride: 105 mEq/L (ref 96–112)
Potassium: 4.3 mEq/L (ref 3.5–5.3)
Sodium: 140 mEq/L (ref 135–145)
Total Protein: 6.3 g/dL (ref 6.0–8.3)

## 2011-12-09 LAB — LIPID PANEL: LDL Cholesterol: 100 mg/dL — ABNORMAL HIGH (ref 0–99)

## 2011-12-09 LAB — POCT GLYCOSYLATED HEMOGLOBIN (HGB A1C): Hemoglobin A1C: 6.6

## 2011-12-09 NOTE — Patient Instructions (Addendum)
It was great to see you again. I will call or send a letter with results of blood work in the next 1-2 days. Please work with PT regarding increasing exercise as tolerated. Continue eating a low carb diet. Schedule follow up appointment with me in 3 months.Marland Kitchenand hopefully your weight will be down! Please call MD or return to clinic as needed.

## 2011-12-09 NOTE — Assessment & Plan Note (Signed)
Continue statin.  Repeat lipid panel today.

## 2011-12-09 NOTE — Assessment & Plan Note (Signed)
A1c today 6.6, same 4 months ago, despite patient's 9 lb weight gain since last visit. Continue Metformin 500 BID.  Repeat CMET and Lipid panel today. Encouraged patient to speak to PT regarding increasing physical activity as tolerated. Continue to eat low carb diet.  Patient considering [employer].  Does not wish to meet with Nutrition yet. She has an eye doctor, recommended annual appointments. Follow up with me in 3 months.

## 2011-12-09 NOTE — Progress Notes (Signed)
  Subjective:     [name] is a 61 y.o. female who presents for follow up of diabetes. Current symptoms include: none. Patient denies foot ulcerations, increased appetite, nausea, polydipsia, polyuria, visual disturbances, vomiting and weight loss. In fact, she has gained 9 lb in the last 4 months.  She states her diet is healthy, but due to recent knee RT surgery, she has not been able to exercise.  She does, however, meet with her PT and has been doing water aerobics.   Evaluation to date has been: hemoglobin A1C.  She checks her feet weekly and has not noticed any open wounds.  O occasion, she endorses numbness/tingling sensation of RT toes, but her orthopedic surgeon says this could be from knee surgery.  Home sugars: patient did not record since last visit.. Current treatments: Continued metformin which has been effective. Last dilated eye exam: last year.  She needs to make an appointment this year.  The following portions of the patient's history were reviewed and updated as appropriate: allergies, current medications, past medical history, past surgical history and problem list.  Review of Systems Pertinent items are noted in HPI.    Objective:    Head: Normocephalic, without obvious abnormality, atraumatic Lungs: clear to auscultation bilaterally Heart: regular rate and rhythm, S1, S2 normal, no murmur, click, rub or gallop Extremities: extremities normal, atraumatic, no cyanosis or edema Skin: Skin color, texture, turgor normal. No rashes or lesions or diabetic foot exam normal pin-prick sensation; no open wounds or ulcers.    Laboratory: No components found with this basename: A1C      Assessment:    Diabetes mellitus Type II, under good control.    Plan:    See Problem List.

## 2011-12-10 ENCOUNTER — Encounter: Payer: Self-pay | Admitting: Family Medicine

## 2011-12-31 ENCOUNTER — Other Ambulatory Visit: Payer: Self-pay | Admitting: Orthopedic Surgery

## 2011-12-31 ENCOUNTER — Ambulatory Visit
Admission: RE | Admit: 2011-12-31 | Discharge: 2011-12-31 | Disposition: A | Discharge status: Home / Self Care | Payer: Medicare Other | Source: Ambulatory Visit | Attending: Orthopedic Surgery | Admitting: Orthopedic Surgery

## 2011-12-31 DIAGNOSIS — R52 Pain, unspecified: Secondary | ICD-10-CM

## 2012-03-07 ENCOUNTER — Other Ambulatory Visit: Payer: Self-pay | Admitting: Family Medicine

## 2012-03-26 ENCOUNTER — Ambulatory Visit
Admission: RE | Admit: 2012-03-26 | Discharge: 2012-03-26 | Disposition: A | Discharge status: Home / Self Care | Payer: Medicare Other | Source: Ambulatory Visit | Attending: Orthopedic Surgery | Admitting: Orthopedic Surgery

## 2012-03-26 ENCOUNTER — Other Ambulatory Visit: Payer: Self-pay | Admitting: Orthopedic Surgery

## 2012-03-26 DIAGNOSIS — M869 Osteomyelitis, unspecified: Secondary | ICD-10-CM

## 2012-04-01 ENCOUNTER — Encounter: Payer: Self-pay | Admitting: Family Medicine

## 2012-04-01 ENCOUNTER — Ambulatory Visit (INDEPENDENT_AMBULATORY_CARE_PROVIDER_SITE_OTHER): Payer: Medicare Other | Admitting: Family Medicine

## 2012-04-01 VITALS — BP 121/64 | HR 82 | Temp 98.0°F | Ht 63.0 in | Wt 275.6 lb

## 2012-04-01 DIAGNOSIS — E119 Type 2 diabetes mellitus without complications: Secondary | ICD-10-CM

## 2012-04-01 DIAGNOSIS — Z23 Encounter for immunization: Secondary | ICD-10-CM

## 2012-04-01 LAB — POCT GLYCOSYLATED HEMOGLOBIN (HGB A1C): Hemoglobin A1C: 6.1

## 2012-04-01 NOTE — Patient Instructions (Addendum)
Your Hemoglobin A1c dropped from 6.6 to 6.1 today!!  Congratulations. You have also lost about 20 lbs in 3 months with [facility] [employer].  Keep up the good work, [name]. Continue Metformin and Lovastatin daily. Return to clinic in 3 months for diabetes follow up. Please see an eye doctor once a year. Have a wonderful week.  Basic Carbohydrate Counting Basic carbohydrate counting is a way to plan meals. It is done by counting the amount of carbohydrate in foods. Foods that have carbohydrates are starches (grains, beans, starchy vegetables) and sweets. Eating carbohydrates increases blood glucose (sugar) levels. People with diabetes use carbohydrate counting to help keep their blood glucose at a normal level.  COUNTING CARBOHYDRATES IN FOODS The first step in counting carbohydrates is to learn how many carbohydrate servings you should have in every meal. A dietitian can plan this for you. After learning the amount of carbohydrates to include in your meal plan, you can start to choose the carbohydrate-containing foods you want to eat.  There are 2 ways to identify the amount of carbohydrates in the foods you eat.  Read the Nutrition Facts panel on food labels. You need 2 pieces of information from the Nutrition Facts panel to count carbohydrates this way:  Serving size.  Total carbohydrate (in grams). Decide how many servings you will be eating. If it is 1 serving, you will be eating the amount of carbohydrate listed on the panel. If you will be eating 2 servings, you will be eating double the amount of carbohydrate listed on the panel.   Learn serving sizes. A serving size of most carbohydrate-containing foods is about 15 grams (g). Listed below are single serving sizes of common carbohydrate-containing foods:  1 slice bread.   cup unsweetened, dry cereal.   cup hot cereal.   cup rice.   cup mashed potatoes.   cup pasta.  1 cup fresh fruit.   cup canned fruit.  1 cup milk (whole,  2%, or skim).   cup starchy vegetables (peas, corn, or potatoes). Counting carbohydrates this way is similar to looking on the Nutrition Facts panel. Decide how many servings you will eat first. Multiply the number of servings you eat by 15 g. For example, if you have 2 cups of strawberries, you had 2 servings. That means you had 30 g of carbohydrate (2 servings x 15 g = 30 g). CALCULATING CARBOHYDRATES IN A MEAL Sample dinner  3 oz chicken breast.   cup brown rice.   cup corn.  1 cup fat-free milk.  1 cup strawberries with sugar-free whipped topping. Carbohydrate calculation First, identify the foods that contain carbohydrate:  Rice.  Corn.  Milk.  Strawberries. Calculate the number of servings eaten:  2 servings rice.  1 serving corn.  1 serving milk.  1 serving strawberries. Multiply the number of servings by 15 g:  2 servings rice x 15 g = 30 g.  1 serving corn x 15 g = 15 g.  1 serving milk x 15 g = 15 g.  1 serving strawberries x 15 g = 15 g. Add the amounts to find the total carbohydrates eaten: 30 g + 15 g + 15 g + 15 g = 75 g carbohydrate eaten at dinner. Document Released: 05/20/2005 Document Revised: 08/12/2011 Document Reviewed: 04/05/2011 [facility] Patient Information 2013 [place], [place].

## 2012-04-01 NOTE — Progress Notes (Signed)
  Subjective:     [name] is a 61 y.o. female who presents for follow up of diabetes.. Current symptoms include: None.  She has lost about 20 lbs in 3 months with [employer].  She has been watching her food intake carefully.  Patient is frustrated because she feels like she is not losing weight fast enough.  She swims in the morning, but exercise is limited due to her chronic knee pain.  Patient denies foot ulcerations, paresthesia of the feet and visual disturbances. Evaluation to date has been: hemoglobin A1C. Home sugars: patient does not check sugars. Current treatments: Metformin 500 mg BID. Last dilated eye exam 2 years ago.  Review of Systems  Per HPI   Objective:    General appearance: alert, cooperative and no distress Extremities: brace over RT shin; trace pitting pedal edema bilaterally Skin: Skin color, texture, turgor normal. No rashes or lesions or no open ulcers or callouses       Assessment:    Diabetes mellitus Type II, under excellent control.    Plan:    See Problem List

## 2012-04-01 NOTE — Assessment & Plan Note (Signed)
Hemoglobin A1c today 6.1.  Continue Metformin 500 BID.  Continue healthy amount of weight loss with [employer].  Encouraged patient to find an eye doctor for annual check ups.  Follow up with me in 3 months or sooner as needed.

## 2012-04-29 ENCOUNTER — Telehealth: Payer: Self-pay | Admitting: Family Medicine

## 2012-04-29 NOTE — Telephone Encounter (Signed)
Pt wakes up with numbness & pain in hands/forearm for the last 3 days, would like to speak to nurse as to what she can do.

## 2012-04-29 NOTE — Telephone Encounter (Signed)
Hands have been numb and wakes patient up at night with pain.  Our office will be closed for holiday tomorrow and Friday.  Offered to have patient go to ED or urgent care for evaluation is unable to wait until Monday.  Patient prefers to wait until Monday (05/04/12).  Appt scheduled for 05/04/12 with Dr. Cristal Ford for 9:45 am.  Patient will go to ED or UC if symptoms worsen and she is unable to wait until Monday.  Gaylene Brooks, RN

## 2012-05-04 ENCOUNTER — Ambulatory Visit (INDEPENDENT_AMBULATORY_CARE_PROVIDER_SITE_OTHER): Payer: Medicare Other | Admitting: Family Medicine

## 2012-05-04 ENCOUNTER — Encounter: Payer: Self-pay | Admitting: Family Medicine

## 2012-05-04 VITALS — BP 128/58 | HR 77 | Temp 97.9°F | Ht 63.0 in | Wt 278.2 lb

## 2012-05-04 DIAGNOSIS — R200 Anesthesia of skin: Secondary | ICD-10-CM

## 2012-05-04 DIAGNOSIS — R209 Unspecified disturbances of skin sensation: Secondary | ICD-10-CM

## 2012-05-04 DIAGNOSIS — R202 Paresthesia of skin: Secondary | ICD-10-CM | POA: Insufficient documentation

## 2012-05-04 MED ORDER — VITAMIN B-12 1000 MCG PO TABS: 1000.0000 ug | ORAL_TABLET | Freq: Every day | ORAL | Status: DC

## 2012-05-04 NOTE — Assessment & Plan Note (Signed)
R>L in distribution of carpal tunnel. This is most likely etiology since it is somewhat bilateral and no trauma. Recommend conservative management with splinting at night and as tolerated during daytime. Avoid exacerbating activities such as pool weights. May try OTC NSAIDS and perhaps a vitamin B12 supplement since she is on metformin long term. Advised to f/u with PCP in 2-4 weeks or sooner if needed.

## 2012-05-04 NOTE — Patient Instructions (Addendum)
Nice to meet you. You may have early carpal tunnel syndrome. Try wearing a splint at night and during day if you can for next 7-10 days. Try to avoid using the hand weights for short period which may exacerbate. Start taking vitamin B12 daily also. You may use motrin or aleve for next 3-5 days only, not long term. Make appointment with Dr. Tye Savoy in 2-3 weeks.   Carpal Tunnel Syndrome The carpal tunnel is a narrow area located on the palm side of your wrist. The tunnel is formed by the wrist bones and ligaments. Nerves, blood vessels, and tendons pass through the carpal tunnel. Repeated wrist motion or certain diseases may cause swelling within the tunnel. This swelling pinches the main nerve in the wrist (median nerve) and causes the painful hand and arm condition called carpal tunnel syndrome. CAUSES   Repeated wrist motions.  Wrist injuries.  Certain diseases like arthritis, diabetes, alcoholism, hyperthyroidism, and kidney failure.  Obesity.  Pregnancy. SYMPTOMS   A "pins and needles" feeling in your fingers or hand.  Tingling or numbness in your fingers or hand.  An aching feeling in your entire arm.  Wrist pain that goes up your arm to your shoulder.  Pain that goes down into your palm or fingers.  A weak feeling in your hands. DIAGNOSIS  Your caregiver will take your history and perform a physical exam. An electromyography test may be needed. This test measures electrical signals sent out by the muscles. The electrical signals are usually slowed by carpal tunnel syndrome. You may also need X-rays. TREATMENT  Carpal tunnel syndrome may clear up by itself. Your caregiver may recommend a wrist splint or medicine such as a nonsteroidal anti-inflammatory medicine. Cortisone injections may help. Sometimes, surgery may be needed to free the pinched nerve.  HOME CARE INSTRUCTIONS   Take all medicine as directed by your caregiver. Only take over-the-counter or prescription  medicines for pain, discomfort, or fever as directed by your caregiver.  If you were given a splint to keep your wrist from bending, wear it as directed. It is important to wear the splint at night. Wear the splint for as long as you have pain or numbness in your hand, arm, or wrist. This may take 1 to 2 months.  Rest your wrist from any activity that may be causing your pain. If your symptoms are work-related, you may need to talk to your employer about changing to a job that does not require using your wrist.  Put ice on your wrist after long periods of wrist activity.  Put ice in a plastic bag.  Place a towel between your skin and the bag.  Leave the ice on for 15 to 20 minutes, 3 to 4 times a day.  Keep all follow-up visits as directed by your caregiver. This includes any orthopedic referrals, physical therapy, and rehabilitation. Any delay in getting necessary care could result in a delay or failure of your condition to heal. SEEK IMMEDIATE MEDICAL CARE IF:   You have new, unexplained symptoms.  Your symptoms get worse and are not helped or controlled with medicines. MAKE SURE YOU:   Understand these instructions.  Will watch your condition.  Will get help right away if you are not doing well or get worse. Document Released: 05/17/2000 Document Revised: 08/12/2011 Document Reviewed: 04/05/2011 [facility] Patient Information 2013 [place], [place].

## 2012-05-04 NOTE — Progress Notes (Signed)
  Subjective:    Patient ID: Fransisca Kaufmann, female    DOB: [birth year], 61 y.o.   MRN: [id number]  HPI  1. Right hand numbness. For past one week patient notices some right distal hand numbness and throbbing, mostly at right 3rd digit. Worse at night, sometimes dangles the hand off bed to help. She has also noticed to a lesser degree on the left hand also. Denies trauma or new activities, but she is doing pool work outs while [employer] in both hands. Has been losing weight purposely for past few months with diet and exercise.   Taking metformin for diabetes since last year.   Review of Systems Denies swelling, redness, elbow or shoulder pain, dropping objects, fever, chills, myalgias.     Objective:   Physical Exam  Vitals reviewed. Constitutional: She is oriented to person, place, and time. She appears well-developed and well-nourished. No distress.  HENT:  Head: Normocephalic and atraumatic.  Mouth/Throat: Oropharynx is clear and moist.  Eyes: EOM are normal. Pupils are equal, round, and reactive to light.  Neck: Normal range of motion.       No neck pain or shoulder pain.  Cardiovascular: Normal rate, regular rhythm and normal heart sounds.   Pulmonary/Chest: Effort normal.  Musculoskeletal: Normal range of motion. She exhibits no edema and no tenderness.       Normal ROM in shoulder, elbow, wrist. No TTP, edema or erythema.  Strength intact with wrist flexion, extension, interosseous, grip, thumb opposition. Light touch sensation intact throughout. No muscle wasting.  Negative phalen and tinel sign. 2+ B radial pulses intact.  Neurological: She is alert and oriented to person, place, and time.  Skin: No rash noted. She is not diaphoretic.  Psychiatric: She has a normal mood and affect.       Assessment & Plan:

## 2012-06-08 ENCOUNTER — Ambulatory Visit: Payer: Medicare Other | Admitting: Family Medicine

## 2012-06-09 ENCOUNTER — Telehealth: Payer: Self-pay | Admitting: Family Medicine

## 2012-06-09 ENCOUNTER — Ambulatory Visit (INDEPENDENT_AMBULATORY_CARE_PROVIDER_SITE_OTHER): Payer: Medicare Other | Admitting: Family Medicine

## 2012-06-09 ENCOUNTER — Encounter: Payer: Self-pay | Admitting: Family Medicine

## 2012-06-09 VITALS — BP 126/72 | HR 84 | Temp 98.7°F | Ht 63.0 in | Wt 278.2 lb

## 2012-06-09 DIAGNOSIS — H9201 Otalgia, right ear: Secondary | ICD-10-CM

## 2012-06-09 DIAGNOSIS — H9209 Otalgia, unspecified ear: Secondary | ICD-10-CM

## 2012-06-09 MED ORDER — CIPROFLOXACIN-DEXAMETHASONE 0.3-0.1 % OT SUSP: 4.0000 [drp] | Freq: Two times a day (BID) | OTIC | Status: DC

## 2012-06-09 NOTE — Telephone Encounter (Signed)
Spoke with Dr. Tye Savoy and she will send in RX for just antibiotic drops tomorrow AM that hopefully will be cheaper. .  Patient notified.

## 2012-06-09 NOTE — Telephone Encounter (Signed)
Called pharmacy and insurance .  The Ciprodex is a non preferred medication.  Co pay is $80.00. Spoke with [employer] and was told there are no other meds on the formulary that will take place of this medication.   Will forward to Dr. Tye Savoy.

## 2012-06-09 NOTE — Assessment & Plan Note (Signed)
RT ear pain may be secondary to mild otitis externa due to recent hx of pool exercises.  However, outer ear was not particularly painful on exam. - Will treat with Cipro-Dex drops x 7 to 10 days - If no improvement by then, patient to return to clinic  - May consider ENT if ear pain and tinnitus is persistent - Follow up in 4 weeks - needs DM follow up

## 2012-06-09 NOTE — Patient Instructions (Addendum)
It was good to see you Chanta. For ear pain, try using Cipro-Dex drops four times per day x 7 days. If no improvement in 7-10 days, please schedule follow up appointment with me. Happy New Year.  Otitis Externa Otitis externa is a bacterial or fungal infection of the outer ear canal. This is the area from the eardrum to the outside of the ear. Otitis externa is sometimes called "swimmer's ear." CAUSES  Possible causes of infection include:  Swimming in dirty water.  Moisture remaining in the ear after swimming or bathing.  Mild injury (trauma) to the ear.  Objects stuck in the ear (foreign body).  Cuts or scrapes (abrasions) on the outside of the ear. SYMPTOMS  The first symptom of infection is often itching in the ear canal. Later signs and symptoms may include swelling and redness of the ear canal, ear pain, and yellowish-white fluid (pus) coming from the ear. The ear pain may be worse when pulling on the earlobe. DIAGNOSIS  Your caregiver will perform a physical exam. A sample of fluid may be taken from the ear and examined for bacteria or fungi. TREATMENT  Antibiotic ear drops are often given for 10 to 14 days. Treatment may also include pain medicine or corticosteroids to reduce itching and swelling. PREVENTION   Keep your ear dry. Use the corner of a towel to absorb water out of the ear canal after swimming or bathing.  Avoid scratching or putting objects inside your ear. This can damage the ear canal or remove the protective wax that lines the canal. This makes it easier for bacteria and fungi to grow.  Avoid swimming in lakes, polluted water, or poorly chlorinated pools.  You may use ear drops made of rubbing alcohol and vinegar after swimming. Combine equal parts of white vinegar and alcohol in a bottle. Put 3 or 4 drops into each ear after swimming. HOME CARE INSTRUCTIONS   Apply antibiotic ear drops to the ear canal as prescribed by your caregiver.  Only take  over-the-counter or prescription medicines for pain, discomfort, or fever as directed by your caregiver.  If you have diabetes, follow any additional treatment instructions from your caregiver.  Keep all follow-up appointments as directed by your caregiver. SEEK MEDICAL CARE IF:   You have a fever.  Your ear is still red, swollen, painful, or draining pus after 3 days.  Your redness, swelling, or pain gets worse.  You have a severe headache.  You have redness, swelling, pain, or tenderness in the area behind your ear. MAKE SURE YOU:   Understand these instructions.  Will watch your condition.  Will get help right away if you are not doing well or get worse. Document Released: 05/20/2005 Document Revised: 08/12/2011 Document Reviewed: 06/06/2011 [facility] Patient Information 2013 [place], [place].

## 2012-06-09 NOTE — Telephone Encounter (Signed)
States that the ear drops are too expensive and needs something cheaper  [facility] [address]

## 2012-06-09 NOTE — Progress Notes (Signed)
  Subjective:    Patient ID: [name], female    DOB: November 11, 1950, 62 y.o.   MRN: [id number]  HPI  Right ear pain:  Onset 3 days ago, pain has been getting better. Describes it as sharp pain shooting through RT ear, comes and goes.  Normal hearing, no ear fullness, but does c/o ringing of RT ear intermittently (sounds like a "buzzing"). Also complains of feeling lightheaded ith sitting to standing, but no vertigo or difficulty with balance. Associated with fever blisters on chin, but denies any nausea/vomiting or fevers. Denies any runny nose, cough, or congestion or recent URI.  Of note, due for DM check up but patient will schedule at a later date.  Review of Systems  Per HPI    Objective:   Physical Exam  Constitutional: She appears well-nourished. No distress.  HENT:  Head: Normocephalic and atraumatic.  Left Ear: External ear normal.  Nose: Nose normal.  Mouth/Throat: Oropharynx is clear and moist.       RT ear: mild pain with palpation and tugging of pinna; no obvious redness or swelling externally; No middle ear effusion, bulging, or redness of TM  Eyes: Conjunctivae normal are normal.  Neck: Normal range of motion. Neck supple.      Assessment & Plan:

## 2012-06-10 MED ORDER — NEOMYCIN-POLYMYXIN-HC 3.5-10000-1 OT SUSP: 3.0000 [drp] | Freq: Four times a day (QID) | OTIC | Status: DC

## 2012-06-10 NOTE — Telephone Encounter (Signed)
New rx sent to pharmacy.  Please let patient know.  Thanks.

## 2012-06-11 NOTE — Telephone Encounter (Signed)
Patient notified

## 2012-06-22 ENCOUNTER — Other Ambulatory Visit: Payer: Self-pay | Admitting: Family Medicine

## 2012-06-22 NOTE — Telephone Encounter (Signed)
[facility] on [address] has asked the patient to call for refill on her test strips for Prodigy testing monitor.

## 2012-06-23 MED ORDER — PRODIGY BLOOD GLUCOSE MONITOR DEVI: 1.0000 | Status: DC

## 2012-06-23 NOTE — Telephone Encounter (Signed)
Prodigy monitor sent to [facility].

## 2012-06-24 ENCOUNTER — Other Ambulatory Visit: Payer: Medicare Other

## 2012-06-25 ENCOUNTER — Other Ambulatory Visit: Payer: Self-pay | Admitting: *Deleted

## 2012-06-25 MED ORDER — GLUCOSE BLOOD VI STRP: ORAL_STRIP | Status: DC

## 2012-06-26 ENCOUNTER — Other Ambulatory Visit (INDEPENDENT_AMBULATORY_CARE_PROVIDER_SITE_OTHER): Payer: Medicare Other

## 2012-06-26 DIAGNOSIS — E119 Type 2 diabetes mellitus without complications: Secondary | ICD-10-CM

## 2012-06-26 LAB — POCT GLYCOSYLATED HEMOGLOBIN (HGB A1C): Hemoglobin A1C: 6.1

## 2012-06-26 NOTE — Progress Notes (Signed)
A1c 6.1

## 2012-07-27 ENCOUNTER — Other Ambulatory Visit: Payer: Self-pay | Admitting: Family Medicine

## 2012-08-23 ENCOUNTER — Other Ambulatory Visit: Payer: Self-pay | Admitting: Family Medicine

## 2012-08-27 ENCOUNTER — Other Ambulatory Visit: Payer: Self-pay | Admitting: Family Medicine

## 2012-08-27 DIAGNOSIS — Z1231 Encounter for screening mammogram for malignant neoplasm of breast: Secondary | ICD-10-CM

## 2012-09-24 ENCOUNTER — Other Ambulatory Visit: Payer: Self-pay | Admitting: Family Medicine

## 2012-09-30 ENCOUNTER — Ambulatory Visit (HOSPITAL_COMMUNITY)
Admission: RE | Admit: 2012-09-30 | Discharge: 2012-09-30 | Disposition: A | Discharge status: Home / Self Care | Payer: Medicare Other | Source: Ambulatory Visit | Attending: Family Medicine | Admitting: Family Medicine

## 2012-09-30 DIAGNOSIS — Z1231 Encounter for screening mammogram for malignant neoplasm of breast: Secondary | ICD-10-CM

## 2012-10-15 ENCOUNTER — Other Ambulatory Visit: Payer: Self-pay | Admitting: Orthopedic Surgery

## 2012-10-15 ENCOUNTER — Ambulatory Visit
Admission: RE | Admit: 2012-10-15 | Discharge: 2012-10-15 | Disposition: A | Discharge status: Home / Self Care | Payer: Medicare Other | Source: Ambulatory Visit | Attending: Orthopedic Surgery | Admitting: Orthopedic Surgery

## 2012-10-15 DIAGNOSIS — R52 Pain, unspecified: Secondary | ICD-10-CM

## 2012-10-27 ENCOUNTER — Other Ambulatory Visit: Payer: Self-pay | Admitting: Family Medicine

## 2012-11-26 ENCOUNTER — Other Ambulatory Visit: Payer: Self-pay | Admitting: Family Medicine

## 2012-11-27 ENCOUNTER — Other Ambulatory Visit: Payer: Self-pay | Admitting: *Deleted

## 2012-11-27 MED ORDER — LOVASTATIN 20 MG PO TABS: ORAL_TABLET | ORAL | Status: DC

## 2012-12-24 ENCOUNTER — Telehealth: Payer: Self-pay | Admitting: Family Medicine

## 2012-12-24 NOTE — Telephone Encounter (Signed)
Diabetic Testing Supplies Form from [facility] completed and faxed to [phone].  [name]

## 2012-12-24 NOTE — Telephone Encounter (Signed)
Patient dropped off papers to be filled out for diabetic supplies.  Please fax when completed.

## 2012-12-29 ENCOUNTER — Other Ambulatory Visit: Payer: Self-pay | Admitting: Family Medicine

## 2013-01-11 ENCOUNTER — Telehealth: Payer: Self-pay | Admitting: Family Medicine

## 2013-01-11 NOTE — Telephone Encounter (Signed)
Received "pain management compound RX form" for patient. This medication is not listed as every being prescribed for her. In speaking with the patient she reports this was a Designer, multimedia or maybe insurance rep? That was calling to get her to try this medication. She does not desire to try this medication.

## 2013-01-22 ENCOUNTER — Telehealth: Payer: Self-pay | Admitting: *Deleted

## 2013-01-22 NOTE — Telephone Encounter (Signed)
Patient is transferring to [facility].  Pharmacy states she will need new written Rx for Prodigy test strips with specific directions, Dx, and signed by MD since patient has Medicare.  Rx can be faxed to [phone].  Will route request to Dr. Claiborne Billings.  Gaylene Brooks, RN

## 2013-01-25 ENCOUNTER — Other Ambulatory Visit: Payer: Self-pay | Admitting: *Deleted

## 2013-01-25 DIAGNOSIS — E119 Type 2 diabetes mellitus without complications: Secondary | ICD-10-CM

## 2013-01-25 MED ORDER — GLUCOSE BLOOD VI STRP: ORAL_STRIP | Status: DC

## 2013-01-28 ENCOUNTER — Other Ambulatory Visit: Payer: Self-pay | Admitting: Family Medicine

## 2013-01-28 MED ORDER — LOVASTATIN 20 MG PO TABS: 20.0000 mg | ORAL_TABLET | Freq: Every day | ORAL | Status: DC

## 2013-01-28 MED ORDER — METFORMIN HCL 500 MG PO TABS: 500.0000 mg | ORAL_TABLET | Freq: Two times a day (BID) | ORAL | Status: AC

## 2013-01-28 NOTE — Telephone Encounter (Signed)
Pt is requesting refills be sent to new pharmacy [facility]  [address]. She needs refills on metformin and lovastatin. JW

## 2013-02-02 ENCOUNTER — Other Ambulatory Visit: Payer: Self-pay | Admitting: *Deleted

## 2013-02-02 NOTE — Telephone Encounter (Signed)
Please send hand written script via fax with dx code for meter, testing supplies and exact number of times testing per day ( Prodigy auto machine)

## 2013-02-03 ENCOUNTER — Telehealth: Payer: Self-pay | Admitting: Family Medicine

## 2013-02-03 NOTE — Telephone Encounter (Signed)
Pt is calling to check the status of her test strips. She really needs these and would like to know how much longer it was going to be. JW

## 2013-02-04 MED ORDER — LOVASTATIN 20 MG PO TABS: 20.0000 mg | ORAL_TABLET | Freq: Every day | ORAL | Status: DC

## 2013-02-08 ENCOUNTER — Telehealth: Payer: Self-pay | Admitting: Family Medicine

## 2013-02-08 NOTE — Telephone Encounter (Signed)
Pt is still waiting on her test strips and wanted to know how much longer it will be. JW

## 2013-02-09 ENCOUNTER — Other Ambulatory Visit: Payer: Self-pay | Admitting: Family Medicine

## 2013-02-09 DIAGNOSIS — E119 Type 2 diabetes mellitus without complications: Secondary | ICD-10-CM

## 2013-02-09 MED ORDER — GLUCOSE BLOOD VI STRP: ORAL_STRIP | Status: DC

## 2013-02-09 NOTE — Telephone Encounter (Signed)
If approved would you like me to call it in or faxed.thank you. Neaveh Belanger, Virgel Bouquet

## 2013-02-09 NOTE — Telephone Encounter (Addendum)
I have again, sent this strips via e-scribe to her pharmacy with dx code on script. If she does not again receive them and calls back, then please make certain we have correct pharmacy listed, if not change it. Thanks

## 2013-02-09 NOTE — Telephone Encounter (Signed)
Dr Claiborne Billings can you please try again via computer sending rx,they don't except call in with this there needing dx code attached.thank you. Traeger Sultana, Virgel Bouquet

## 2013-02-09 NOTE — Telephone Encounter (Signed)
You can call them in... although they were e prescribed on 8/25. Pleae call them in, thanks.

## 2013-03-25 IMAGING — CR DG KNEE COMPLETE 4+V*R*
4 series · 4 of 4 positions shown · non-contrast
Comparison: Plain films 11/02/2010.

CLINICAL DATA: Revision of total knee arthroplasty.

RIGHT KNEE - COMPLETE 4+ VIEW

[t knee ap right (1 of 2)]
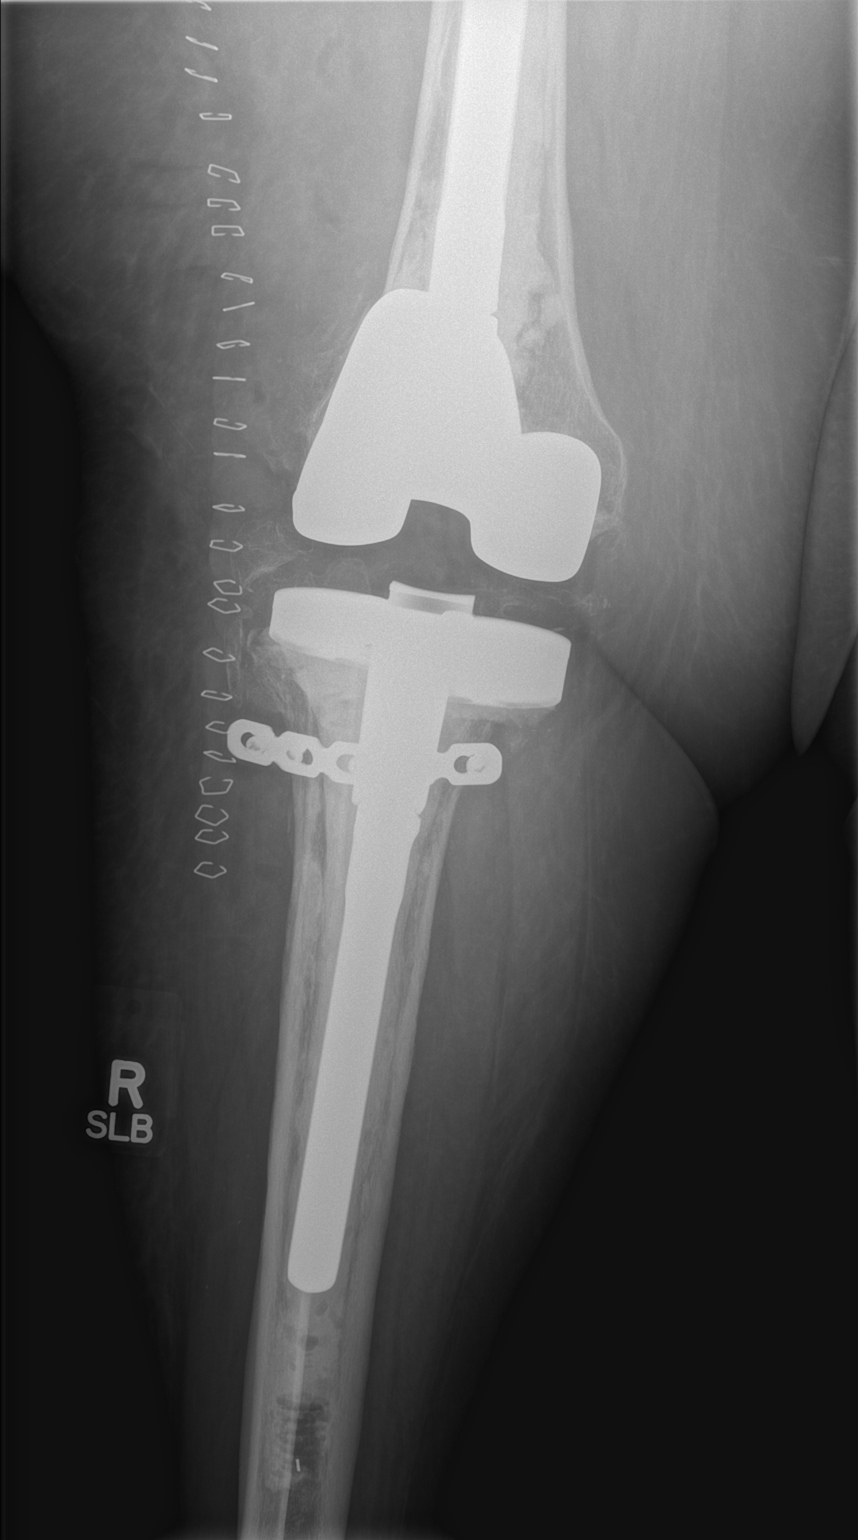

[t knee ap right (2 of 2)]
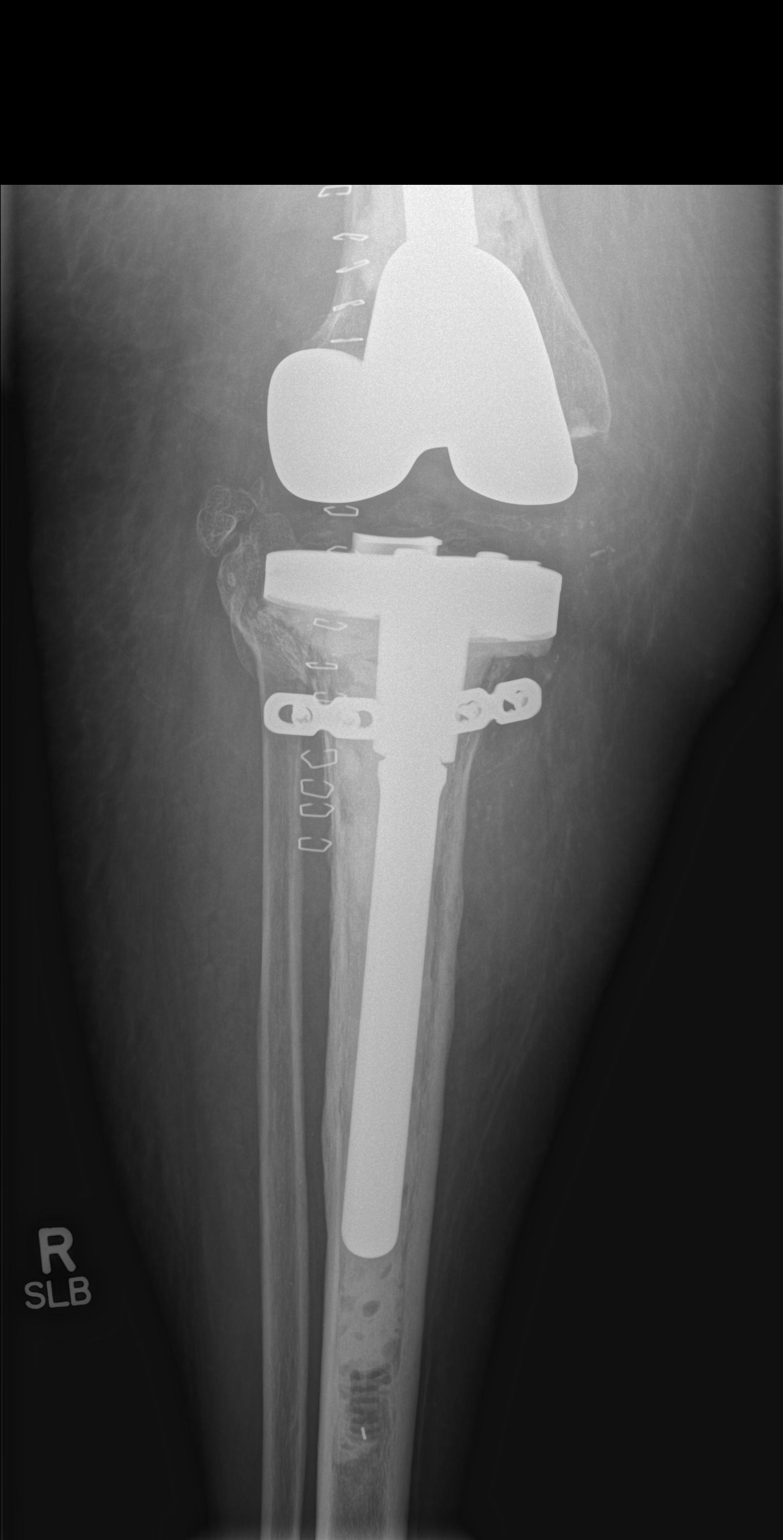

[t knee lat right (1 of 2)]
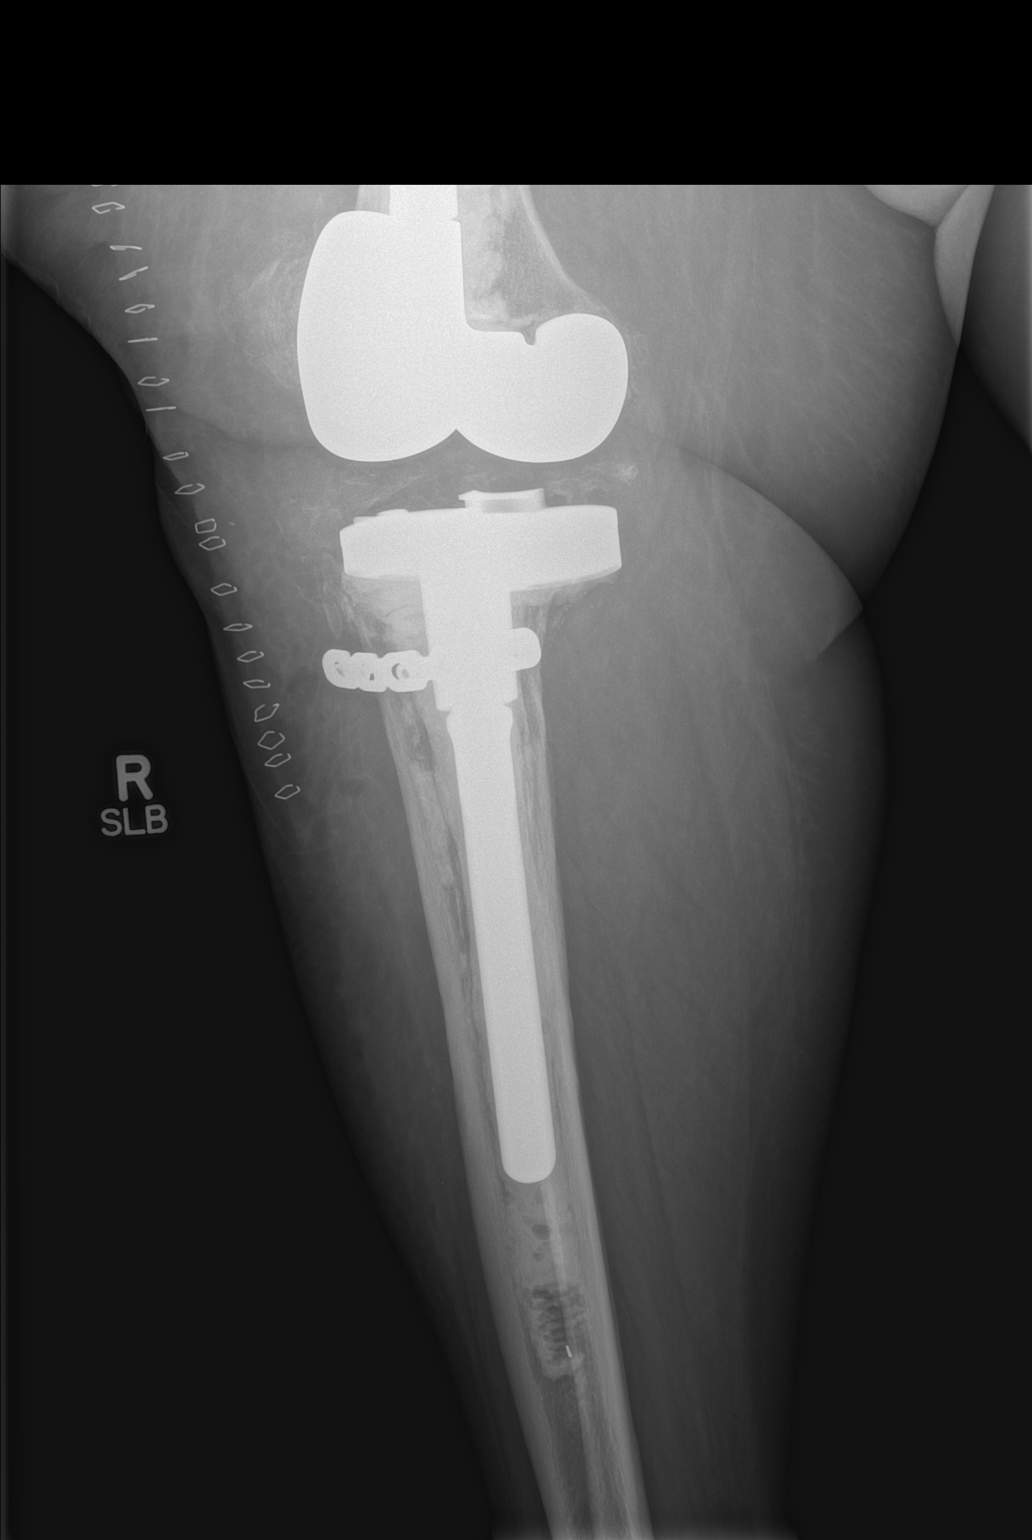

[t knee lat right (2 of 2)]
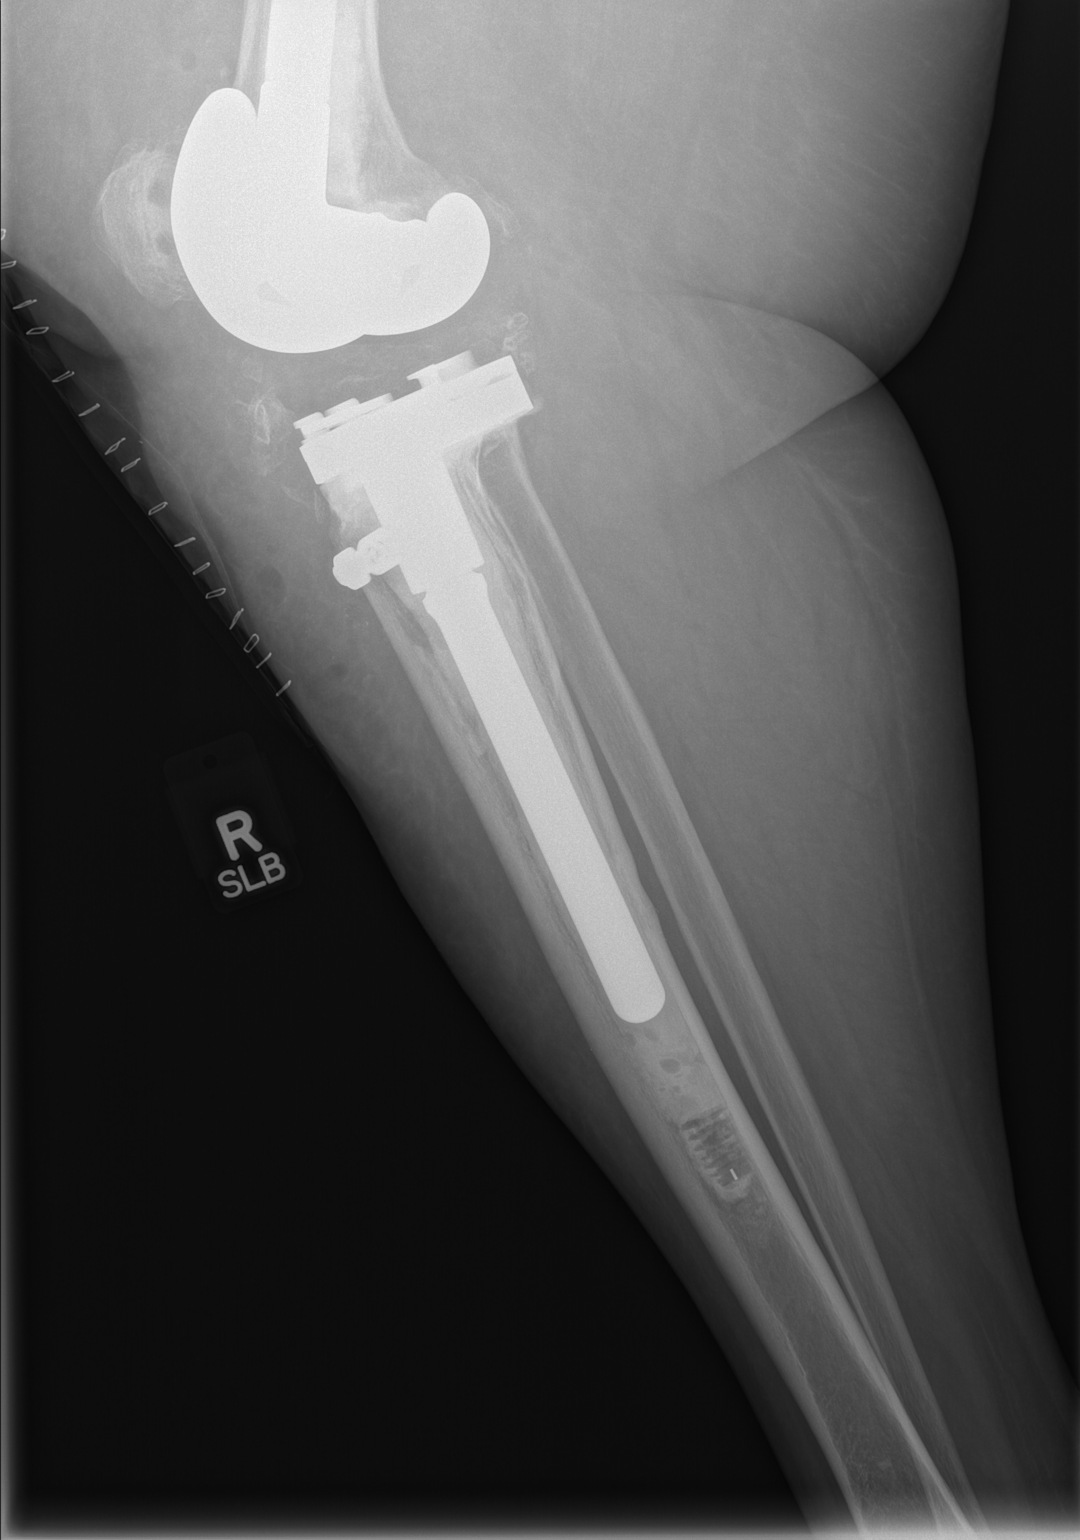

[4 of 4 positions shown; findings below may reference images not displayed]

FINDINGS: Right total knee arthroplasty is again seen.  The patient
has new anterior plate and screws across the proximal tibia.
Lucency at the bone cement interface of the tibial and femoral
components again noted.
IMPRESSION: Interval placement of anterior plate and screws across the proximal
tibia.  No acute finding or other change.

## 2013-04-09 ENCOUNTER — Telehealth: Payer: Self-pay

## 2013-04-09 NOTE — Telephone Encounter (Signed)
Refill request for Lancets. Patient has appt schedule for 11/12.

## 2013-04-14 ENCOUNTER — Encounter: Payer: Medicare Other | Admitting: Family Medicine

## 2013-04-26 IMAGING — CR DG KNEE 1-2V*R*
2 series · 2 of 2 positions shown · non-contrast
Comparison: 12/24/2010.

CLINICAL DATA: Follow-up revision of right total knee arthroplasty
4 weeks ago.

RIGHT KNEE - 1-2 VIEW

[view not recorded (1 of 2)]
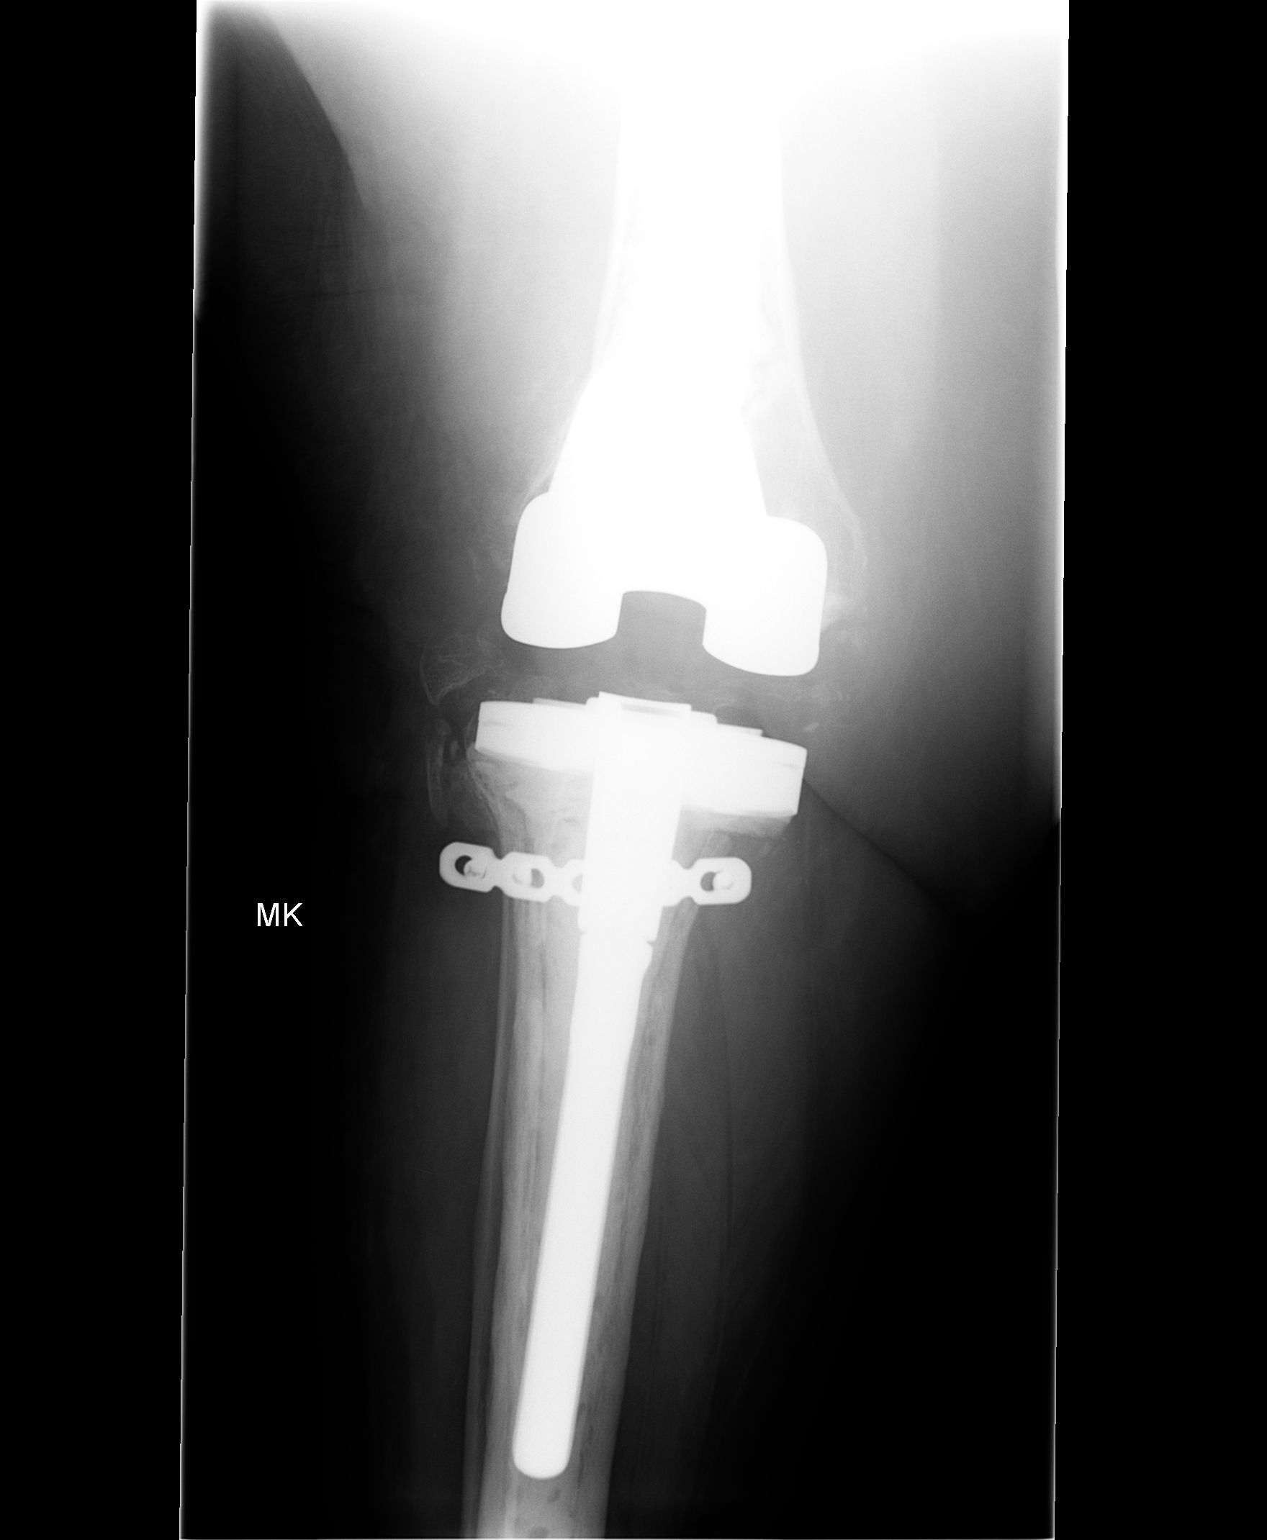

[view not recorded (2 of 2)]
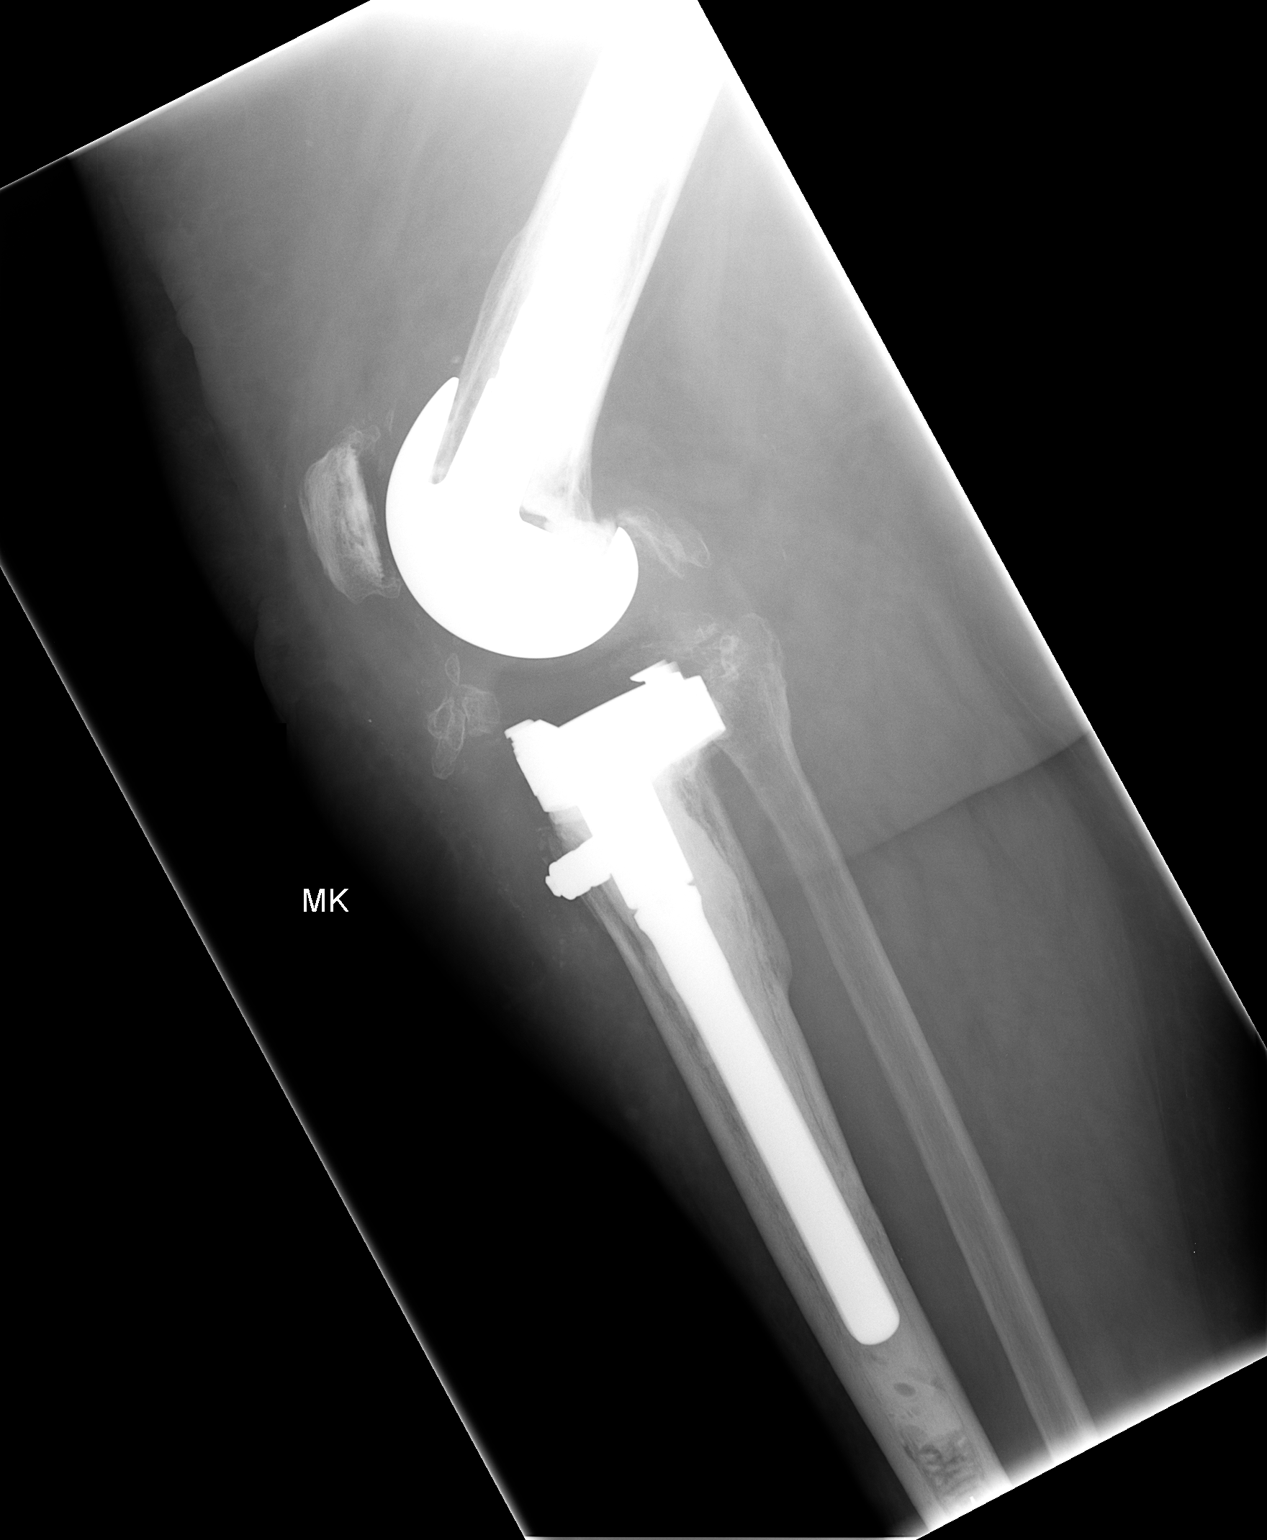

[2 of 2 positions shown; findings below may reference images not displayed]

FINDINGS: Visualized portions of the right total knee arthroplasty
are unchanged.  The proximal aspect of the femoral prosthesis is
not imaged.  Lucency at the bone cement interface is unchanged.
The anterior plate and screws within the proximal tibia are
unchanged.  Postoperative soft tissue swelling and emphysema have
resolved.  There is no evidence of acute fracture or dislocation.
Ossific densities projecting over the infrapatellar fat on the
lateral view are unchanged, likely extraarticular.
IMPRESSION: Stable appearance status post revision of right total knee
arthroplasty.  Postoperative soft tissue swelling/emphysema have
resolved.

## 2013-05-06 ENCOUNTER — Ambulatory Visit
Admission: RE | Admit: 2013-05-06 | Discharge: 2013-05-06 | Disposition: A | Discharge status: Home / Self Care | Payer: Medicare Other | Source: Ambulatory Visit | Attending: Orthopedic Surgery | Admitting: Orthopedic Surgery

## 2013-05-06 ENCOUNTER — Other Ambulatory Visit: Payer: Self-pay | Admitting: Orthopedic Surgery

## 2013-05-06 DIAGNOSIS — M79661 Pain in right lower leg: Secondary | ICD-10-CM

## 2013-05-06 DIAGNOSIS — M25561 Pain in right knee: Secondary | ICD-10-CM

## 2013-08-11 ENCOUNTER — Telehealth: Payer: Self-pay | Admitting: Family Medicine

## 2013-08-11 ENCOUNTER — Other Ambulatory Visit: Payer: Self-pay | Admitting: Family Medicine

## 2013-08-11 MED ORDER — LOVASTATIN 20 MG PO TABS: 20.0000 mg | ORAL_TABLET | Freq: Every day | ORAL | Status: DC

## 2013-08-11 NOTE — Telephone Encounter (Signed)
Patient no longer seen her,her new PCP is in [place].[name], [name] BouquetGiovanna [name]

## 2013-08-11 NOTE — Telephone Encounter (Signed)
Patient has not been seen in over a year, and I have never met her. Please have her make an appointment with me for her yearly. i refilled her lovastatin request for 1 month. Thanks.

## 2013-08-23 ENCOUNTER — Other Ambulatory Visit (HOSPITAL_COMMUNITY): Payer: Self-pay | Admitting: Family Medicine

## 2013-08-23 DIAGNOSIS — Z1231 Encounter for screening mammogram for malignant neoplasm of breast: Secondary | ICD-10-CM

## 2013-10-04 ENCOUNTER — Ambulatory Visit (HOSPITAL_COMMUNITY)
Admission: RE | Admit: 2013-10-04 | Discharge: 2013-10-04 | Disposition: A | Discharge status: Home / Self Care | Payer: Medicare HMO | Source: Ambulatory Visit | Attending: Family Medicine | Admitting: Family Medicine

## 2013-10-04 DIAGNOSIS — Z1231 Encounter for screening mammogram for malignant neoplasm of breast: Secondary | ICD-10-CM | POA: Insufficient documentation

## 2013-10-04 LAB — HM MAMMOGRAPHY: HM MAMMO: NORMAL

## 2014-01-27 ENCOUNTER — Ambulatory Visit: Payer: Self-pay | Admitting: Family Medicine

## 2014-01-27 LAB — HM DEXA SCAN

## 2014-03-30 LAB — CBC AND DIFFERENTIAL
HEMATOCRIT: 42 % (ref 36–46)
Hemoglobin: 13.8 g/dL (ref 12.0–16.0)
Neutrophils Absolute: 56 /uL
Platelets: 257 10*3/uL (ref 150–399)
WBC: 6.6 10*3/mL

## 2014-03-30 LAB — BASIC METABOLIC PANEL
BUN: 21 mg/dL (ref 4–21)
CREATININE: 0.9 mg/dL (ref <=1.1)
Glucose: 96 mg/dL
POTASSIUM: 4.2 mmol/L (ref 3.4–5.3)
Sodium: 141 mmol/L (ref 137–147)

## 2014-06-29 DIAGNOSIS — M25561 Pain in right knee: Secondary | ICD-10-CM | POA: Diagnosis not present

## 2014-08-08 DIAGNOSIS — Z23 Encounter for immunization: Secondary | ICD-10-CM | POA: Diagnosis not present

## 2014-08-08 DIAGNOSIS — E669 Obesity, unspecified: Secondary | ICD-10-CM | POA: Diagnosis not present

## 2014-08-08 DIAGNOSIS — E1122 Type 2 diabetes mellitus with diabetic chronic kidney disease: Secondary | ICD-10-CM | POA: Diagnosis not present

## 2014-08-08 DIAGNOSIS — E784 Other hyperlipidemia: Secondary | ICD-10-CM | POA: Diagnosis not present

## 2014-08-08 DIAGNOSIS — E119 Type 2 diabetes mellitus without complications: Secondary | ICD-10-CM | POA: Diagnosis not present

## 2014-08-08 LAB — LIPID PANEL
CHOLESTEROL: 168 mg/dL (ref 0–200)
HDL: 64 mg/dL (ref 35–70)
LDL CALC: 88 mg/dL
LDl/HDL Ratio: 1.4
Triglycerides: 82 mg/dL (ref 40–160)

## 2014-08-08 LAB — HEPATIC FUNCTION PANEL
ALK PHOS: 129 U/L — AB (ref 25–125)
ALT: 15 U/L (ref 7–35)
AST: 17 U/L (ref 13–35)
BILIRUBIN, TOTAL: 0.3 mg/dL

## 2014-08-08 LAB — HEMOGLOBIN A1C: Hgb A1c MFr Bld: 6.6 % — AB (ref 4.0–6.0)

## 2014-10-11 DIAGNOSIS — E119 Type 2 diabetes mellitus without complications: Secondary | ICD-10-CM | POA: Insufficient documentation

## 2014-10-11 DIAGNOSIS — Z8614 Personal history of Methicillin resistant Staphylococcus aureus infection: Secondary | ICD-10-CM | POA: Insufficient documentation

## 2014-10-11 DIAGNOSIS — E669 Obesity, unspecified: Secondary | ICD-10-CM | POA: Insufficient documentation

## 2014-10-11 DIAGNOSIS — I10 Essential (primary) hypertension: Secondary | ICD-10-CM | POA: Insufficient documentation

## 2014-10-11 DIAGNOSIS — E785 Hyperlipidemia, unspecified: Secondary | ICD-10-CM | POA: Insufficient documentation

## 2014-10-11 DIAGNOSIS — E668 Other obesity: Secondary | ICD-10-CM | POA: Insufficient documentation

## 2014-10-11 DIAGNOSIS — N182 Chronic kidney disease, stage 2 (mild): Secondary | ICD-10-CM | POA: Insufficient documentation

## 2014-10-11 DIAGNOSIS — R51 Headache: Secondary | ICD-10-CM

## 2014-10-11 DIAGNOSIS — E1122 Type 2 diabetes mellitus with diabetic chronic kidney disease: Secondary | ICD-10-CM | POA: Insufficient documentation

## 2014-10-11 DIAGNOSIS — M1990 Unspecified osteoarthritis, unspecified site: Secondary | ICD-10-CM | POA: Insufficient documentation

## 2014-10-11 DIAGNOSIS — R519 Headache, unspecified: Secondary | ICD-10-CM | POA: Insufficient documentation

## 2014-10-11 DIAGNOSIS — A539 Syphilis, unspecified: Secondary | ICD-10-CM | POA: Insufficient documentation

## 2014-11-14 DIAGNOSIS — M25561 Pain in right knee: Secondary | ICD-10-CM | POA: Diagnosis not present

## 2014-11-14 DIAGNOSIS — E119 Type 2 diabetes mellitus without complications: Secondary | ICD-10-CM | POA: Diagnosis not present

## 2014-11-14 DIAGNOSIS — E785 Hyperlipidemia, unspecified: Secondary | ICD-10-CM | POA: Diagnosis not present

## 2014-11-18 ENCOUNTER — Other Ambulatory Visit (HOSPITAL_COMMUNITY): Payer: Self-pay | Admitting: Family Medicine

## 2014-11-18 DIAGNOSIS — Z1231 Encounter for screening mammogram for malignant neoplasm of breast: Secondary | ICD-10-CM

## 2014-12-08 ENCOUNTER — Ambulatory Visit (HOSPITAL_COMMUNITY)
Admission: RE | Admit: 2014-12-08 | Discharge: 2014-12-08 | Disposition: A | Discharge status: Home / Self Care | Payer: Commercial Managed Care - HMO | Source: Ambulatory Visit | Attending: Family Medicine | Admitting: Family Medicine

## 2014-12-08 DIAGNOSIS — Z1231 Encounter for screening mammogram for malignant neoplasm of breast: Secondary | ICD-10-CM | POA: Diagnosis not present

## 2014-12-12 ENCOUNTER — Ambulatory Visit: Payer: Self-pay | Admitting: Family Medicine

## 2015-03-30 DIAGNOSIS — Z96651 Presence of right artificial knee joint: Secondary | ICD-10-CM | POA: Diagnosis not present

## 2015-03-30 DIAGNOSIS — T8484XA Pain due to internal orthopedic prosthetic devices, implants and grafts, initial encounter: Secondary | ICD-10-CM | POA: Diagnosis not present

## 2015-04-07 ENCOUNTER — Other Ambulatory Visit: Payer: Self-pay | Admitting: Family Medicine

## 2015-05-16 DIAGNOSIS — Z0000 Encounter for general adult medical examination without abnormal findings: Secondary | ICD-10-CM | POA: Diagnosis not present

## 2015-05-16 DIAGNOSIS — E785 Hyperlipidemia, unspecified: Secondary | ICD-10-CM | POA: Diagnosis not present

## 2015-05-16 DIAGNOSIS — E119 Type 2 diabetes mellitus without complications: Secondary | ICD-10-CM | POA: Diagnosis not present

## 2015-05-16 DIAGNOSIS — Z23 Encounter for immunization: Secondary | ICD-10-CM | POA: Diagnosis not present

## 2015-09-04 DIAGNOSIS — M79644 Pain in right finger(s): Secondary | ICD-10-CM | POA: Diagnosis not present

## 2015-09-04 DIAGNOSIS — G5601 Carpal tunnel syndrome, right upper limb: Secondary | ICD-10-CM | POA: Diagnosis not present

## 2015-09-05 ENCOUNTER — Ambulatory Visit
Admission: RE | Admit: 2015-09-05 | Discharge: 2015-09-05 | Disposition: A | Discharge status: Home / Self Care | Payer: Commercial Managed Care - HMO | Source: Ambulatory Visit | Attending: Physician Assistant | Admitting: Physician Assistant

## 2015-09-05 ENCOUNTER — Other Ambulatory Visit: Payer: Self-pay | Admitting: Physician Assistant

## 2015-09-05 DIAGNOSIS — M19041 Primary osteoarthritis, right hand: Secondary | ICD-10-CM | POA: Diagnosis not present

## 2015-09-05 DIAGNOSIS — M79644 Pain in right finger(s): Secondary | ICD-10-CM

## 2015-11-14 ENCOUNTER — Other Ambulatory Visit: Payer: Self-pay | Admitting: Family Medicine

## 2015-11-14 DIAGNOSIS — Z1231 Encounter for screening mammogram for malignant neoplasm of breast: Secondary | ICD-10-CM

## 2015-12-11 ENCOUNTER — Ambulatory Visit
Admission: RE | Admit: 2015-12-11 | Discharge: 2015-12-11 | Disposition: A | Discharge status: Home / Self Care | Payer: Commercial Managed Care - HMO | Source: Ambulatory Visit | Attending: Family Medicine | Admitting: Family Medicine

## 2015-12-11 DIAGNOSIS — Z1231 Encounter for screening mammogram for malignant neoplasm of breast: Secondary | ICD-10-CM | POA: Diagnosis not present

## 2015-12-20 DIAGNOSIS — R600 Localized edema: Secondary | ICD-10-CM | POA: Diagnosis not present

## 2015-12-20 DIAGNOSIS — E119 Type 2 diabetes mellitus without complications: Secondary | ICD-10-CM | POA: Diagnosis not present

## 2015-12-20 DIAGNOSIS — E785 Hyperlipidemia, unspecified: Secondary | ICD-10-CM | POA: Diagnosis not present

## 2016-01-01 DIAGNOSIS — S63502A Unspecified sprain of left wrist, initial encounter: Secondary | ICD-10-CM | POA: Diagnosis not present

## 2016-01-01 DIAGNOSIS — W19XXXA Unspecified fall, initial encounter: Secondary | ICD-10-CM | POA: Diagnosis not present

## 2016-03-18 DIAGNOSIS — R7309 Other abnormal glucose: Secondary | ICD-10-CM | POA: Diagnosis not present

## 2016-05-29 DIAGNOSIS — R69 Illness, unspecified: Secondary | ICD-10-CM | POA: Diagnosis not present

## 2016-06-24 DIAGNOSIS — E119 Type 2 diabetes mellitus without complications: Secondary | ICD-10-CM | POA: Diagnosis not present

## 2016-06-24 DIAGNOSIS — E785 Hyperlipidemia, unspecified: Secondary | ICD-10-CM | POA: Diagnosis not present

## 2016-06-24 DIAGNOSIS — M25561 Pain in right knee: Secondary | ICD-10-CM | POA: Diagnosis not present

## 2016-07-04 DIAGNOSIS — E119 Type 2 diabetes mellitus without complications: Secondary | ICD-10-CM | POA: Diagnosis not present

## 2016-07-04 DIAGNOSIS — Z7984 Long term (current) use of oral hypoglycemic drugs: Secondary | ICD-10-CM | POA: Diagnosis not present

## 2016-07-04 DIAGNOSIS — H2513 Age-related nuclear cataract, bilateral: Secondary | ICD-10-CM | POA: Diagnosis not present

## 2016-07-04 DIAGNOSIS — H5203 Hypermetropia, bilateral: Secondary | ICD-10-CM | POA: Diagnosis not present

## 2016-07-04 DIAGNOSIS — H52223 Regular astigmatism, bilateral: Secondary | ICD-10-CM | POA: Diagnosis not present

## 2016-07-12 DIAGNOSIS — R69 Illness, unspecified: Secondary | ICD-10-CM | POA: Diagnosis not present

## 2016-07-17 DIAGNOSIS — R69 Illness, unspecified: Secondary | ICD-10-CM | POA: Diagnosis not present

## 2016-07-31 DIAGNOSIS — M25661 Stiffness of right knee, not elsewhere classified: Secondary | ICD-10-CM | POA: Diagnosis not present

## 2016-07-31 DIAGNOSIS — T8489XA Other specified complication of internal orthopedic prosthetic devices, implants and grafts, initial encounter: Secondary | ICD-10-CM | POA: Diagnosis not present

## 2016-07-31 DIAGNOSIS — Z96651 Presence of right artificial knee joint: Secondary | ICD-10-CM | POA: Diagnosis not present

## 2016-08-20 DIAGNOSIS — R05 Cough: Secondary | ICD-10-CM | POA: Diagnosis not present

## 2016-08-28 DIAGNOSIS — Z0100 Encounter for examination of eyes and vision without abnormal findings: Secondary | ICD-10-CM | POA: Diagnosis not present

## 2016-09-02 DIAGNOSIS — M1711 Unilateral primary osteoarthritis, right knee: Secondary | ICD-10-CM | POA: Diagnosis not present

## 2016-09-02 DIAGNOSIS — M1712 Unilateral primary osteoarthritis, left knee: Secondary | ICD-10-CM | POA: Diagnosis not present

## 2016-09-17 DIAGNOSIS — E119 Type 2 diabetes mellitus without complications: Secondary | ICD-10-CM | POA: Diagnosis not present

## 2016-09-17 DIAGNOSIS — L853 Xerosis cutis: Secondary | ICD-10-CM | POA: Diagnosis not present

## 2016-09-17 DIAGNOSIS — Z7984 Long term (current) use of oral hypoglycemic drugs: Secondary | ICD-10-CM | POA: Diagnosis not present

## 2016-09-17 DIAGNOSIS — J309 Allergic rhinitis, unspecified: Secondary | ICD-10-CM | POA: Diagnosis not present

## 2016-09-17 DIAGNOSIS — R600 Localized edema: Secondary | ICD-10-CM | POA: Diagnosis not present

## 2016-09-17 DIAGNOSIS — Z0000 Encounter for general adult medical examination without abnormal findings: Secondary | ICD-10-CM | POA: Diagnosis not present

## 2016-09-17 DIAGNOSIS — E669 Obesity, unspecified: Secondary | ICD-10-CM | POA: Diagnosis not present

## 2016-09-17 DIAGNOSIS — Z7982 Long term (current) use of aspirin: Secondary | ICD-10-CM | POA: Diagnosis not present

## 2016-09-17 DIAGNOSIS — E7800 Pure hypercholesterolemia, unspecified: Secondary | ICD-10-CM | POA: Diagnosis not present

## 2016-09-27 ENCOUNTER — Ambulatory Visit
Admission: RE | Admit: 2016-09-27 | Discharge: 2016-09-27 | Disposition: A | Discharge status: Home / Self Care | Payer: Commercial Managed Care - HMO | Source: Ambulatory Visit | Attending: Family Medicine | Admitting: Family Medicine

## 2016-09-27 ENCOUNTER — Other Ambulatory Visit: Payer: Self-pay | Admitting: Family Medicine

## 2016-09-27 DIAGNOSIS — R05 Cough: Secondary | ICD-10-CM

## 2016-09-27 DIAGNOSIS — R059 Cough, unspecified: Secondary | ICD-10-CM

## 2016-09-27 DIAGNOSIS — E119 Type 2 diabetes mellitus without complications: Secondary | ICD-10-CM | POA: Diagnosis not present

## 2016-10-16 DIAGNOSIS — R69 Illness, unspecified: Secondary | ICD-10-CM | POA: Diagnosis not present

## 2016-11-13 DIAGNOSIS — R69 Illness, unspecified: Secondary | ICD-10-CM | POA: Diagnosis not present

## 2016-11-18 ENCOUNTER — Other Ambulatory Visit: Payer: Self-pay | Admitting: Family Medicine

## 2016-11-18 DIAGNOSIS — Z1231 Encounter for screening mammogram for malignant neoplasm of breast: Secondary | ICD-10-CM

## 2016-11-29 DIAGNOSIS — T1490XA Injury, unspecified, initial encounter: Secondary | ICD-10-CM | POA: Diagnosis not present

## 2016-11-29 DIAGNOSIS — M791 Myalgia: Secondary | ICD-10-CM | POA: Diagnosis not present

## 2016-11-29 DIAGNOSIS — S335XXA Sprain of ligaments of lumbar spine, initial encounter: Secondary | ICD-10-CM | POA: Diagnosis not present

## 2016-12-13 ENCOUNTER — Ambulatory Visit: Payer: Medicare HMO

## 2016-12-23 DIAGNOSIS — R609 Edema, unspecified: Secondary | ICD-10-CM | POA: Diagnosis not present

## 2016-12-23 DIAGNOSIS — Z6841 Body Mass Index (BMI) 40.0 and over, adult: Secondary | ICD-10-CM | POA: Diagnosis not present

## 2016-12-23 DIAGNOSIS — E785 Hyperlipidemia, unspecified: Secondary | ICD-10-CM | POA: Diagnosis not present

## 2016-12-23 DIAGNOSIS — Z23 Encounter for immunization: Secondary | ICD-10-CM | POA: Diagnosis not present

## 2016-12-23 DIAGNOSIS — E119 Type 2 diabetes mellitus without complications: Secondary | ICD-10-CM | POA: Diagnosis not present

## 2016-12-24 DIAGNOSIS — M545 Low back pain: Secondary | ICD-10-CM | POA: Diagnosis not present

## 2016-12-24 DIAGNOSIS — R269 Unspecified abnormalities of gait and mobility: Secondary | ICD-10-CM | POA: Diagnosis not present

## 2016-12-24 DIAGNOSIS — R293 Abnormal posture: Secondary | ICD-10-CM | POA: Diagnosis not present

## 2016-12-24 DIAGNOSIS — G5702 Lesion of sciatic nerve, left lower limb: Secondary | ICD-10-CM | POA: Diagnosis not present

## 2016-12-27 DIAGNOSIS — R293 Abnormal posture: Secondary | ICD-10-CM | POA: Diagnosis not present

## 2016-12-27 DIAGNOSIS — G5702 Lesion of sciatic nerve, left lower limb: Secondary | ICD-10-CM | POA: Diagnosis not present

## 2016-12-27 DIAGNOSIS — M545 Low back pain: Secondary | ICD-10-CM | POA: Diagnosis not present

## 2016-12-27 DIAGNOSIS — R269 Unspecified abnormalities of gait and mobility: Secondary | ICD-10-CM | POA: Diagnosis not present

## 2017-01-07 ENCOUNTER — Ambulatory Visit: Payer: Medicare HMO

## 2017-01-31 DIAGNOSIS — R69 Illness, unspecified: Secondary | ICD-10-CM | POA: Diagnosis not present

## 2017-02-11 ENCOUNTER — Ambulatory Visit
Admission: RE | Admit: 2017-02-11 | Discharge: 2017-02-11 | Disposition: A | Discharge status: Home / Self Care | Payer: Medicare HMO | Source: Ambulatory Visit | Attending: Family Medicine | Admitting: Family Medicine

## 2017-02-11 DIAGNOSIS — Z1231 Encounter for screening mammogram for malignant neoplasm of breast: Secondary | ICD-10-CM

## 2017-03-20 DIAGNOSIS — R69 Illness, unspecified: Secondary | ICD-10-CM | POA: Diagnosis not present

## 2017-04-18 DIAGNOSIS — R69 Illness, unspecified: Secondary | ICD-10-CM | POA: Diagnosis not present

## 2017-05-17 DIAGNOSIS — S86812A Strain of other muscle(s) and tendon(s) at lower leg level, left leg, initial encounter: Secondary | ICD-10-CM | POA: Diagnosis not present

## 2017-05-17 DIAGNOSIS — S86811A Strain of other muscle(s) and tendon(s) at lower leg level, right leg, initial encounter: Secondary | ICD-10-CM | POA: Diagnosis not present

## 2017-05-17 DIAGNOSIS — M79604 Pain in right leg: Secondary | ICD-10-CM | POA: Diagnosis not present

## 2017-05-17 DIAGNOSIS — M79605 Pain in left leg: Secondary | ICD-10-CM | POA: Diagnosis not present

## 2017-05-19 DIAGNOSIS — M79662 Pain in left lower leg: Secondary | ICD-10-CM | POA: Diagnosis not present

## 2017-05-19 DIAGNOSIS — R600 Localized edema: Secondary | ICD-10-CM | POA: Diagnosis not present

## 2017-05-19 DIAGNOSIS — M79661 Pain in right lower leg: Secondary | ICD-10-CM | POA: Diagnosis not present

## 2017-06-25 DIAGNOSIS — E785 Hyperlipidemia, unspecified: Secondary | ICD-10-CM | POA: Diagnosis not present

## 2017-06-25 DIAGNOSIS — R600 Localized edema: Secondary | ICD-10-CM | POA: Diagnosis not present

## 2017-06-25 DIAGNOSIS — Z6841 Body Mass Index (BMI) 40.0 and over, adult: Secondary | ICD-10-CM | POA: Diagnosis not present

## 2017-06-25 DIAGNOSIS — Z1389 Encounter for screening for other disorder: Secondary | ICD-10-CM | POA: Diagnosis not present

## 2017-06-25 DIAGNOSIS — Z13820 Encounter for screening for osteoporosis: Secondary | ICD-10-CM | POA: Diagnosis not present

## 2017-06-25 DIAGNOSIS — Z0000 Encounter for general adult medical examination without abnormal findings: Secondary | ICD-10-CM | POA: Diagnosis not present

## 2017-06-25 DIAGNOSIS — Z1211 Encounter for screening for malignant neoplasm of colon: Secondary | ICD-10-CM | POA: Diagnosis not present

## 2017-06-25 DIAGNOSIS — E119 Type 2 diabetes mellitus without complications: Secondary | ICD-10-CM | POA: Diagnosis not present

## 2017-06-25 DIAGNOSIS — Z7984 Long term (current) use of oral hypoglycemic drugs: Secondary | ICD-10-CM | POA: Diagnosis not present

## 2017-06-30 DIAGNOSIS — R69 Illness, unspecified: Secondary | ICD-10-CM | POA: Diagnosis not present

## 2017-07-24 DIAGNOSIS — R69 Illness, unspecified: Secondary | ICD-10-CM | POA: Diagnosis not present

## 2017-07-24 DIAGNOSIS — R7989 Other specified abnormal findings of blood chemistry: Secondary | ICD-10-CM | POA: Diagnosis not present

## 2017-08-05 ENCOUNTER — Other Ambulatory Visit: Payer: Self-pay | Admitting: Gastroenterology

## 2017-08-19 DIAGNOSIS — Z780 Asymptomatic menopausal state: Secondary | ICD-10-CM | POA: Diagnosis not present

## 2017-08-19 DIAGNOSIS — M810 Age-related osteoporosis without current pathological fracture: Secondary | ICD-10-CM | POA: Diagnosis not present

## 2017-08-25 ENCOUNTER — Other Ambulatory Visit: Payer: Self-pay

## 2017-08-25 ENCOUNTER — Encounter (HOSPITAL_COMMUNITY): Payer: Self-pay | Admitting: *Deleted

## 2017-08-26 ENCOUNTER — Encounter (HOSPITAL_COMMUNITY)
Admission: RE | Disposition: A | Discharge status: Home / Self Care | Payer: Self-pay | Source: Ambulatory Visit | Attending: Gastroenterology

## 2017-08-26 ENCOUNTER — Ambulatory Visit (HOSPITAL_COMMUNITY)
Admission: RE | Admit: 2017-08-26 | Discharge: 2017-08-26 | Disposition: A | Discharge status: Home / Self Care | Payer: Medicare HMO | Source: Ambulatory Visit | Attending: Gastroenterology | Admitting: Gastroenterology

## 2017-08-26 ENCOUNTER — Encounter (HOSPITAL_COMMUNITY): Payer: Self-pay | Admitting: *Deleted

## 2017-08-26 ENCOUNTER — Ambulatory Visit (HOSPITAL_COMMUNITY): Payer: Medicare HMO | Admitting: Anesthesiology

## 2017-08-26 DIAGNOSIS — R51 Headache: Secondary | ICD-10-CM | POA: Diagnosis not present

## 2017-08-26 DIAGNOSIS — K219 Gastro-esophageal reflux disease without esophagitis: Secondary | ICD-10-CM | POA: Insufficient documentation

## 2017-08-26 DIAGNOSIS — N182 Chronic kidney disease, stage 2 (mild): Secondary | ICD-10-CM | POA: Diagnosis not present

## 2017-08-26 DIAGNOSIS — Z6841 Body Mass Index (BMI) 40.0 and over, adult: Secondary | ICD-10-CM | POA: Insufficient documentation

## 2017-08-26 DIAGNOSIS — E785 Hyperlipidemia, unspecified: Secondary | ICD-10-CM | POA: Diagnosis not present

## 2017-08-26 DIAGNOSIS — E119 Type 2 diabetes mellitus without complications: Secondary | ICD-10-CM | POA: Diagnosis not present

## 2017-08-26 DIAGNOSIS — E1122 Type 2 diabetes mellitus with diabetic chronic kidney disease: Secondary | ICD-10-CM | POA: Diagnosis not present

## 2017-08-26 DIAGNOSIS — Z7984 Long term (current) use of oral hypoglycemic drugs: Secondary | ICD-10-CM | POA: Insufficient documentation

## 2017-08-26 DIAGNOSIS — Z87891 Personal history of nicotine dependence: Secondary | ICD-10-CM | POA: Insufficient documentation

## 2017-08-26 DIAGNOSIS — Z1211 Encounter for screening for malignant neoplasm of colon: Secondary | ICD-10-CM | POA: Diagnosis not present

## 2017-08-26 HISTORY — DX: Hyperlipidemia, unspecified: E78.5

## 2017-08-26 HISTORY — DX: Gastro-esophageal reflux disease without esophagitis: K21.9

## 2017-08-26 HISTORY — DX: Type 2 diabetes mellitus without complications: E11.9

## 2017-08-26 HISTORY — DX: Personal history of other medical treatment: Z92.89

## 2017-08-26 HISTORY — PX: COLONOSCOPY WITH PROPOFOL: SHX5780

## 2017-08-26 LAB — GLUCOSE, CAPILLARY: Glucose-Capillary: 86 mg/dL (ref 65–99)

## 2017-08-26 SURGERY — COLONOSCOPY WITH PROPOFOL
Anesthesia: Monitor Anesthesia Care

## 2017-08-26 MED ORDER — LACTATED RINGERS IV SOLN
INTRAVENOUS | Status: DC
  Administered 2017-08-26: 1000 mL via INTRAVENOUS

## 2017-08-26 MED ORDER — PHENYLEPHRINE HCL 10 MG/ML IJ SOLN
INTRAMUSCULAR | Status: DC | PRN
  Administered 2017-08-26 (×2): 80 ug via INTRAVENOUS

## 2017-08-26 MED ORDER — SODIUM CHLORIDE 0.9 % IV SOLN: INTRAVENOUS | Status: DC

## 2017-08-26 MED ORDER — PROPOFOL 10 MG/ML IV BOLUS
INTRAVENOUS | Status: DC | PRN
  Administered 2017-08-26: 40 mg via INTRAVENOUS

## 2017-08-26 MED ORDER — PROPOFOL 500 MG/50ML IV EMUL
INTRAVENOUS | Status: DC | PRN
  Administered 2017-08-26: 100 ug/kg/min via INTRAVENOUS

## 2017-08-26 MED ORDER — LIDOCAINE HCL (CARDIAC) 20 MG/ML IV SOLN
INTRAVENOUS | Status: DC | PRN
  Administered 2017-08-26: 100 mg via INTRAVENOUS

## 2017-08-26 SURGICAL SUPPLY — 21 items

## 2017-08-26 NOTE — H&P (Signed)
The patient is a 67 year old femalewho presents to the endoscopy department for screening colonoscopy.  Physical:  No distress  Heart regular rhythm  Lungs clear  Abdomen soft and nontender  Impression: Screening for colon cancer  Plan: Colonoscopy

## 2017-08-26 NOTE — Op Note (Addendum)
[facility] Patient Name: [name] Procedure Date : 08/26/2017 MRN: [id number] Attending MD: Graylin Shiver , MD Date of Birth: 1951/03/09 CSN: [id number] Age: 67 Admit Type: Outpatient Procedure:                Colonoscopy Indications:              Screening for colorectal malignant neoplasm Providers:                Graylin Shiver, MD, Norman Clay, RN, Harrington Challenger,                            Technician Referring MD:              Medicines:                Propofol per Anesthesia Complications:            No immediate complications. Estimated Blood Loss:     . Procedure:                Pre-Anesthesia Assessment:                           - Prior to the procedure, a History and Physical                            was performed, and patient medications and                            allergies were reviewed. The patient's tolerance of                            previous anesthesia was also reviewed. The risks                            and benefits of the procedure and the sedation                            options and risks were discussed with the patient.                            All questions were answered, and informed consent                            was obtained. Prior Anticoagulants: The patient has                            taken no previous anticoagulant or antiplatelet                            agents. ASA Grade Assessment: III - A patient with                            severe systemic disease. After reviewing the risks  and benefits, the patient was deemed in                            satisfactory condition to undergo the procedure.                           After obtaining informed consent, the colonoscope                            was passed under direct vision. Throughout the                            procedure, the patient's blood pressure, pulse, and                            oxygen saturations were monitored  continuously. The                            EC-[id number] ([id number] scope was introduced through                            the anus and advanced to the the cecum, identified                            by appendiceal orifice and ileocecal valve. The                            ileocecal valve, appendiceal orifice, and rectum                            were photographed. The colonoscopy was performed                            without difficulty. The patient tolerated the                            procedure well. The quality of the bowel                            preparation was good. Scope In: 1:36:15 PM Scope Out: 1:48:41 PM Total Procedure Duration: 0 hours 12 minutes 26 seconds  Findings:      The perianal and digital rectal examinations were normal.      The colon (entire examined portion) appeared normal. Impression:               - The entire examined colon is normal.                           - No specimens collected. Moderate Sedation:      . Recommendation:           - Resume regular diet.                           - Continue present medications.                           -  Repeat colonoscopy is not recommended for                            screening purposes. Procedure Code(s):        --- Professional ---                           [phone], Colonoscopy, flexible; diagnostic, including                            collection of specimen(s) by brushing or washing,                            when performed (separate procedure) Diagnosis Code(s):        --- Professional ---                           Z12.11, Encounter for screening for malignant                            neoplasm of colon CPT copyright 2016 American Medical Association. All rights reserved. The codes documented in this report are preliminary and upon coder review may  be revised to meet current compliance requirements. Graylin Shiver, MD 08/26/2017 1:54:42 PM This report has been signed electronically. Number of  Addenda: 0

## 2017-08-26 NOTE — Anesthesia Preprocedure Evaluation (Addendum)
Anesthesia Evaluation  Patient identified by MRN, date of birth, ID band Patient awake    Reviewed: Allergy & Precautions, NPO status , Patient's Chart, lab work & pertinent test results  Airway Mallampati: II  TM Distance: >3 FB Neck ROM: Full    Dental  (+) Partial Upper, Partial Lower   Pulmonary former smoker,    Pulmonary exam normal breath sounds clear to auscultation       Cardiovascular negative cardio ROS Normal cardiovascular exam Rhythm:Regular Rate:Normal     Neuro/Psych  Headaches, negative psych ROS   GI/Hepatic Neg liver ROS, GERD  Controlled,  Endo/Other  diabetes, Oral Hypoglycemic AgentsMorbid obesity  Renal/GU negative Renal ROS     Musculoskeletal negative musculoskeletal ROS (+)   Abdominal (+) + obese,   Peds  Hematology HLD   Anesthesia Other Findings Screening for colon cancer  Reproductive/Obstetrics                            Anesthesia Physical Anesthesia Plan  ASA: III  Anesthesia Plan: MAC   Post-op Pain Management:    Induction: Intravenous  PONV Risk Score and Plan: 2 and Propofol infusion and Treatment may vary due to age or medical condition  Airway Management Planned: Natural Airway  Additional Equipment:   Intra-op Plan:   Post-operative Plan:   Informed Consent: I have reviewed the patients History and Physical, chart, labs and discussed the procedure including the risks, benefits and alternatives for the proposed anesthesia with the patient or authorized representative who has indicated his/her understanding and acceptance.   Dental advisory given  Plan Discussed with: CRNA  Anesthesia Plan Comments:         Anesthesia Quick Evaluation

## 2017-08-26 NOTE — Anesthesia Postprocedure Evaluation (Signed)
Anesthesia Post Note  Patient: [name]  Procedure(s) Performed: COLONOSCOPY WITH PROPOFOL (N/A )     Patient location during evaluation: PACU Anesthesia Type: MAC Level of consciousness: awake and alert Pain management: pain level controlled Vital Signs Assessment: post-procedure vital signs reviewed and stable Respiratory status: spontaneous breathing, nonlabored ventilation, respiratory function stable and patient connected to nasal cannula oxygen Cardiovascular status: stable and blood pressure returned to baseline Postop Assessment: no apparent nausea or vomiting Anesthetic complications: no    Last Vitals:  Vitals:   08/26/17 1425 08/26/17 1430  BP: (!) 111/55   Pulse: 68 (!) 57  Resp: 20 18  Temp:    SpO2: 97% 100%    Last Pain:  Vitals:   08/26/17 1357  TempSrc: Oral  PainSc: 0-No pain                 Barnet Glasgow

## 2017-08-26 NOTE — Discharge Instructions (Signed)

## 2017-08-26 NOTE — Transfer of Care (Signed)
Immediate Anesthesia Transfer of Care Note  Patient: [name]  Procedure(s) Performed: COLONOSCOPY WITH PROPOFOL (N/A )  Patient Location: PACU  Anesthesia Type:MAC  Level of Consciousness: awake and alert   Airway & Oxygen Therapy: Patient Spontanous Breathing and Patient connected to nasal cannula oxygen  Post-op Assessment: Report given to RN and Post -op Vital signs reviewed and stable  Post vital signs: Reviewed and stable  Last Vitals:  Vitals Value Taken Time  BP 98/35 08/26/2017  1:57 PM  Temp 36.6 C 08/26/2017  1:57 PM  Pulse 72 08/26/2017  1:59 PM  Resp 14 08/26/2017  1:59 PM  SpO2 99 % 08/26/2017  1:59 PM  Vitals shown include unvalidated device data.  Last Pain:  Vitals:   08/26/17 1357  TempSrc: Oral  PainSc: 0-No pain         Complications: No apparent anesthesia complications

## 2017-08-27 ENCOUNTER — Encounter (HOSPITAL_COMMUNITY): Payer: Self-pay | Admitting: Gastroenterology

## 2017-08-28 DIAGNOSIS — M810 Age-related osteoporosis without current pathological fracture: Secondary | ICD-10-CM | POA: Diagnosis not present

## 2017-10-10 DIAGNOSIS — R69 Illness, unspecified: Secondary | ICD-10-CM | POA: Diagnosis not present

## 2017-11-28 DIAGNOSIS — M25562 Pain in left knee: Secondary | ICD-10-CM | POA: Diagnosis not present

## 2017-11-28 DIAGNOSIS — M1712 Unilateral primary osteoarthritis, left knee: Secondary | ICD-10-CM | POA: Diagnosis not present

## 2017-11-28 DIAGNOSIS — Z96651 Presence of right artificial knee joint: Secondary | ICD-10-CM | POA: Diagnosis not present

## 2017-11-29 DIAGNOSIS — M545 Low back pain: Secondary | ICD-10-CM | POA: Diagnosis not present

## 2018-01-15 DIAGNOSIS — R69 Illness, unspecified: Secondary | ICD-10-CM | POA: Diagnosis not present

## 2018-02-20 DIAGNOSIS — R69 Illness, unspecified: Secondary | ICD-10-CM | POA: Diagnosis not present

## 2018-03-02 DIAGNOSIS — R600 Localized edema: Secondary | ICD-10-CM | POA: Diagnosis not present

## 2018-03-02 DIAGNOSIS — M810 Age-related osteoporosis without current pathological fracture: Secondary | ICD-10-CM | POA: Diagnosis not present

## 2018-03-02 DIAGNOSIS — Z6841 Body Mass Index (BMI) 40.0 and over, adult: Secondary | ICD-10-CM | POA: Diagnosis not present

## 2018-03-02 DIAGNOSIS — Z23 Encounter for immunization: Secondary | ICD-10-CM | POA: Diagnosis not present

## 2018-03-02 DIAGNOSIS — E785 Hyperlipidemia, unspecified: Secondary | ICD-10-CM | POA: Diagnosis not present

## 2018-03-02 DIAGNOSIS — Z7984 Long term (current) use of oral hypoglycemic drugs: Secondary | ICD-10-CM | POA: Diagnosis not present

## 2018-03-02 DIAGNOSIS — E1169 Type 2 diabetes mellitus with other specified complication: Secondary | ICD-10-CM | POA: Diagnosis not present

## 2018-04-06 DIAGNOSIS — M545 Low back pain: Secondary | ICD-10-CM | POA: Diagnosis not present

## 2018-04-20 DIAGNOSIS — R69 Illness, unspecified: Secondary | ICD-10-CM | POA: Diagnosis not present

## 2018-04-23 ENCOUNTER — Other Ambulatory Visit: Payer: Self-pay | Admitting: Family Medicine

## 2018-04-23 DIAGNOSIS — Z1231 Encounter for screening mammogram for malignant neoplasm of breast: Secondary | ICD-10-CM

## 2018-04-28 ENCOUNTER — Ambulatory Visit
Admission: RE | Admit: 2018-04-28 | Discharge: 2018-04-28 | Disposition: A | Discharge status: Home / Self Care | Payer: Medicare HMO | Source: Ambulatory Visit | Attending: Family Medicine | Admitting: Family Medicine

## 2018-04-28 DIAGNOSIS — Z1231 Encounter for screening mammogram for malignant neoplasm of breast: Secondary | ICD-10-CM

## 2018-04-28 DIAGNOSIS — H6691 Otitis media, unspecified, right ear: Secondary | ICD-10-CM | POA: Diagnosis not present

## 2018-05-19 DIAGNOSIS — R69 Illness, unspecified: Secondary | ICD-10-CM | POA: Diagnosis not present

## 2018-06-10 DIAGNOSIS — M545 Low back pain: Secondary | ICD-10-CM | POA: Diagnosis not present

## 2018-06-17 DIAGNOSIS — M5136 Other intervertebral disc degeneration, lumbar region: Secondary | ICD-10-CM | POA: Diagnosis not present

## 2018-06-17 DIAGNOSIS — M5416 Radiculopathy, lumbar region: Secondary | ICD-10-CM | POA: Diagnosis not present

## 2018-06-25 DIAGNOSIS — M5416 Radiculopathy, lumbar region: Secondary | ICD-10-CM | POA: Diagnosis not present

## 2018-06-29 DIAGNOSIS — M810 Age-related osteoporosis without current pathological fracture: Secondary | ICD-10-CM | POA: Diagnosis not present

## 2018-06-29 DIAGNOSIS — E1169 Type 2 diabetes mellitus with other specified complication: Secondary | ICD-10-CM | POA: Diagnosis not present

## 2018-06-29 DIAGNOSIS — Z1389 Encounter for screening for other disorder: Secondary | ICD-10-CM | POA: Diagnosis not present

## 2018-06-29 DIAGNOSIS — E785 Hyperlipidemia, unspecified: Secondary | ICD-10-CM | POA: Diagnosis not present

## 2018-06-29 DIAGNOSIS — Z6841 Body Mass Index (BMI) 40.0 and over, adult: Secondary | ICD-10-CM | POA: Diagnosis not present

## 2018-06-29 DIAGNOSIS — Z0000 Encounter for general adult medical examination without abnormal findings: Secondary | ICD-10-CM | POA: Diagnosis not present

## 2018-06-29 DIAGNOSIS — Z7984 Long term (current) use of oral hypoglycemic drugs: Secondary | ICD-10-CM | POA: Diagnosis not present

## 2018-06-29 DIAGNOSIS — R05 Cough: Secondary | ICD-10-CM | POA: Diagnosis not present

## 2018-07-07 DIAGNOSIS — M545 Low back pain: Secondary | ICD-10-CM | POA: Diagnosis not present

## 2018-07-14 DIAGNOSIS — M545 Low back pain: Secondary | ICD-10-CM | POA: Diagnosis not present

## 2018-07-22 DIAGNOSIS — M5416 Radiculopathy, lumbar region: Secondary | ICD-10-CM | POA: Diagnosis not present

## 2018-07-27 DIAGNOSIS — T8484XD Pain due to internal orthopedic prosthetic devices, implants and grafts, subsequent encounter: Secondary | ICD-10-CM | POA: Diagnosis not present

## 2018-07-27 DIAGNOSIS — Z471 Aftercare following joint replacement surgery: Secondary | ICD-10-CM | POA: Diagnosis not present

## 2018-07-27 DIAGNOSIS — Z96651 Presence of right artificial knee joint: Secondary | ICD-10-CM | POA: Diagnosis not present

## 2018-07-27 DIAGNOSIS — E669 Obesity, unspecified: Secondary | ICD-10-CM | POA: Diagnosis not present

## 2018-07-29 DIAGNOSIS — M5416 Radiculopathy, lumbar region: Secondary | ICD-10-CM | POA: Diagnosis not present

## 2018-08-05 DIAGNOSIS — M545 Low back pain: Secondary | ICD-10-CM | POA: Diagnosis not present

## 2018-08-12 DIAGNOSIS — M545 Low back pain: Secondary | ICD-10-CM | POA: Diagnosis not present

## 2018-11-03 DIAGNOSIS — H524 Presbyopia: Secondary | ICD-10-CM | POA: Diagnosis not present

## 2018-11-03 DIAGNOSIS — H52223 Regular astigmatism, bilateral: Secondary | ICD-10-CM | POA: Diagnosis not present

## 2018-11-03 DIAGNOSIS — H25813 Combined forms of age-related cataract, bilateral: Secondary | ICD-10-CM | POA: Diagnosis not present

## 2018-11-03 DIAGNOSIS — E119 Type 2 diabetes mellitus without complications: Secondary | ICD-10-CM | POA: Diagnosis not present

## 2018-11-27 DIAGNOSIS — Z471 Aftercare following joint replacement surgery: Secondary | ICD-10-CM | POA: Diagnosis not present

## 2018-11-27 DIAGNOSIS — Z96651 Presence of right artificial knee joint: Secondary | ICD-10-CM | POA: Diagnosis not present

## 2018-12-09 DIAGNOSIS — M545 Low back pain: Secondary | ICD-10-CM | POA: Diagnosis not present

## 2018-12-17 DIAGNOSIS — M5416 Radiculopathy, lumbar region: Secondary | ICD-10-CM | POA: Diagnosis not present

## 2018-12-24 DIAGNOSIS — M545 Low back pain: Secondary | ICD-10-CM | POA: Diagnosis not present

## 2019-01-28 DIAGNOSIS — E1169 Type 2 diabetes mellitus with other specified complication: Secondary | ICD-10-CM | POA: Diagnosis not present

## 2019-01-28 DIAGNOSIS — M810 Age-related osteoporosis without current pathological fracture: Secondary | ICD-10-CM | POA: Diagnosis not present

## 2019-01-28 DIAGNOSIS — E785 Hyperlipidemia, unspecified: Secondary | ICD-10-CM | POA: Diagnosis not present

## 2019-01-28 DIAGNOSIS — Z23 Encounter for immunization: Secondary | ICD-10-CM | POA: Diagnosis not present

## 2019-01-28 DIAGNOSIS — R600 Localized edema: Secondary | ICD-10-CM | POA: Diagnosis not present

## 2019-01-28 DIAGNOSIS — Z7984 Long term (current) use of oral hypoglycemic drugs: Secondary | ICD-10-CM | POA: Diagnosis not present

## 2019-02-17 DIAGNOSIS — Z20828 Contact with and (suspected) exposure to other viral communicable diseases: Secondary | ICD-10-CM | POA: Diagnosis not present

## 2019-03-26 DIAGNOSIS — M545 Low back pain: Secondary | ICD-10-CM | POA: Diagnosis not present

## 2019-04-12 ENCOUNTER — Other Ambulatory Visit: Payer: Self-pay | Admitting: Family Medicine

## 2019-04-12 DIAGNOSIS — Z1231 Encounter for screening mammogram for malignant neoplasm of breast: Secondary | ICD-10-CM

## 2019-06-02 ENCOUNTER — Other Ambulatory Visit: Payer: Self-pay

## 2019-06-02 ENCOUNTER — Ambulatory Visit
Admission: RE | Admit: 2019-06-02 | Discharge: 2019-06-02 | Disposition: A | Discharge status: Home / Self Care | Payer: Medicare HMO | Source: Ambulatory Visit | Attending: Family Medicine | Admitting: Family Medicine

## 2019-06-02 DIAGNOSIS — Z1231 Encounter for screening mammogram for malignant neoplasm of breast: Secondary | ICD-10-CM | POA: Diagnosis not present

## 2019-07-14 DIAGNOSIS — Z1389 Encounter for screening for other disorder: Secondary | ICD-10-CM | POA: Diagnosis not present

## 2019-07-14 DIAGNOSIS — M810 Age-related osteoporosis without current pathological fracture: Secondary | ICD-10-CM | POA: Diagnosis not present

## 2019-07-14 DIAGNOSIS — E1169 Type 2 diabetes mellitus with other specified complication: Secondary | ICD-10-CM | POA: Diagnosis not present

## 2019-07-14 DIAGNOSIS — E785 Hyperlipidemia, unspecified: Secondary | ICD-10-CM | POA: Diagnosis not present

## 2019-07-14 DIAGNOSIS — Z0000 Encounter for general adult medical examination without abnormal findings: Secondary | ICD-10-CM | POA: Diagnosis not present

## 2019-07-15 ENCOUNTER — Other Ambulatory Visit: Payer: Self-pay | Admitting: Family Medicine

## 2019-07-15 DIAGNOSIS — E2839 Other primary ovarian failure: Secondary | ICD-10-CM

## 2019-07-31 DIAGNOSIS — Z23 Encounter for immunization: Secondary | ICD-10-CM | POA: Diagnosis not present

## 2019-09-07 DIAGNOSIS — E119 Type 2 diabetes mellitus without complications: Secondary | ICD-10-CM | POA: Diagnosis not present

## 2019-09-07 DIAGNOSIS — H43812 Vitreous degeneration, left eye: Secondary | ICD-10-CM | POA: Diagnosis not present

## 2019-09-24 DIAGNOSIS — E785 Hyperlipidemia, unspecified: Secondary | ICD-10-CM | POA: Diagnosis not present

## 2019-09-24 DIAGNOSIS — E1169 Type 2 diabetes mellitus with other specified complication: Secondary | ICD-10-CM | POA: Diagnosis not present

## 2019-09-24 DIAGNOSIS — M810 Age-related osteoporosis without current pathological fracture: Secondary | ICD-10-CM | POA: Diagnosis not present

## 2019-09-24 DIAGNOSIS — E119 Type 2 diabetes mellitus without complications: Secondary | ICD-10-CM | POA: Diagnosis not present

## 2019-10-04 ENCOUNTER — Ambulatory Visit
Admission: RE | Admit: 2019-10-04 | Discharge: 2019-10-04 | Disposition: A | Discharge status: Home / Self Care | Payer: Medicare HMO | Source: Ambulatory Visit | Attending: Family Medicine | Admitting: Family Medicine

## 2019-10-04 ENCOUNTER — Other Ambulatory Visit: Payer: Self-pay

## 2019-10-04 DIAGNOSIS — M85851 Other specified disorders of bone density and structure, right thigh: Secondary | ICD-10-CM | POA: Diagnosis not present

## 2019-10-04 DIAGNOSIS — E2839 Other primary ovarian failure: Secondary | ICD-10-CM

## 2019-10-04 DIAGNOSIS — Z780 Asymptomatic menopausal state: Secondary | ICD-10-CM | POA: Diagnosis not present

## 2019-10-05 DIAGNOSIS — H43812 Vitreous degeneration, left eye: Secondary | ICD-10-CM | POA: Diagnosis not present

## 2019-11-10 DIAGNOSIS — M810 Age-related osteoporosis without current pathological fracture: Secondary | ICD-10-CM | POA: Diagnosis not present

## 2019-11-10 DIAGNOSIS — E1169 Type 2 diabetes mellitus with other specified complication: Secondary | ICD-10-CM | POA: Diagnosis not present

## 2019-11-10 DIAGNOSIS — E785 Hyperlipidemia, unspecified: Secondary | ICD-10-CM | POA: Diagnosis not present

## 2019-11-10 DIAGNOSIS — E119 Type 2 diabetes mellitus without complications: Secondary | ICD-10-CM | POA: Diagnosis not present

## 2020-01-11 DIAGNOSIS — E785 Hyperlipidemia, unspecified: Secondary | ICD-10-CM | POA: Diagnosis not present

## 2020-01-11 DIAGNOSIS — Z7984 Long term (current) use of oral hypoglycemic drugs: Secondary | ICD-10-CM | POA: Diagnosis not present

## 2020-01-11 DIAGNOSIS — M545 Low back pain: Secondary | ICD-10-CM | POA: Diagnosis not present

## 2020-01-11 DIAGNOSIS — M810 Age-related osteoporosis without current pathological fracture: Secondary | ICD-10-CM | POA: Diagnosis not present

## 2020-01-11 DIAGNOSIS — R600 Localized edema: Secondary | ICD-10-CM | POA: Diagnosis not present

## 2020-01-11 DIAGNOSIS — E1169 Type 2 diabetes mellitus with other specified complication: Secondary | ICD-10-CM | POA: Diagnosis not present

## 2020-02-15 DIAGNOSIS — Z96651 Presence of right artificial knee joint: Secondary | ICD-10-CM | POA: Diagnosis not present

## 2020-02-15 DIAGNOSIS — Z471 Aftercare following joint replacement surgery: Secondary | ICD-10-CM | POA: Diagnosis not present

## 2020-02-15 DIAGNOSIS — M1712 Unilateral primary osteoarthritis, left knee: Secondary | ICD-10-CM | POA: Diagnosis not present

## 2020-02-15 DIAGNOSIS — M25562 Pain in left knee: Secondary | ICD-10-CM | POA: Diagnosis not present

## 2020-02-15 DIAGNOSIS — M25561 Pain in right knee: Secondary | ICD-10-CM | POA: Diagnosis not present

## 2020-02-23 DIAGNOSIS — M5416 Radiculopathy, lumbar region: Secondary | ICD-10-CM | POA: Diagnosis not present

## 2020-02-23 DIAGNOSIS — M5136 Other intervertebral disc degeneration, lumbar region: Secondary | ICD-10-CM | POA: Diagnosis not present

## 2020-02-24 DIAGNOSIS — M25561 Pain in right knee: Secondary | ICD-10-CM | POA: Diagnosis not present

## 2020-02-24 DIAGNOSIS — M25562 Pain in left knee: Secondary | ICD-10-CM | POA: Diagnosis not present

## 2020-02-29 DIAGNOSIS — M25562 Pain in left knee: Secondary | ICD-10-CM | POA: Diagnosis not present

## 2020-02-29 DIAGNOSIS — M25561 Pain in right knee: Secondary | ICD-10-CM | POA: Diagnosis not present

## 2020-03-06 DIAGNOSIS — M25562 Pain in left knee: Secondary | ICD-10-CM | POA: Diagnosis not present

## 2020-03-06 DIAGNOSIS — M25561 Pain in right knee: Secondary | ICD-10-CM | POA: Diagnosis not present

## 2020-03-08 DIAGNOSIS — M25561 Pain in right knee: Secondary | ICD-10-CM | POA: Diagnosis not present

## 2020-03-08 DIAGNOSIS — M25562 Pain in left knee: Secondary | ICD-10-CM | POA: Diagnosis not present

## 2020-03-13 DIAGNOSIS — M25562 Pain in left knee: Secondary | ICD-10-CM | POA: Diagnosis not present

## 2020-03-13 DIAGNOSIS — M25561 Pain in right knee: Secondary | ICD-10-CM | POA: Diagnosis not present

## 2020-03-15 DIAGNOSIS — M25562 Pain in left knee: Secondary | ICD-10-CM | POA: Diagnosis not present

## 2020-03-15 DIAGNOSIS — M25561 Pain in right knee: Secondary | ICD-10-CM | POA: Diagnosis not present

## 2020-03-20 DIAGNOSIS — M25561 Pain in right knee: Secondary | ICD-10-CM | POA: Diagnosis not present

## 2020-03-20 DIAGNOSIS — M25562 Pain in left knee: Secondary | ICD-10-CM | POA: Diagnosis not present

## 2020-03-21 DIAGNOSIS — E1169 Type 2 diabetes mellitus with other specified complication: Secondary | ICD-10-CM | POA: Diagnosis not present

## 2020-03-21 DIAGNOSIS — M810 Age-related osteoporosis without current pathological fracture: Secondary | ICD-10-CM | POA: Diagnosis not present

## 2020-03-21 DIAGNOSIS — E785 Hyperlipidemia, unspecified: Secondary | ICD-10-CM | POA: Diagnosis not present

## 2020-03-22 DIAGNOSIS — M25562 Pain in left knee: Secondary | ICD-10-CM | POA: Diagnosis not present

## 2020-03-22 DIAGNOSIS — M25561 Pain in right knee: Secondary | ICD-10-CM | POA: Diagnosis not present

## 2020-04-05 DIAGNOSIS — M5136 Other intervertebral disc degeneration, lumbar region: Secondary | ICD-10-CM | POA: Diagnosis not present

## 2020-05-01 ENCOUNTER — Other Ambulatory Visit: Payer: Self-pay | Admitting: Family Medicine

## 2020-05-01 DIAGNOSIS — Z1231 Encounter for screening mammogram for malignant neoplasm of breast: Secondary | ICD-10-CM

## 2020-05-08 DIAGNOSIS — M5136 Other intervertebral disc degeneration, lumbar region: Secondary | ICD-10-CM | POA: Diagnosis not present

## 2020-05-08 DIAGNOSIS — M5416 Radiculopathy, lumbar region: Secondary | ICD-10-CM | POA: Diagnosis not present

## 2020-05-10 DIAGNOSIS — M25562 Pain in left knee: Secondary | ICD-10-CM | POA: Diagnosis not present

## 2020-05-10 DIAGNOSIS — Z96651 Presence of right artificial knee joint: Secondary | ICD-10-CM | POA: Diagnosis not present

## 2020-05-10 DIAGNOSIS — T8484XA Pain due to internal orthopedic prosthetic devices, implants and grafts, initial encounter: Secondary | ICD-10-CM | POA: Diagnosis not present

## 2020-05-10 DIAGNOSIS — Z6841 Body Mass Index (BMI) 40.0 and over, adult: Secondary | ICD-10-CM | POA: Diagnosis not present

## 2020-06-07 DIAGNOSIS — Z03818 Encounter for observation for suspected exposure to other biological agents ruled out: Secondary | ICD-10-CM | POA: Diagnosis not present

## 2020-06-13 ENCOUNTER — Ambulatory Visit: Payer: Medicare HMO

## 2020-06-26 DIAGNOSIS — E1169 Type 2 diabetes mellitus with other specified complication: Secondary | ICD-10-CM | POA: Diagnosis not present

## 2020-06-26 DIAGNOSIS — E785 Hyperlipidemia, unspecified: Secondary | ICD-10-CM | POA: Diagnosis not present

## 2020-06-26 DIAGNOSIS — M810 Age-related osteoporosis without current pathological fracture: Secondary | ICD-10-CM | POA: Diagnosis not present

## 2020-07-19 ENCOUNTER — Ambulatory Visit
Admission: RE | Admit: 2020-07-19 | Discharge: 2020-07-19 | Disposition: A | Discharge status: Home / Self Care | Payer: Medicare HMO | Source: Ambulatory Visit | Attending: Family Medicine | Admitting: Family Medicine

## 2020-07-19 ENCOUNTER — Other Ambulatory Visit: Payer: Self-pay

## 2020-07-19 DIAGNOSIS — Z1231 Encounter for screening mammogram for malignant neoplasm of breast: Secondary | ICD-10-CM | POA: Diagnosis not present

## 2020-08-01 DIAGNOSIS — M810 Age-related osteoporosis without current pathological fracture: Secondary | ICD-10-CM | POA: Diagnosis not present

## 2020-08-01 DIAGNOSIS — E1169 Type 2 diabetes mellitus with other specified complication: Secondary | ICD-10-CM | POA: Diagnosis not present

## 2020-08-01 DIAGNOSIS — Z7984 Long term (current) use of oral hypoglycemic drugs: Secondary | ICD-10-CM | POA: Diagnosis not present

## 2020-08-01 DIAGNOSIS — Z1159 Encounter for screening for other viral diseases: Secondary | ICD-10-CM | POA: Diagnosis not present

## 2020-08-01 DIAGNOSIS — Z0000 Encounter for general adult medical examination without abnormal findings: Secondary | ICD-10-CM | POA: Diagnosis not present

## 2020-08-01 DIAGNOSIS — Z1389 Encounter for screening for other disorder: Secondary | ICD-10-CM | POA: Diagnosis not present

## 2020-08-01 DIAGNOSIS — E785 Hyperlipidemia, unspecified: Secondary | ICD-10-CM | POA: Diagnosis not present

## 2020-08-22 DIAGNOSIS — E1169 Type 2 diabetes mellitus with other specified complication: Secondary | ICD-10-CM | POA: Diagnosis not present

## 2020-08-22 DIAGNOSIS — M810 Age-related osteoporosis without current pathological fracture: Secondary | ICD-10-CM | POA: Diagnosis not present

## 2020-08-22 DIAGNOSIS — E785 Hyperlipidemia, unspecified: Secondary | ICD-10-CM | POA: Diagnosis not present

## 2020-10-03 DIAGNOSIS — J069 Acute upper respiratory infection, unspecified: Secondary | ICD-10-CM | POA: Diagnosis not present

## 2020-10-17 DIAGNOSIS — E785 Hyperlipidemia, unspecified: Secondary | ICD-10-CM | POA: Diagnosis not present

## 2020-10-17 DIAGNOSIS — E1169 Type 2 diabetes mellitus with other specified complication: Secondary | ICD-10-CM | POA: Diagnosis not present

## 2020-10-17 DIAGNOSIS — M810 Age-related osteoporosis without current pathological fracture: Secondary | ICD-10-CM | POA: Diagnosis not present

## 2020-11-30 ENCOUNTER — Telehealth: Payer: Self-pay

## 2020-11-30 NOTE — Telephone Encounter (Signed)
Call from pt who is interested in PREP Has flyer for program Does have some significant Knee and back pain interested in exercising more Currently does exercise in pool at [facility]. Sounds like back pain has not been fully worked up so asked that she f/u with her PCP so we can clear her to exercise. I will reach back to her when I start the next class at [facility].

## 2020-12-12 DIAGNOSIS — E119 Type 2 diabetes mellitus without complications: Secondary | ICD-10-CM | POA: Diagnosis not present

## 2020-12-12 DIAGNOSIS — H43812 Vitreous degeneration, left eye: Secondary | ICD-10-CM | POA: Diagnosis not present

## 2020-12-12 DIAGNOSIS — H25813 Combined forms of age-related cataract, bilateral: Secondary | ICD-10-CM | POA: Diagnosis not present

## 2020-12-25 DIAGNOSIS — M25561 Pain in right knee: Secondary | ICD-10-CM | POA: Diagnosis not present

## 2020-12-25 DIAGNOSIS — Z96651 Presence of right artificial knee joint: Secondary | ICD-10-CM | POA: Diagnosis not present

## 2021-01-05 ENCOUNTER — Telehealth: Payer: Self-pay

## 2021-01-05 NOTE — Telephone Encounter (Signed)
Attempted to reach pt via phone however VM box is not set up.   Sent a text to number requesting call back to schedule intake for PREP.

## 2021-01-10 ENCOUNTER — Telehealth: Payer: Self-pay

## 2021-01-10 NOTE — Telephone Encounter (Signed)
Pt called [name] to do next PREP class  starting on Monday the 15th-has a MD appt and also broke off a tooth and needs a new partial. Requests to start at a later class.  Put on list for next class

## 2021-01-15 DIAGNOSIS — M4696 Unspecified inflammatory spondylopathy, lumbar region: Secondary | ICD-10-CM | POA: Diagnosis not present

## 2021-01-31 DIAGNOSIS — E785 Hyperlipidemia, unspecified: Secondary | ICD-10-CM | POA: Diagnosis not present

## 2021-01-31 DIAGNOSIS — M810 Age-related osteoporosis without current pathological fracture: Secondary | ICD-10-CM | POA: Diagnosis not present

## 2021-01-31 DIAGNOSIS — E1169 Type 2 diabetes mellitus with other specified complication: Secondary | ICD-10-CM | POA: Diagnosis not present

## 2021-02-08 DIAGNOSIS — R609 Edema, unspecified: Secondary | ICD-10-CM | POA: Diagnosis not present

## 2021-02-08 DIAGNOSIS — M810 Age-related osteoporosis without current pathological fracture: Secondary | ICD-10-CM | POA: Diagnosis not present

## 2021-02-08 DIAGNOSIS — Z7984 Long term (current) use of oral hypoglycemic drugs: Secondary | ICD-10-CM | POA: Diagnosis not present

## 2021-02-08 DIAGNOSIS — Z23 Encounter for immunization: Secondary | ICD-10-CM | POA: Diagnosis not present

## 2021-02-08 DIAGNOSIS — E785 Hyperlipidemia, unspecified: Secondary | ICD-10-CM | POA: Diagnosis not present

## 2021-02-08 DIAGNOSIS — E1169 Type 2 diabetes mellitus with other specified complication: Secondary | ICD-10-CM | POA: Diagnosis not present

## 2021-02-15 ENCOUNTER — Telehealth: Payer: Self-pay

## 2021-02-15 NOTE — Telephone Encounter (Signed)
Retrying pt to check interest in participating in PREP at [facility] starting in Oct.  Unable to leave message as voicemail not set up.  Tried other numbers listed without success.

## 2021-03-15 DIAGNOSIS — M5136 Other intervertebral disc degeneration, lumbar region: Secondary | ICD-10-CM | POA: Diagnosis not present

## 2021-03-15 DIAGNOSIS — Z96651 Presence of right artificial knee joint: Secondary | ICD-10-CM | POA: Diagnosis not present

## 2021-03-15 DIAGNOSIS — T8484XA Pain due to internal orthopedic prosthetic devices, implants and grafts, initial encounter: Secondary | ICD-10-CM | POA: Diagnosis not present

## 2021-03-15 DIAGNOSIS — M25562 Pain in left knee: Secondary | ICD-10-CM | POA: Diagnosis not present

## 2021-03-21 DIAGNOSIS — E1169 Type 2 diabetes mellitus with other specified complication: Secondary | ICD-10-CM | POA: Diagnosis not present

## 2021-03-21 DIAGNOSIS — M810 Age-related osteoporosis without current pathological fracture: Secondary | ICD-10-CM | POA: Diagnosis not present

## 2021-03-21 DIAGNOSIS — E785 Hyperlipidemia, unspecified: Secondary | ICD-10-CM | POA: Diagnosis not present

## 2021-03-28 DIAGNOSIS — R69 Illness, unspecified: Secondary | ICD-10-CM | POA: Diagnosis not present

## 2021-03-28 DIAGNOSIS — K006 Disturbances in tooth eruption: Secondary | ICD-10-CM | POA: Diagnosis not present

## 2021-03-29 DIAGNOSIS — K006 Disturbances in tooth eruption: Secondary | ICD-10-CM | POA: Diagnosis not present

## 2021-03-29 DIAGNOSIS — R69 Illness, unspecified: Secondary | ICD-10-CM | POA: Diagnosis not present

## 2021-04-06 ENCOUNTER — Telehealth: Payer: Self-pay

## 2021-04-06 NOTE — Telephone Encounter (Signed)
VMT pt requesting call back reference next PREP class at [place] starting 05/07/21.

## 2021-04-30 DIAGNOSIS — M25562 Pain in left knee: Secondary | ICD-10-CM | POA: Diagnosis not present

## 2021-04-30 DIAGNOSIS — Z96651 Presence of right artificial knee joint: Secondary | ICD-10-CM | POA: Diagnosis not present

## 2021-04-30 DIAGNOSIS — M25561 Pain in right knee: Secondary | ICD-10-CM | POA: Diagnosis not present

## 2021-04-30 DIAGNOSIS — M1712 Unilateral primary osteoarthritis, left knee: Secondary | ICD-10-CM | POA: Diagnosis not present

## 2021-05-22 ENCOUNTER — Telehealth: Payer: Self-pay

## 2021-05-22 DIAGNOSIS — E1169 Type 2 diabetes mellitus with other specified complication: Secondary | ICD-10-CM | POA: Diagnosis not present

## 2021-05-22 DIAGNOSIS — E785 Hyperlipidemia, unspecified: Secondary | ICD-10-CM | POA: Diagnosis not present

## 2021-05-22 DIAGNOSIS — M810 Age-related osteoporosis without current pathological fracture: Secondary | ICD-10-CM | POA: Diagnosis not present

## 2021-05-22 NOTE — Telephone Encounter (Signed)
Noticed missed calls from pt but no message left Attempted to call pt but VM not set up. Text sent to pt asking if she is ready to do PREP at [place] have class starting in Jan.

## 2021-05-31 ENCOUNTER — Telehealth: Payer: Self-pay

## 2021-05-31 NOTE — Telephone Encounter (Signed)
[facility] pt inquiring about next PREP class Returned call Can do 06/04/21 class 1pm-215pm Intake scheduled for 11am on 06/01/21

## 2021-06-01 NOTE — Progress Notes (Signed)
[facility] PREP Evaluation  Patient Details  Name: CARRAH EPPOLITO MRN: [id number] Date of Birth: 04/15/1951 Age: 70 y.o. PCP: Merri Brunette, MD  Vitals:   06/01/21 1147  BP: 102/68  Pulse: 88  SpO2: 95%  Weight: 284 lb 6.4 oz (129 kg)     [facility] Eval - 06/01/21 1100       [facility] "PREP" Location   [facility] "PREP" Location [facility]      Referral    Referring Provider Knowles/Olia/Smith    Reason for referral Diabetes;Inactivity;Obesitity/Overweight;Orthopedic;Other   osteoporosis   Program Start Date 06/04/21   M/W 1p-215pm     Measurement   Waist Circumference 49.5 inches    Hip Circumference 57 inches    Body fat 50.3 percent      Information for Trainer   Goals Lose weight -100 lbs, initally 20 lbs, get more active    Current Exercise None   likes to be in the pool   Orthopedic Concerns RTKR x 5, left knee pain, LBP    Pertinent Medical History DM    Current Barriers none    Restrictions/Precautions Assistive device;Fall risk    Medications that affect exercise Medication causing dizziness/drowsiness      Timed Up and Go (TUGS)   Timed Up and Go Moderate risk 10-12 seconds      Mobility and Daily Activities   I find it easy to walk up or down two or more flights of stairs. 2    I have no trouble taking out the trash. 4    I do housework such as vacuuming and dusting on my own without difficulty. 4    I can easily lift a gallon of milk (8lbs). 4    I can easily walk a mile. 1    I have no trouble reaching into high cupboards or reaching down to pick up something from the floor. 4    I do not have trouble doing out-door work such as Loss adjuster, chartered, raking leaves, or gardening. 2      Mobility and Daily Activities   I feel younger than my age. 4    I feel independent. 4    I feel energetic. 2    I live an active life.  2    I feel strong. 3    I feel healthy. 3    I feel active as other people my age. 3      How fit and strong are you.   Fit and Strong Total  Score 42            Past Medical History:  Diagnosis Date   Diabetes mellitus without complication (HCC)    Type II   GERD (gastroesophageal reflux disease)    ocassional --takes tums   History of blood transfusion    with knee surgeries   Hyperlipemia    Past Surgical History:  Procedure Laterality Date   ABDOMINAL HYSTERECTOMY     CHOLECYSTECTOMY     COLONOSCOPY     COLONOSCOPY WITH PROPOFOL N/A 08/26/2017   Procedure: COLONOSCOPY WITH PROPOFOL;  Surgeon: Graylin Shiver, MD;  Location: [facility] ENDOSCOPY;  Service: Endoscopy;  Laterality: N/A;   LEG SURGERY Right    fracture   REPLACEMENT TOTAL KNEE Right    "5"- patient reports   Social History   Tobacco Use  Smoking Status Former   Years: 1.00   Types: Cigarettes   Quit date: 1999   Years since quitting: 24.0  Smokeless Tobacco Never    [name] 06/01/2021, 11:52 AM

## 2021-06-12 ENCOUNTER — Other Ambulatory Visit: Payer: Self-pay | Admitting: Family Medicine

## 2021-06-12 DIAGNOSIS — Z1231 Encounter for screening mammogram for malignant neoplasm of breast: Secondary | ICD-10-CM

## 2021-06-12 NOTE — Progress Notes (Signed)
[facility] PREP Weekly Session  Patient Details  Name: [name] MRN: [id number] Date of Birth: Feb 28, 1951 Age: 71 y.o. PCP: Carol Ada, MD  Vitals:   06/11/21 1300  Weight: 283 lb 8 oz (128.6 kg)     [facility] Weekly seesion - 06/12/21 1700       [facility] "PREP" Location   [facility] "PREP" Location [facility]      Weekly Session   Topic Discussed Importance of resistance training;Other ways to be active    Minutes exercised this week 405 minutes    Classes attended to date 3           Class held on 06/11/21  Barnett Hatter 06/12/2021, 5:20 PM

## 2021-06-20 NOTE — Progress Notes (Signed)
[facility] PREP Weekly Session  Patient Details  Name: [name] MRN: [id number] Date of Birth: [birth year] Age: 71 y.o. PCP: Merri Brunette, MD  Vitals:   06/18/21 1300  Weight: 280 lb 6.4 oz (127.2 kg)     [facility] Weekly seesion - 06/20/21 1000       [facility] "PREP" Location   [facility] "PREP" Location [facility]      Weekly Session   Topic Discussed Healthy eating tips   sugar demo   Minutes exercised this week 120 minutes    Classes attended to date 5            Class held on 06/18/21  [name] 06/20/2021, 10:55 AM

## 2021-06-22 DIAGNOSIS — M810 Age-related osteoporosis without current pathological fracture: Secondary | ICD-10-CM | POA: Diagnosis not present

## 2021-06-22 DIAGNOSIS — E1169 Type 2 diabetes mellitus with other specified complication: Secondary | ICD-10-CM | POA: Diagnosis not present

## 2021-06-22 DIAGNOSIS — E785 Hyperlipidemia, unspecified: Secondary | ICD-10-CM | POA: Diagnosis not present

## 2021-06-27 NOTE — Progress Notes (Signed)
[facility] PREP Weekly Session  Patient Details  Name: [name] MRN: [id number] Date of Birth: Sep 11, 1950 Age: 71 y.o. PCP: Carol Ada, MD  Vitals:   06/25/21 1300  Weight: 282 lb 6.4 oz (128.1 kg)     [facility] Weekly seesion - 06/27/21 1200       [facility] "PREP" Location   [facility] "PREP" Location [facility]      Weekly Session   Topic Discussed Health habits    Minutes exercised this week 490 minutes    Classes attended to date 7           Late entry: class held on 06/25/21  Barnett Hatter 06/27/2021, 12:33 PM

## 2021-07-03 NOTE — Progress Notes (Signed)
[facility] PREP Weekly Session  Patient Details  Name: [name] MRN: [id number] Date of Birth: 23-Dec-1950 Age: 71 y.o. PCP: Merri Brunette, MD  Vitals:   07/02/21 1300  Weight: 271 lb 12.8 oz (123.3 kg)     [facility] Weekly seesion - 07/03/21 1600       [facility] "PREP" Location   [facility] "PREP" Location [facility]      Weekly Session   Topic Discussed Restaurant Eating    Minutes exercised this week 480 minutes    Classes attended to date 9            Class held on 07/02/21 [name] 07/03/2021, 4:37 PM

## 2021-07-06 DIAGNOSIS — H25813 Combined forms of age-related cataract, bilateral: Secondary | ICD-10-CM | POA: Diagnosis not present

## 2021-07-06 DIAGNOSIS — E119 Type 2 diabetes mellitus without complications: Secondary | ICD-10-CM | POA: Diagnosis not present

## 2021-07-07 DIAGNOSIS — H524 Presbyopia: Secondary | ICD-10-CM | POA: Diagnosis not present

## 2021-07-07 DIAGNOSIS — H52223 Regular astigmatism, bilateral: Secondary | ICD-10-CM | POA: Diagnosis not present

## 2021-07-10 NOTE — Progress Notes (Signed)
[facility] PREP Weekly Session  Patient Details  Name: [name] MRN: [id number] Date of Birth: 08/10/1950 Age: 71 y.o. PCP: Merri Brunette, MD  Vitals:   07/09/21 1300  Weight: 273 lb (123.8 kg)     [facility] Weekly seesion - 07/10/21 1700       [facility] "PREP" Location   [facility]      Weekly Session   Topic Discussed Stress management and problem solving   meditation, chair yoga   Minutes exercised this week 600 minutes    Classes attended to date 10             [name] 07/10/2021, 5:50 PM

## 2021-07-19 NOTE — Progress Notes (Signed)
[facility] PREP Weekly Session  Patient Details  Name: [name] MRN: [id number] Date of Birth: 12-14-1950 Age: 71 y.o. PCP: Carol Ada, MD  Vitals:   07/16/21 1300  Weight: 268 lb 9.6 oz (121.8 kg)     [facility] Weekly seesion - 07/19/21 1700       [facility] "PREP" Location   [facility] "PREP" Location [facility]      Weekly Session   Topic Discussed Expectations and non-scale victories    Minutes exercised this week 780 minutes    Classes attended to date 69            Late entry, class held on 07/16/21 Barnett Hatter 07/19/2021, 5:04 PM

## 2021-07-20 ENCOUNTER — Ambulatory Visit
Admission: RE | Admit: 2021-07-20 | Discharge: 2021-07-20 | Disposition: A | Discharge status: Home / Self Care | Payer: Medicare HMO | Source: Ambulatory Visit | Attending: Family Medicine | Admitting: Family Medicine

## 2021-07-20 DIAGNOSIS — Z1231 Encounter for screening mammogram for malignant neoplasm of breast: Secondary | ICD-10-CM

## 2021-07-25 DIAGNOSIS — E785 Hyperlipidemia, unspecified: Secondary | ICD-10-CM | POA: Diagnosis not present

## 2021-07-25 DIAGNOSIS — E1169 Type 2 diabetes mellitus with other specified complication: Secondary | ICD-10-CM | POA: Diagnosis not present

## 2021-08-01 DIAGNOSIS — R69 Illness, unspecified: Secondary | ICD-10-CM | POA: Diagnosis not present

## 2021-08-02 NOTE — Progress Notes (Signed)
[facility] PREP Weekly Session  Patient Details  Name: [name] MRN: [id number] Date of Birth: May 04, 1951 Age: 71 y.o. PCP: Carol Ada, MD  Vitals:   07/30/21 1330  Weight: 269 lb (122 kg)     [facility] Weekly seesion - 08/02/21 1700       [facility] "PREP" Location   [facility]      Weekly Session   Topic Discussed Finding support    Minutes exercised this week 480 minutes    Classes attended to date 17             Barnett Hatter 08/02/2021, 5:06 PM

## 2021-08-06 NOTE — Progress Notes (Signed)
[facility] PREP Weekly Session ? ?Patient Details  ?Name: [name] ?MRN: [id number] ?Date of Birth: 04-Oct-1950 ?Age: 71 y.o. ?PCP: Merri Brunette, MD ? ?Vitals:  ? 08/06/21 1455  ?Weight: 267 lb 12.8 oz (121.5 kg)  ? ? ? [facility] Weekly seesion - 08/06/21 1400   ? ?  ? [facility] "PREP" Location  ? [facility]   ?  ? Weekly Session  ? Topic Discussed Calorie breakdown   ? Minutes exercised this week 240 minutes   4 days of stepper at home 10K steps each  ? Classes attended to date 53   ? ?  ?  ? ?  ? ? ?[name] ?08/06/2021, 2:56 PM ? ? ?

## 2021-08-16 DIAGNOSIS — E785 Hyperlipidemia, unspecified: Secondary | ICD-10-CM | POA: Diagnosis not present

## 2021-08-16 DIAGNOSIS — Z0000 Encounter for general adult medical examination without abnormal findings: Secondary | ICD-10-CM | POA: Diagnosis not present

## 2021-08-16 DIAGNOSIS — Z1389 Encounter for screening for other disorder: Secondary | ICD-10-CM | POA: Diagnosis not present

## 2021-08-16 DIAGNOSIS — E1169 Type 2 diabetes mellitus with other specified complication: Secondary | ICD-10-CM | POA: Diagnosis not present

## 2021-08-16 DIAGNOSIS — M810 Age-related osteoporosis without current pathological fracture: Secondary | ICD-10-CM | POA: Diagnosis not present

## 2021-08-16 DIAGNOSIS — Z23 Encounter for immunization: Secondary | ICD-10-CM | POA: Diagnosis not present

## 2021-08-20 NOTE — Progress Notes (Signed)
[facility] PREP Weekly Session ? ?Patient Details  ?Name: [name] ?MRN: [id number] ?Date of Birth: 1951-05-03 ?Age: 71 y.o. ?PCP: Merri Brunette, MD ? ?Vitals:  ? 08/20/21 1438  ?Weight: 265 lb 6.4 oz (120.4 kg)  ? ? ? [facility] Weekly seesion - 08/20/21 1400   ? ?  ? [facility]PREP" Location  ? [facility]   ?  ? Weekly Session  ? Topic Discussed Hitting roadblocks   ? Minutes exercised this week 160 minutes   10k steps 5 days, 2 days of strength  ? Classes attended to date 67   ? ?  ?  ? ?  ? ? ?[name] ?08/20/2021, 2:42 PM ? ? ?

## 2021-08-29 NOTE — Progress Notes (Signed)
[facility] PREP Weekly Session ? ?Patient Details  ?Name: [name] ?MRN: [id number] ?Date of Birth: 04-16-1951 ?Age: 71 y.o. ?PCP: Carol Ada, MD ? ?Vitals:  ? 08/27/21 1300  ?Weight: 268 lb 3.2 oz (121.7 kg)  ? ? ? [facility] Weekly seesion - 08/29/21 0900   ? ?  ? [facility] "PREP" Location  ? [facility] "PREP" Location [place]   ?  ? Weekly Session  ? Topic Discussed --   review of materials, goals, prepare for last class  ? Minutes exercised this week --   Swim 3 days, 4 days 10k steps, 40 mins strength train  ? Classes attended to date 30   ? ?  ?  ? ?  ? ? ?Barnett Hatter ?08/29/2021, 9:10 AM ? ? ?

## 2021-09-04 NOTE — Progress Notes (Signed)
[facility] PREP Evaluation ? ?Patient Details  ?Name: [name] ?MRN: [id number] ?Date of Birth: [birth year] ?Age: 71 y.o. ?PCP: Carol Ada, MD ? ?Vitals:  ? 09/03/21 1200  ?BP: 110/68  ?Pulse: 88  ?SpO2: 98%  ?Weight: 266 lb 6.4 oz (120.8 kg)  ? ? ? [facility] Eval - 09/04/21 1600   ? ?  ? [facility] "PREP" Location  ? [facility] "PREP" Location [place]   ?  ? Referral   ? Referring Provider Candance Armstead Peaks  ? Program Start Date 09/03/21   Final class  ?  ? Measurement  ? Waist Circumference 48 inches   ? Hip Circumference 55 inches   ? Body fat --   unable to measure  ?  ? Information for Trainer  ? Goals Goals reset to cont exercise /lifestyle changes to goal weight   ?  ? Mobility and Daily Activities  ? I find it easy to walk up or down two or more flights of stairs. 1   ? I have no trouble taking out the trash. 4   ? I do housework such as vacuuming and dusting on my own without difficulty. 4   ? I can easily lift a gallon of milk (8lbs). 4   ? I can easily walk a mile. 2   ? I have no trouble reaching into high cupboards or reaching down to pick up something from the floor. 3   ? I do not have trouble doing out-door work such as Armed forces logistics/support/administrative officer, raking leaves, or gardening. 1   ?  ? Mobility and Daily Activities  ? I feel younger than my age. 3   ? I feel independent. 3   ? I feel energetic. 3   ? I live an active life.  4   ? I feel strong. 3   ? I feel healthy. 3   ? I feel active as other people my age. 4   ?  ? How fit and strong are you.  ? Fit and Strong Total Score 42   ? ?  ?  ? ?  ? ?Past Medical History:  ?Diagnosis Date  ? Diabetes mellitus without complication ([place])   ? Type II  ? GERD (gastroesophageal reflux disease)   ? ocassional --takes tums  ? History of blood transfusion   ? with knee surgeries  ? Hyperlipemia   ? ?Past Surgical History:  ?Procedure Laterality Date  ? ABDOMINAL HYSTERECTOMY    ? CHOLECYSTECTOMY    ? COLONOSCOPY    ? COLONOSCOPY WITH PROPOFOL N/A 08/26/2017   ? Procedure: COLONOSCOPY WITH PROPOFOL;  Surgeon: Wonda Horner, MD;  Location: [facility] ENDOSCOPY;  Service: Endoscopy;  Laterality: N/A;  ? LEG SURGERY Right   ? fracture  ? REPLACEMENT TOTAL KNEE Right   ? "5"- patient reports  ? ?Social History  ? ?Tobacco Use  ?Smoking Status Former  ? Years: 1.00  ? Types: Cigarettes  ? Quit date: 1999  ? Years since quitting: 24.2  ?Smokeless Tobacco Never  ? ?Fit test:  ?Cardio march: 238 to 271  ?Sit to stand: push off chair 10 x to 6 (arms down)  ?Bicep curl: 21 to 22  ?Encouraged cont balance exercises  ?Barnett Hatter ?09/04/2021, 4:29 PM ? ? ?

## 2021-10-12 DIAGNOSIS — E785 Hyperlipidemia, unspecified: Secondary | ICD-10-CM | POA: Diagnosis not present

## 2021-10-12 DIAGNOSIS — M810 Age-related osteoporosis without current pathological fracture: Secondary | ICD-10-CM | POA: Diagnosis not present

## 2021-10-12 DIAGNOSIS — E1169 Type 2 diabetes mellitus with other specified complication: Secondary | ICD-10-CM | POA: Diagnosis not present

## 2021-12-18 DIAGNOSIS — E119 Type 2 diabetes mellitus without complications: Secondary | ICD-10-CM | POA: Diagnosis not present

## 2021-12-28 DIAGNOSIS — Z0289 Encounter for other administrative examinations: Secondary | ICD-10-CM

## 2022-01-02 DIAGNOSIS — Z96651 Presence of right artificial knee joint: Secondary | ICD-10-CM | POA: Diagnosis not present

## 2022-01-02 DIAGNOSIS — M1712 Unilateral primary osteoarthritis, left knee: Secondary | ICD-10-CM | POA: Diagnosis not present

## 2022-01-15 ENCOUNTER — Encounter (INDEPENDENT_AMBULATORY_CARE_PROVIDER_SITE_OTHER): Payer: Self-pay | Admitting: Bariatrics

## 2022-01-15 ENCOUNTER — Ambulatory Visit (INDEPENDENT_AMBULATORY_CARE_PROVIDER_SITE_OTHER): Payer: Medicare PPO | Admitting: Bariatrics

## 2022-01-15 VITALS — BP 117/75 | HR 81 | Temp 98.0°F | Ht 63.0 in | Wt 256.0 lb

## 2022-01-15 DIAGNOSIS — E559 Vitamin D deficiency, unspecified: Secondary | ICD-10-CM | POA: Diagnosis not present

## 2022-01-15 DIAGNOSIS — E66813 Obesity, class 3: Secondary | ICD-10-CM | POA: Insufficient documentation

## 2022-01-15 DIAGNOSIS — Z7985 Long-term (current) use of injectable non-insulin antidiabetic drugs: Secondary | ICD-10-CM | POA: Diagnosis not present

## 2022-01-15 DIAGNOSIS — E1169 Type 2 diabetes mellitus with other specified complication: Secondary | ICD-10-CM | POA: Insufficient documentation

## 2022-01-15 DIAGNOSIS — E669 Obesity, unspecified: Secondary | ICD-10-CM

## 2022-01-15 DIAGNOSIS — R5383 Other fatigue: Secondary | ICD-10-CM | POA: Diagnosis not present

## 2022-01-15 DIAGNOSIS — E785 Hyperlipidemia, unspecified: Secondary | ICD-10-CM

## 2022-01-15 DIAGNOSIS — Z6841 Body Mass Index (BMI) 40.0 and over, adult: Secondary | ICD-10-CM

## 2022-01-15 DIAGNOSIS — Z1331 Encounter for screening for depression: Secondary | ICD-10-CM | POA: Diagnosis not present

## 2022-01-15 DIAGNOSIS — R0602 Shortness of breath: Secondary | ICD-10-CM | POA: Diagnosis not present

## 2022-01-15 DIAGNOSIS — Z7984 Long term (current) use of oral hypoglycemic drugs: Secondary | ICD-10-CM

## 2022-01-15 DIAGNOSIS — Z0000 Encounter for general adult medical examination without abnormal findings: Secondary | ICD-10-CM | POA: Insufficient documentation

## 2022-01-16 LAB — VITAMIN D 25 HYDROXY (VIT D DEFICIENCY, FRACTURES): Vit D, 25-Hydroxy: 59.9 ng/mL (ref 30.0–100.0)

## 2022-01-16 LAB — HEMOGLOBIN A1C
Est. average glucose Bld gHb Est-mCnc: 120 mg/dL
Hgb A1c MFr Bld: 5.8 % — ABNORMAL HIGH (ref 4.8–5.6)

## 2022-01-16 LAB — TSH+T4F+T3FREE
Free T4: 1.2 ng/dL (ref 0.82–1.77)
T3, Free: 2.7 pg/mL (ref 2.0–4.4)
TSH: 2.81 u[IU]/mL (ref 0.450–4.500)

## 2022-01-16 LAB — COMPREHENSIVE METABOLIC PANEL
ALT: 13 IU/L (ref 0–32)
AST: 16 IU/L (ref 0–40)
Albumin/Globulin Ratio: 1.5 (ref 1.2–2.2)
Albumin: 3.8 g/dL (ref 3.8–4.8)
Alkaline Phosphatase: 87 IU/L (ref 44–121)
BUN/Creatinine Ratio: 17 (ref 12–28)
BUN: 14 mg/dL (ref 8–27)
Bilirubin Total: 0.3 mg/dL (ref 0.0–1.2)
CO2: 23 mmol/L (ref 20–29)
Calcium: 9.4 mg/dL (ref 8.7–10.3)
Chloride: 102 mmol/L (ref 96–106)
Creatinine, Ser: 0.84 mg/dL (ref 0.57–1.00)
Globulin, Total: 2.6 g/dL (ref 1.5–4.5)
Glucose: 77 mg/dL (ref 70–99)
Potassium: 4.4 mmol/L (ref 3.5–5.2)
Sodium: 143 mmol/L (ref 134–144)
Total Protein: 6.4 g/dL (ref 6.0–8.5)
eGFR: 73 mL/min/{1.73_m2} (ref >59)

## 2022-01-16 LAB — LIPID PANEL WITH LDL/HDL RATIO
Cholesterol, Total: 149 mg/dL (ref 100–199)
HDL: 58 mg/dL (ref >39)
LDL Chol Calc (NIH): 76 mg/dL (ref 0–99)
LDL/HDL Ratio: 1.3 ratio (ref 0.0–3.2)
Triglycerides: 75 mg/dL (ref 0–149)
VLDL Cholesterol Cal: 15 mg/dL (ref 5–40)

## 2022-01-16 LAB — INSULIN, RANDOM: INSULIN: 8.6 u[IU]/mL (ref 2.6–24.9)

## 2022-01-24 NOTE — Progress Notes (Signed)
Chief Complaint:   OBESITY [name] (MR# [id number]) is a 71 y.o. female who presents for evaluation and treatment of obesity and related comorbidities. Current BMI is Body mass index is 45.35 kg/m. [name] has been struggling with her weight for many years and has been unsuccessful in either losing weight, maintaining weight loss, or reaching her healthy weight goal.  [name] had been in the prep program report at the [facility].  [name] is currently in the action stage of change and ready to dedicate time achieving and maintaining a healthier weight. [name] is interested in becoming our patient and working on intensive lifestyle modifications including (but not limited to) diet and exercise for weight loss.  [name]'s habits were reviewed today and are as follows: Her family eats meals together, she thinks her family will eat healthier with her, her desired weight loss is 56 lbs, she has been heavy most of her life, she started gaining weight after having children, her heaviest weight ever was 330 pounds, she skips meals frequently, she frequently eats larger portions than normal, and she struggles with emotional eating.  Depression Screen [name]'s Food and Mood (modified PHQ-9) score was 12.     01/15/2022    8:46 AM  Depression screen PHQ 2/9  Decreased Interest 2  Down, Depressed, Hopeless 1  PHQ - 2 Score 3  Altered sleeping 1  Tired, decreased energy 2  Change in appetite 2  Feeling bad or failure about yourself  3  Trouble concentrating 0  Moving slowly or fidgety/restless 1  Suicidal thoughts 0  PHQ-9 Score 12  Difficult doing work/chores Somewhat difficult   Subjective:   1. Other fatigue [name] admits to daytime somnolence and denies waking up still tired. Patient has a history of symptoms of daytime fatigue. [name] generally gets 5 hours of sleep per night, and states that she has generally restful sleep. Snoring is present. Apneic episodes are not present. Epworth Sleepiness Score  is 6.   2. SOB (shortness of breath) on exertion [name] notes increasing shortness of breath with exercising and seems to be worsening over time with weight gain. She notes getting out of breath sooner with activity than she used to. This has not gotten worse recently. [name] denies shortness of breath at rest or orthopnea.  3. Diabetes mellitus type 2 in obese (HCC) [name] is taking metformin and Ozempic, and she denies side effects with Ozempic.  Last A1c was 6.6, and fasting blood sugars range between 90-100's.  4. Hyperlipidemia associated with type 2 diabetes mellitus (HCC) [name] is taking atorvastatin.  5. Vitamin D deficiency [name] is taking multivitamins and vitamin D.  6. Health care maintenance Given obesity with comorbidity.  Assessment/Plan:   1. Other fatigue [name] does feel that her weight is causing her energy to be lower than it should be. Fatigue may be related to obesity, depression or many other causes. Labs will be ordered, and in the meanwhile, [name] will focus on self care including making healthy food choices, increasing physical activity and focusing on stress reduction.  - EKG 12-Lead - TSH+T4F+T3Free  2. SOB (shortness of breath) on exertion [name] does feel that she gets out of breath more easily that she used to when she exercises. [name]'s shortness of breath appears to be obesity related and exercise induced. She has agreed to work on weight loss and gradually increase exercise to treat her exercise induced shortness of breath. Will continue to monitor closely.  - TSH+T4F+T3Free  3. Diabetes mellitus type 2 in obese [name]) We will check labs today.  [name] will continue her medications as directed.  - Comprehensive metabolic panel  4. Hyperlipidemia associated with type 2 diabetes mellitus (HCC) We will check labs today.  [name] will continue her medications as directed.  - Lipid Panel With LDL/HDL Ratio  5. Vitamin D deficiency We will check labs today.  [name] will  continue her multivitamins and vitamin D as directed.  - VITAMIN D 25 Hydroxy (Vit-D Deficiency, Fractures)  6. Health care maintenance We will check labs today.  EKG and IC were done and reviewed with the patient today.  - Comprehensive metabolic panel - Lipid Panel With LDL/HDL Ratio - Insulin, random - Hemoglobin A1c - TSH+T4F+T3Free  7. Depression screening [name] had a positive depression screening. Depression is commonly associated with obesity and often results in emotional eating behaviors. We will monitor this closely and work on CBT to help improve the non-hunger eating patterns. Referral to Psychology may be required if no improvement is seen as she continues in our clinic.  8. Obesity, Current BMI 45.4 [name] is currently in the action stage of change and her goal is to continue with weight loss efforts. I recommend [name] begin the structured treatment plan as follows:  She has agreed to the Category 1 Plan.  Meal planning and intentional eating were discussed.  Exercise goals: Start swimming at [facility].  Behavioral modification strategies: increasing lean protein intake, decreasing simple carbohydrates, increasing vegetables, increasing water intake, decreasing eating out, no skipping meals, meal planning and cooking strategies, keeping healthy foods in the home, and planning for success.  She was informed of the importance of frequent follow-up visits to maximize her success with intensive lifestyle modifications for her multiple health conditions. She was informed we would discuss her lab results at her next visit unless there is a critical issue that needs to be addressed sooner. [name] agreed to keep her next visit at the agreed upon time to discuss these results.  Objective:   Blood pressure 117/75, pulse 81, temperature 98 F (36.7 C), height 5\' 3"  (1.6 m), weight 256 lb (116.1 kg), SpO2 97 %. Body mass index is 45.35 kg/m.  EKG: Normal sinus rhythm, rate 81  BPM.  Indirect Calorimeter completed today shows a VO2 of 214 and a REE of 1469.  Her calculated basal metabolic rate is thus her basal metabolic rate is worse than expected.  General: Cooperative, alert, well developed, in no acute distress. HEENT: Conjunctivae and lids unremarkable. Cardiovascular: Regular rhythm.  Lungs: Normal work of breathing. Neurologic: No focal deficits.   Lab Results  Component Value Date   CREATININE 0.84 01/15/2022   BUN 14 01/15/2022   NA 143 01/15/2022   K 4.4 01/15/2022   CL 102 01/15/2022   CO2 23 01/15/2022   Lab Results  Component Value Date   ALT 13 01/15/2022   AST 16 01/15/2022   ALKPHOS 87 01/15/2022   BILITOT 0.3 01/15/2022   Lab Results  Component Value Date   HGBA1C 5.8 (H) 01/15/2022   HGBA1C 6.6 (A) 08/08/2014   HGBA1C 6.1 06/26/2012   HGBA1C 6.1 04/01/2012   HGBA1C 6.6 12/09/2011   Lab Results  Component Value Date   INSULIN 8.6 01/15/2022   Lab Results  Component Value Date   TSH 2.810 01/15/2022   Lab Results  Component Value Date   CHOL 149 01/15/2022   HDL 58 01/15/2022   LDLCALC 76 01/15/2022   LDLDIRECT  152 (H) 11/28/2009   TRIG 75 01/15/2022   CHOLHDL 3.3 12/09/2011   Lab Results  Component Value Date   WBC 6.6 03/30/2014   HGB 13.8 03/30/2014   HCT 42 03/30/2014   MCV 87.4 02/25/2011   PLT 257 03/30/2014   No results found for: "IRON", "TIBC", "FERRITIN"  Attestation Statements:   Reviewed by clinician on day of visit: allergies, medications, problem list, medical history, surgical history, family history, social history, and previous encounter notes.   Trude Mcburney, am acting as Energy manager for [employer], DO.  I have reviewed the above documentation for accuracy and completeness, and I agree with the above. Corinna Capra, DO

## 2022-01-25 DIAGNOSIS — M5136 Other intervertebral disc degeneration, lumbar region: Secondary | ICD-10-CM | POA: Diagnosis not present

## 2022-01-25 DIAGNOSIS — M5416 Radiculopathy, lumbar region: Secondary | ICD-10-CM | POA: Diagnosis not present

## 2022-01-29 ENCOUNTER — Ambulatory Visit (INDEPENDENT_AMBULATORY_CARE_PROVIDER_SITE_OTHER): Payer: Medicare PPO | Admitting: Bariatrics

## 2022-01-29 ENCOUNTER — Encounter (INDEPENDENT_AMBULATORY_CARE_PROVIDER_SITE_OTHER): Payer: Self-pay | Admitting: Bariatrics

## 2022-01-29 VITALS — BP 107/70 | HR 86 | Temp 97.8°F | Ht 63.0 in | Wt 246.0 lb

## 2022-01-29 DIAGNOSIS — E669 Obesity, unspecified: Secondary | ICD-10-CM

## 2022-01-29 DIAGNOSIS — Z7985 Long-term (current) use of injectable non-insulin antidiabetic drugs: Secondary | ICD-10-CM

## 2022-01-29 DIAGNOSIS — E785 Hyperlipidemia, unspecified: Secondary | ICD-10-CM

## 2022-01-29 DIAGNOSIS — E1169 Type 2 diabetes mellitus with other specified complication: Secondary | ICD-10-CM | POA: Diagnosis not present

## 2022-01-29 DIAGNOSIS — Z6841 Body Mass Index (BMI) 40.0 and over, adult: Secondary | ICD-10-CM

## 2022-01-29 DIAGNOSIS — Z7984 Long term (current) use of oral hypoglycemic drugs: Secondary | ICD-10-CM | POA: Diagnosis not present

## 2022-01-30 ENCOUNTER — Encounter (INDEPENDENT_AMBULATORY_CARE_PROVIDER_SITE_OTHER): Payer: Self-pay | Admitting: Bariatrics

## 2022-01-30 NOTE — Progress Notes (Unsigned)
Chief Complaint:   OBESITY [name] is here to discuss her progress with her obesity treatment plan along with follow-up of her obesity related diagnoses. [name] is on the Category 1 Plan and states she is following her eating plan approximately 75% of the time. Lyrick states she is swimming and doing strength training 60 minutes 3-4 times per week.  Today's visit was #: 2 Starting weight: 256 lbs Starting date: 01/15/2022 Today's weight: 246 lbs Today's date: 01/29/2022 Total lbs lost to date: 10 lbs Total lbs lost since last in-office visit: 10 lb  Interim History: [name] is down 10 lbs since her first visit. She started the meal plan a little late. She denies any obstacles.   Subjective:   1. Diabetes mellitus type 2 in obese [facility]) [name] is taking Metformin and Ozempic. Her levels are well controled. Patient last A1c was 5.8.  2. Hyperlipidemia associated with type 2 diabetes mellitus (HCC) [name] is taking Lipitor. Her levels are well controled.  Assessment/Plan:   1. Diabetes mellitus type 2 in obese [name] [facility]) [name] will continue Metformin and Ozempic as prescribed.  2. Hyperlipidemia associated with type 2 diabetes mellitus (HCC) [name] will continue Lipitor as prescribed.   3. Obesity, Current BMI 43.6 [name] is currently in the action stage of change. As such, her goal is to continue with weight loss efforts. She has agreed to the Category 1 Plan.   [name] will continue the meal plan at 80-90%. Reviewed labs with patient from 01/15/2022, BMP, Lipid, Vitamin D, A1c, Insulin and Thyroid panel. Eating Out guide was given to the patient.  Exercise goals: As is.  Behavioral modification strategies: increasing lean protein intake, decreasing simple carbohydrates, increasing vegetables, increasing water intake, decreasing eating out, no skipping meals, meal planning and cooking strategies, keeping healthy foods in the home, and planning for success.  [name] has agreed to follow-up with our  clinic in 4 weeks. She was informed of the importance of frequent follow-up visits to maximize her success with intensive lifestyle modifications for her multiple health conditions.   Objective:   Blood pressure 107/70, pulse 86, temperature 97.8 F (36.6 C), height 5\' 3"  (1.6 m), weight 246 lb (111.6 kg), SpO2 97 %. Body mass index is 43.58 kg/m.  [name] is using a walker for ambulation.   General: Cooperative, alert, well developed, in no acute distress. HEENT: Conjunctivae and lids unremarkable. Cardiovascular: Regular rhythm.  Lungs: Normal work of breathing. Neurologic: No focal deficits.   Lab Results  Component Value Date   CREATININE 0.84 01/15/2022   BUN 14 01/15/2022   NA 143 01/15/2022   [name] 4.4 01/15/2022   CL 102 01/15/2022   CO2 23 01/15/2022   Lab Results  Component Value Date   ALT 13 01/15/2022   AST 16 01/15/2022   ALKPHOS 87 01/15/2022   BILITOT 0.3 01/15/2022   Lab Results  Component Value Date   HGBA1C 5.8 (H) 01/15/2022   HGBA1C 6.6 (A) 08/08/2014   HGBA1C 6.1 06/26/2012   HGBA1C 6.1 04/01/2012   HGBA1C 6.6 12/09/2011   Lab Results  Component Value Date   INSULIN 8.6 01/15/2022   Lab Results  Component Value Date   TSH 2.810 01/15/2022   Lab Results  Component Value Date   CHOL 149 01/15/2022   HDL 58 01/15/2022   LDLCALC 76 01/15/2022   LDLDIRECT 152 (H) 11/28/2009   TRIG 75 01/15/2022   CHOLHDL 3.3 12/09/2011   Lab Results  Component Value Date  VD25OH 59.9 01/15/2022   Lab Results  Component Value Date   WBC 6.6 03/30/2014   HGB 13.8 03/30/2014   HCT 42 03/30/2014   MCV 87.4 02/25/2011   PLT 257 03/30/2014   No results found for: "IRON", "TIBC", "FERRITIN"  Attestation Statements:   Reviewed by clinician on day of visit: allergies, medications, problem list, medical history, surgical history, family history, social history, and previous encounter notes.   Micheline Rough, am acting as Energy manager for [employer],  DO.  I have reviewed the above documentation for accuracy and completeness, and I agree with the above. Corinna Capra, DO

## 2022-01-31 ENCOUNTER — Encounter (INDEPENDENT_AMBULATORY_CARE_PROVIDER_SITE_OTHER): Payer: Self-pay | Admitting: Bariatrics

## 2022-02-19 DIAGNOSIS — E1169 Type 2 diabetes mellitus with other specified complication: Secondary | ICD-10-CM | POA: Diagnosis not present

## 2022-02-19 DIAGNOSIS — M810 Age-related osteoporosis without current pathological fracture: Secondary | ICD-10-CM | POA: Diagnosis not present

## 2022-02-19 DIAGNOSIS — E785 Hyperlipidemia, unspecified: Secondary | ICD-10-CM | POA: Diagnosis not present

## 2022-02-19 DIAGNOSIS — Z23 Encounter for immunization: Secondary | ICD-10-CM | POA: Diagnosis not present

## 2022-02-25 DIAGNOSIS — M5136 Other intervertebral disc degeneration, lumbar region: Secondary | ICD-10-CM | POA: Diagnosis not present

## 2022-02-26 ENCOUNTER — Ambulatory Visit: Payer: Medicare PPO | Admitting: Bariatrics

## 2022-02-28 ENCOUNTER — Ambulatory Visit: Payer: Medicare PPO | Admitting: Bariatrics

## 2022-02-28 ENCOUNTER — Encounter (INDEPENDENT_AMBULATORY_CARE_PROVIDER_SITE_OTHER): Payer: Self-pay | Admitting: Family Medicine

## 2022-02-28 ENCOUNTER — Ambulatory Visit (INDEPENDENT_AMBULATORY_CARE_PROVIDER_SITE_OTHER): Payer: Medicare PPO | Admitting: Family Medicine

## 2022-02-28 VITALS — BP 109/71 | HR 76 | Temp 98.4°F | Ht 63.0 in | Wt 249.0 lb

## 2022-02-28 DIAGNOSIS — E1169 Type 2 diabetes mellitus with other specified complication: Secondary | ICD-10-CM

## 2022-02-28 DIAGNOSIS — Z7984 Long term (current) use of oral hypoglycemic drugs: Secondary | ICD-10-CM

## 2022-02-28 DIAGNOSIS — Z7985 Long-term (current) use of injectable non-insulin antidiabetic drugs: Secondary | ICD-10-CM | POA: Diagnosis not present

## 2022-02-28 DIAGNOSIS — E669 Obesity, unspecified: Secondary | ICD-10-CM | POA: Diagnosis not present

## 2022-02-28 DIAGNOSIS — Z6841 Body Mass Index (BMI) 40.0 and over, adult: Secondary | ICD-10-CM

## 2022-03-04 NOTE — Progress Notes (Unsigned)
Chief Complaint:   OBESITY [name] is here to discuss her progress with her obesity treatment plan along with follow-up of her obesity related diagnoses. [name] is on the Category 1 Plan and states she is following her eating plan approximately 50% of the time. [name] states she is at the [facility] doing water exercises 90 minutes 4 times per week.  Today's visit was #: 3 Starting weight: 256 lbs Starting date: 01/15/2022 Today's weight: 249 lbs Today's date: 02/28/2022 Total lbs lost to date: 7 lbs Total lbs lost since last in-office visit: +3 lbs  Interim History: [name] admits to getting off meal plan and eating out more, but watching her portions.  She has been cooking at home and her daughter is supportive.  Trying not to skip meals.  She has been drinking diet soda and tends to skip lunch.  She has been eating dinner late.  Eating little fruits and some veggies.  She likes protein with meals.  She craves sweets but does not give in much, she will have some tootsie pops.    Subjective:   1. Type 2 diabetes mellitus with other specified complication, unspecified whether long term insulin use ([place]) She is on Ozempic 0.5 mg weekly and metformin 500 mg BID per PCP. Her last A1c was 5.8  Assessment/Plan:   1. Type 2 diabetes mellitus with other specified complication, unspecified whether long term insulin use (HCC) Continue current medications with PCP and reduce sugar intake.   2. Obesity,current BMI 44.2 1) Eat all meals. 2) Do not buy any candy. 3) Limit meals out to 2 times per week.   [name] is currently in the action stage of change. As such, her goal is to continue with weight loss efforts. She has agreed to the Category 1 Plan.   Exercise goals:  As is.   Behavioral modification strategies: increasing lean protein intake, increasing vegetables, increasing water intake, decreasing eating out, no skipping meals, keeping healthy foods in the home, better snacking choices, and  decreasing junk food.  [name] has agreed to follow-up with our clinic in 3-4 weeks. She was informed of the importance of frequent follow-up visits to maximize her success with intensive lifestyle modifications for her multiple health conditions.   Objective:   Blood pressure 109/71, pulse 76, temperature 98.4 F (36.9 C), height 5\' 3"  (1.6 m), weight 249 lb (112.9 kg), SpO2 100 %. Body mass index is 44.11 kg/m.  General: Cooperative, alert, well developed, in no acute distress. HEENT: Conjunctivae and lids unremarkable. Cardiovascular: Regular rhythm.  Lungs: Normal work of breathing. Neurologic: No focal deficits.   Lab Results  Component Value Date   CREATININE 0.84 01/15/2022   BUN 14 01/15/2022   NA 143 01/15/2022   K 4.4 01/15/2022   CL 102 01/15/2022   CO2 23 01/15/2022   Lab Results  Component Value Date   ALT 13 01/15/2022   AST 16 01/15/2022   ALKPHOS 87 01/15/2022   BILITOT 0.3 01/15/2022   Lab Results  Component Value Date   HGBA1C 5.8 (H) 01/15/2022   HGBA1C 6.6 (A) 08/08/2014   HGBA1C 6.1 06/26/2012   HGBA1C 6.1 04/01/2012   HGBA1C 6.6 12/09/2011   Lab Results  Component Value Date   INSULIN 8.6 01/15/2022   Lab Results  Component Value Date   TSH 2.810 01/15/2022   Lab Results  Component Value Date   CHOL 149 01/15/2022   HDL 58 01/15/2022   LDLCALC 76 01/15/2022   LDLDIRECT  152 (H) 11/28/2009   TRIG 75 01/15/2022   CHOLHDL 3.3 12/09/2011   Lab Results  Component Value Date   VD25OH 59.9 01/15/2022   Lab Results  Component Value Date   WBC 6.6 03/30/2014   HGB 13.8 03/30/2014   HCT 42 03/30/2014   MCV 87.4 02/25/2011   PLT 257 03/30/2014   No results found for: "IRON", "TIBC", "FERRITIN"  Obesity Behavioral Intervention:   Approximately 15 minutes were spent on the discussion below.  ASK: We discussed the diagnosis of obesity with [name] today and [name] agreed to give [place] permission to discuss obesity behavioral modification  therapy today.  ASSESS: [name] has the diagnosis of obesity and her BMI today is 44.2. [name] is in the action stage of change.   ADVISE: [name] was educated on the multiple health risks of obesity as well as the benefit of weight loss to improve her health. She was advised of the need for long term treatment and the importance of lifestyle modifications to improve her current health and to decrease her risk of future health problems.  AGREE: Multiple dietary modification options and treatment options were discussed and [name] agreed to follow the recommendations documented in the above note.  ARRANGE: [name] was educated on the importance of frequent visits to treat obesity as outlined per CMS and USPSTF guidelines and agreed to schedule her next follow up appointment today.  Attestation Statements:   Reviewed by clinician on day of visit: allergies, medications, problem list, medical history, surgical history, family history, social history, and previous encounter notes.  Time spent on visit including pre-visit chart review and post-visit care and charting was 30 minutes.   I, Malcolm Metro, RMA, am acting as Energy manager for Seymour Bars, DO.  I have reviewed the above documentation for accuracy and completeness, and I agree with the above. -  ***

## 2022-04-02 ENCOUNTER — Ambulatory Visit (INDEPENDENT_AMBULATORY_CARE_PROVIDER_SITE_OTHER): Payer: Medicare PPO | Admitting: Family Medicine

## 2022-04-02 ENCOUNTER — Encounter (INDEPENDENT_AMBULATORY_CARE_PROVIDER_SITE_OTHER): Payer: Self-pay | Admitting: Family Medicine

## 2022-04-02 VITALS — BP 129/79 | HR 93 | Temp 98.5°F | Ht 63.0 in | Wt 242.0 lb

## 2022-04-02 DIAGNOSIS — Z7985 Long-term (current) use of injectable non-insulin antidiabetic drugs: Secondary | ICD-10-CM

## 2022-04-02 DIAGNOSIS — Z6841 Body Mass Index (BMI) 40.0 and over, adult: Secondary | ICD-10-CM | POA: Diagnosis not present

## 2022-04-02 DIAGNOSIS — E669 Obesity, unspecified: Secondary | ICD-10-CM

## 2022-04-02 DIAGNOSIS — E7849 Other hyperlipidemia: Secondary | ICD-10-CM | POA: Diagnosis not present

## 2022-04-02 DIAGNOSIS — E1169 Type 2 diabetes mellitus with other specified complication: Secondary | ICD-10-CM

## 2022-04-02 DIAGNOSIS — Z7984 Long term (current) use of oral hypoglycemic drugs: Secondary | ICD-10-CM

## 2022-04-03 DIAGNOSIS — E7849 Other hyperlipidemia: Secondary | ICD-10-CM | POA: Insufficient documentation

## 2022-04-15 NOTE — Progress Notes (Unsigned)
Chief Complaint:   OBESITY [name] is here to discuss her progress with her obesity treatment plan along with follow-up of her obesity related diagnoses. [name] is on the Category 1 Plan and states she is following her eating plan approximately 75% of the time. [name] states she is swimming and working out at [employer] 60 minutes 4 times per week.  Today's visit was #: 4 Starting weight: 256 lbs Starting date: 01/15/2022 Today's weight: 242 lbs Today's date: 04/02/2022 Total lbs lost to date: 14 lbs Total lbs lost since last in-office visit: 7 lbs  Interim History: Has had dental work done this morning. Chewing on the left side is limited.  Has some sugar cravings.  Limits meal out.  Enjoys working out at [employer] 4 times per week.   Subjective:   1. Type 2 diabetes mellitus with other specified complication, unspecified whether long term insulin use (HCC) Last A1c 5.8 on Ozempic 0.5 mg (increased dose 2 weeks ago) and metformin 500 mg BID.  Denies GI upset.  Sees some improved satiety.   2. Other hyperlipidemia She is on Lipitor 20 mg QHS.  She denies myalgias.   Assessment/Plan:   1. Type 2 diabetes mellitus with other specified complication, unspecified whether long term insulin use (HCC) Recommend reducing sugar intake. Continue current medications per PCP.   2. Other hyperlipidemia Continue a heart health diet with a 5-10% total body weight loss goal.   3. Obesity,current BMI 42.9 Continue Category 1 meal plan.  Recipes handout given.   [name] is currently in the action stage of change. As such, her goal is to continue with weight loss efforts. She has agreed to the Category 1 Plan.   Exercise goals:  As is.   Behavioral modification strategies: increasing lean protein intake, increasing vegetables, increasing water intake, decreasing eating out, no skipping meals, meal planning and cooking strategies, keeping healthy foods in the home, planning for success, and decreasing  junk food.  [name] has agreed to follow-up with our clinic in 3 weeks. She was informed of the importance of frequent follow-up visits to maximize her success with intensive lifestyle modifications for her multiple health conditions.   Objective:   Blood pressure 129/79, pulse 93, temperature 98.5 F (36.9 C), height 5\' 3"  (1.6 m), weight 242 lb (109.8 kg), SpO2 100 %. Body mass index is 42.87 kg/m.  General: Cooperative, alert, well developed, in no acute distress. HEENT: Conjunctivae and lids unremarkable. Cardiovascular: Regular rhythm.  Lungs: Normal work of breathing. Neurologic: No focal deficits.   Lab Results  Component Value Date   CREATININE 0.84 01/15/2022   BUN 14 01/15/2022   NA 143 01/15/2022   K 4.4 01/15/2022   CL 102 01/15/2022   CO2 23 01/15/2022   Lab Results  Component Value Date   ALT 13 01/15/2022   AST 16 01/15/2022   ALKPHOS 87 01/15/2022   BILITOT 0.3 01/15/2022   Lab Results  Component Value Date   HGBA1C 5.8 (H) 01/15/2022   HGBA1C 6.6 (A) 08/08/2014   HGBA1C 6.1 06/26/2012   HGBA1C 6.1 04/01/2012   HGBA1C 6.6 12/09/2011   Lab Results  Component Value Date   INSULIN 8.6 01/15/2022   Lab Results  Component Value Date   TSH 2.810 01/15/2022   Lab Results  Component Value Date   CHOL 149 01/15/2022   HDL 58 01/15/2022   LDLCALC 76 01/15/2022   LDLDIRECT 152 (H) 11/28/2009   TRIG 75 01/15/2022  CHOLHDL 3.3 12/09/2011   Lab Results  Component Value Date   VD25OH 59.9 01/15/2022   Lab Results  Component Value Date   WBC 6.6 03/30/2014   HGB 13.8 03/30/2014   HCT 42 03/30/2014   MCV 87.4 02/25/2011   PLT 257 03/30/2014   No results found for: "IRON", "TIBC", "FERRITIN"  Attestation Statements:   Reviewed by clinician on day of visit: allergies, medications, problem list, medical history, surgical history, family history, social history, and previous encounter notes.  I, Malcolm Metro, am acting as Energy manager for Seymour Bars, DO.  I have reviewed the above documentation for accuracy and completeness, and I agree with the above. Glennis Brink, DO

## 2022-04-30 ENCOUNTER — Encounter (INDEPENDENT_AMBULATORY_CARE_PROVIDER_SITE_OTHER): Payer: Self-pay | Admitting: Family Medicine

## 2022-04-30 ENCOUNTER — Ambulatory Visit (INDEPENDENT_AMBULATORY_CARE_PROVIDER_SITE_OTHER): Payer: Medicare PPO | Admitting: Family Medicine

## 2022-04-30 VITALS — BP 122/74 | HR 71 | Temp 97.9°F | Ht 63.0 in | Wt 246.0 lb

## 2022-04-30 DIAGNOSIS — Z6841 Body Mass Index (BMI) 40.0 and over, adult: Secondary | ICD-10-CM

## 2022-04-30 DIAGNOSIS — Z7985 Long-term (current) use of injectable non-insulin antidiabetic drugs: Secondary | ICD-10-CM | POA: Diagnosis not present

## 2022-04-30 DIAGNOSIS — E1169 Type 2 diabetes mellitus with other specified complication: Secondary | ICD-10-CM

## 2022-04-30 DIAGNOSIS — E669 Obesity, unspecified: Secondary | ICD-10-CM

## 2022-04-30 DIAGNOSIS — M170 Bilateral primary osteoarthritis of knee: Secondary | ICD-10-CM | POA: Insufficient documentation

## 2022-05-09 NOTE — Progress Notes (Signed)
Chief Complaint:   OBESITY [name] is here to discuss her progress with her obesity treatment plan along with follow-up of her obesity related diagnoses. [name] is on the Category 1 Plan and states she is following her eating plan approximately 75% of the time. [name] states she is not exercising.  Today's visit was # 5 Starting weight: 256 LBS Starting date: 01/15/2022 Today's weight: 246 LBS Today's date: 04/30/2022 Total lbs lost to date: 10 LBS Total lbs lost since last in-office visit: +4 LBS  Interim History: [name] traveled to [place].  She went to [place] for 4 days with family and she uses a scooter.  She ate out a lot.  She is back on track now.  She had another oral surgery done which limited chewing.  She plans to get back on track at the [facility] pool for 5 times per week.  She does some resistance bands and free weights at home.  Subjective:   1. Type 2 diabetes mellitus with other specified complication, without long-term current use of insulin (HCC) [name] is taking Ozempic 0.5 mg every week, and metformin 500 mg twice daily per her PCP.  She denies adverse side effects on Ozempic.  Her last A1c was 5.8.  2. Osteoarthritis of both knees, unspecified osteoarthritis type S/P total knee replacement x 1.  Patient's walking is limited more from back pains and knee pain.  She is using a rolling walker.  Assessment/Plan:   1. Type 2 diabetes mellitus with other specified complication, without long-term current use of insulin (HCC) Patient hopes to come off metformin with further weight loss.  2. Osteoarthritis of both knees, unspecified osteoarthritis type Continue to work actively on weight loss.  3. Obesity,current BMI 43.6 1) low protein intake using a calorie [place] log sheets given. 2) glucose log sheets given.  [name] is currently in the action stage of change. As such, her goal is to continue with weight loss efforts. She has agreed to the Category 1 Plan +90 protein  daily.  Exercise goals:  Patient has started resistance bands at home.  Behavioral modification strategies: increasing lean protein intake, increasing vegetables, increasing water intake, decreasing eating out, no skipping meals, meal planning and cooking strategies, keeping healthy foods in the home, and avoiding temptations.  [name] has agreed to follow-up with our clinic in 3 weeks. She was informed of the importance of frequent follow-up visits to maximize her success with intensive lifestyle modifications for her multiple health conditions.   Objective:   Blood pressure 122/74, pulse 71, temperature 97.9 F (36.6 C), height 5\' 3"  (1.6 m), weight 246 lb (111.6 kg), SpO2 96 %. Body mass index is 43.58 kg/m.  General: Cooperative, alert, well developed, in no acute distress. HEENT: Conjunctivae and lids unremarkable. Cardiovascular: Regular rhythm.  Lungs: Normal work of breathing. Neurologic: No focal deficits.   Lab Results  Component Value Date   CREATININE 0.84 01/15/2022   BUN 14 01/15/2022   NA 143 01/15/2022   K 4.4 01/15/2022   CL 102 01/15/2022   CO2 23 01/15/2022   Lab Results  Component Value Date   ALT 13 01/15/2022   AST 16 01/15/2022   ALKPHOS 87 01/15/2022   BILITOT 0.3 01/15/2022   Lab Results  Component Value Date   HGBA1C 5.8 (H) 01/15/2022   HGBA1C 6.6 (A) 08/08/2014   HGBA1C 6.1 06/26/2012   HGBA1C 6.1 04/01/2012   HGBA1C 6.6 12/09/2011   Lab Results  Component Value Date  INSULIN 8.6 01/15/2022   Lab Results  Component Value Date   TSH 2.810 01/15/2022   Lab Results  Component Value Date   CHOL 149 01/15/2022   HDL 58 01/15/2022   LDLCALC 76 01/15/2022   LDLDIRECT 152 (H) 11/28/2009   TRIG 75 01/15/2022   CHOLHDL 3.3 12/09/2011   Lab Results  Component Value Date   VD25OH 59.9 01/15/2022   Lab Results  Component Value Date   WBC 6.6 03/30/2014   HGB 13.8 03/30/2014   HCT 42 03/30/2014   MCV 87.4 02/25/2011   PLT 257  03/30/2014   No results found for: "IRON", "TIBC", "FERRITIN"  Attestation Statements:   Reviewed by clinician on day of visit: allergies, medications, problem list, medical history, surgical history, family history, social history, and previous encounter notes.  I, Davy Pique, am acting as Location manager for Loyal Gambler, DO.  I have reviewed the above documentation for accuracy and completeness, and I agree with the above. Dell Ponto, DO

## 2022-05-21 ENCOUNTER — Ambulatory Visit (INDEPENDENT_AMBULATORY_CARE_PROVIDER_SITE_OTHER): Payer: Medicare PPO | Admitting: Family Medicine

## 2022-06-11 ENCOUNTER — Ambulatory Visit (INDEPENDENT_AMBULATORY_CARE_PROVIDER_SITE_OTHER): Payer: Medicare PPO | Admitting: Family Medicine

## 2022-06-11 ENCOUNTER — Encounter (INDEPENDENT_AMBULATORY_CARE_PROVIDER_SITE_OTHER): Payer: Self-pay | Admitting: Family Medicine

## 2022-06-11 VITALS — BP 127/74 | HR 79 | Temp 97.9°F | Ht 63.0 in | Wt 252.0 lb

## 2022-06-11 DIAGNOSIS — Z6841 Body Mass Index (BMI) 40.0 and over, adult: Secondary | ICD-10-CM

## 2022-06-11 DIAGNOSIS — F439 Reaction to severe stress, unspecified: Secondary | ICD-10-CM | POA: Diagnosis not present

## 2022-06-11 DIAGNOSIS — Z7985 Long-term (current) use of injectable non-insulin antidiabetic drugs: Secondary | ICD-10-CM | POA: Diagnosis not present

## 2022-06-11 DIAGNOSIS — E1169 Type 2 diabetes mellitus with other specified complication: Secondary | ICD-10-CM

## 2022-06-11 DIAGNOSIS — Z7984 Long term (current) use of oral hypoglycemic drugs: Secondary | ICD-10-CM | POA: Diagnosis not present

## 2022-06-11 DIAGNOSIS — E669 Obesity, unspecified: Secondary | ICD-10-CM | POA: Diagnosis not present

## 2022-06-27 ENCOUNTER — Other Ambulatory Visit: Payer: Self-pay | Admitting: Family Medicine

## 2022-06-27 DIAGNOSIS — Z1231 Encounter for screening mammogram for malignant neoplasm of breast: Secondary | ICD-10-CM

## 2022-07-01 NOTE — Progress Notes (Unsigned)
Chief Complaint:   OBESITY [name] is here to discuss her progress with her obesity treatment plan along with follow-up of her obesity related diagnoses. [name] is on the Category 1 Plan and states she is following her eating plan approximately 50% of the time. [name] states she has not been exercising.  Today's visit was #: 6 Starting weight: 256 lbs Starting date: 01/15/22 Today's weight: 252 lbs Today's date: 06/11/22 Total lbs lost to date: 4 Total lbs lost since last in-office visit: +6  Interim History: Stress levels hide-Sister has cancer and with the holidays eating out more. Net weight loss 4 pounds and 4.5 months of medically supervised weight management. Gained 6 pounds of body fat in the past month.  Subjective:   1. Type 2 diabetes mellitus with other specified complication, unspecified whether long term insulin use (HCC) Taking Ozempic 0.5 mg subcu weekly injection and metformin 500 mg twice daily.  Denies GI upset.  2. Stress Going through stress with her sister stage IV rectal cancer.  Has her other sisters for support.  Assessment/Plan:   1. Type 2 diabetes mellitus with other specified complication, unspecified whether long term insulin use ([place]) Has follow-up with PCP this month.  Has room to increase Ozempic 1 mg dose.  2. Stress 1.  Work on leaning on family for support. 2.  Aim for 8 hours of sleep at night. 3.  Eat 3 meals per day. 4.  Keep junk food out of the house.  3. Obesity,current BMI 44.7 1. Agrees to taking the next 3 months to help out her sister who is under hospice care.  [name] is currently in the action stage of change. As such, her goal is to continue with weight loss efforts. She has agreed to the Category 1 Plan.   Exercise goals: No exercise has been prescribed at this time.  Behavioral modification strategies: increasing lean protein intake, increasing water intake, increasing high fiber foods, decreasing eating out, keeping healthy  foods in the home, emotional eating strategies, and planning for success.  [name] has agreed to follow-up with our clinic in 3 months.  She was informed of the importance of frequent follow-up visits to maximize her success with intensive lifestyle modifications for her multiple health conditions.    Objective:   Blood pressure 127/74, pulse 79, temperature 97.9 F (36.6 C), height 5\' 3"  (1.6 m), weight 252 lb (114.3 kg), SpO2 99 %. Body mass index is 44.64 kg/m.  General: Cooperative, alert, well developed, in no acute distress. HEENT: Conjunctivae and lids unremarkable. Cardiovascular: Regular rhythm.  Lungs: Normal work of breathing. Neurologic: No focal deficits.   Lab Results  Component Value Date   CREATININE 0.84 01/15/2022   BUN 14 01/15/2022   NA 143 01/15/2022   K 4.4 01/15/2022   CL 102 01/15/2022   CO2 23 01/15/2022   Lab Results  Component Value Date   ALT 13 01/15/2022   AST 16 01/15/2022   ALKPHOS 87 01/15/2022   BILITOT 0.3 01/15/2022   Lab Results  Component Value Date   HGBA1C 5.8 (H) 01/15/2022   HGBA1C 6.6 (A) 08/08/2014   HGBA1C 6.1 06/26/2012   HGBA1C 6.1 04/01/2012   HGBA1C 6.6 12/09/2011   Lab Results  Component Value Date   INSULIN 8.6 01/15/2022   Lab Results  Component Value Date   TSH 2.810 01/15/2022   Lab Results  Component Value Date   CHOL 149 01/15/2022   HDL 58 01/15/2022   [place]  76 01/15/2022   LDLDIRECT 152 (H) 11/28/2009   TRIG 75 01/15/2022   CHOLHDL 3.3 12/09/2011   Lab Results  Component Value Date   VD25OH 59.9 01/15/2022   Lab Results  Component Value Date   WBC 6.6 03/30/2014   HGB 13.8 03/30/2014   HCT 42 03/30/2014   MCV 87.4 02/25/2011   PLT 257 03/30/2014   No results found for: "IRON", "TIBC", "FERRITIN"   Attestation Statements:   Reviewed by clinician on day of visit: allergies, medications, problem list, medical history, surgical history, family history, social history, and previous  encounter notes.  ***(delete if time-based billing not used)Time spent on visit including pre-visit chart review and post-visit care and charting was *** minutes.   I, ***, am acting as transcriptionist for ***.  I have reviewed the above documentation for accuracy and completeness, and I agree with the above. -  ***

## 2022-08-16 ENCOUNTER — Ambulatory Visit
Admission: RE | Admit: 2022-08-16 | Discharge: 2022-08-16 | Disposition: A | Discharge status: Home / Self Care | Payer: Medicare PPO | Source: Ambulatory Visit | Attending: Family Medicine | Admitting: Family Medicine

## 2022-08-16 DIAGNOSIS — Z1231 Encounter for screening mammogram for malignant neoplasm of breast: Secondary | ICD-10-CM | POA: Diagnosis not present

## 2022-08-20 ENCOUNTER — Other Ambulatory Visit: Payer: Self-pay | Admitting: Family Medicine

## 2022-08-20 DIAGNOSIS — R928 Other abnormal and inconclusive findings on diagnostic imaging of breast: Secondary | ICD-10-CM

## 2022-08-27 ENCOUNTER — Ambulatory Visit
Admission: RE | Admit: 2022-08-27 | Discharge: 2022-08-27 | Disposition: A | Discharge status: Home / Self Care | Payer: Medicare PPO | Source: Ambulatory Visit | Attending: Family Medicine | Admitting: Family Medicine

## 2022-08-27 DIAGNOSIS — R928 Other abnormal and inconclusive findings on diagnostic imaging of breast: Secondary | ICD-10-CM | POA: Diagnosis not present

## 2022-08-27 DIAGNOSIS — R921 Mammographic calcification found on diagnostic imaging of breast: Secondary | ICD-10-CM | POA: Diagnosis not present

## 2022-08-28 ENCOUNTER — Other Ambulatory Visit: Payer: Self-pay | Admitting: Family Medicine

## 2022-08-28 DIAGNOSIS — R921 Mammographic calcification found on diagnostic imaging of breast: Secondary | ICD-10-CM

## 2022-09-10 ENCOUNTER — Ambulatory Visit
Admission: RE | Admit: 2022-09-10 | Discharge: 2022-09-10 | Disposition: A | Discharge status: Home / Self Care | Payer: Medicare PPO | Source: Ambulatory Visit | Attending: Family Medicine | Admitting: Family Medicine

## 2022-09-10 ENCOUNTER — Ambulatory Visit (INDEPENDENT_AMBULATORY_CARE_PROVIDER_SITE_OTHER): Payer: Medicare PPO | Admitting: Family Medicine

## 2022-09-10 DIAGNOSIS — R921 Mammographic calcification found on diagnostic imaging of breast: Secondary | ICD-10-CM | POA: Diagnosis not present

## 2022-09-10 DIAGNOSIS — N6012 Diffuse cystic mastopathy of left breast: Secondary | ICD-10-CM | POA: Diagnosis not present

## 2022-09-10 HISTORY — PX: BREAST BIOPSY: SHX20

## 2022-09-23 DIAGNOSIS — Z9989 Dependence on other enabling machines and devices: Secondary | ICD-10-CM | POA: Diagnosis not present

## 2022-09-23 DIAGNOSIS — M810 Age-related osteoporosis without current pathological fracture: Secondary | ICD-10-CM | POA: Diagnosis not present

## 2022-09-23 DIAGNOSIS — Z0000 Encounter for general adult medical examination without abnormal findings: Secondary | ICD-10-CM | POA: Diagnosis not present

## 2022-09-23 DIAGNOSIS — E119 Type 2 diabetes mellitus without complications: Secondary | ICD-10-CM | POA: Diagnosis not present

## 2022-09-23 DIAGNOSIS — E1169 Type 2 diabetes mellitus with other specified complication: Secondary | ICD-10-CM | POA: Diagnosis not present

## 2022-09-23 DIAGNOSIS — Z1331 Encounter for screening for depression: Secondary | ICD-10-CM | POA: Diagnosis not present

## 2022-09-23 DIAGNOSIS — Z6841 Body Mass Index (BMI) 40.0 and over, adult: Secondary | ICD-10-CM | POA: Diagnosis not present

## 2022-09-23 DIAGNOSIS — E785 Hyperlipidemia, unspecified: Secondary | ICD-10-CM | POA: Diagnosis not present

## 2022-09-24 ENCOUNTER — Other Ambulatory Visit: Payer: Self-pay | Admitting: Family Medicine

## 2022-09-24 DIAGNOSIS — M810 Age-related osteoporosis without current pathological fracture: Secondary | ICD-10-CM

## 2022-11-12 DIAGNOSIS — E8889 Other specified metabolic disorders: Secondary | ICD-10-CM | POA: Diagnosis not present

## 2022-11-12 DIAGNOSIS — Z1331 Encounter for screening for depression: Secondary | ICD-10-CM | POA: Diagnosis not present

## 2022-11-12 DIAGNOSIS — Z6841 Body Mass Index (BMI) 40.0 and over, adult: Secondary | ICD-10-CM | POA: Diagnosis not present

## 2022-11-12 DIAGNOSIS — E785 Hyperlipidemia, unspecified: Secondary | ICD-10-CM | POA: Diagnosis not present

## 2022-11-12 DIAGNOSIS — R600 Localized edema: Secondary | ICD-10-CM | POA: Diagnosis not present

## 2022-11-12 DIAGNOSIS — E1169 Type 2 diabetes mellitus with other specified complication: Secondary | ICD-10-CM | POA: Diagnosis not present

## 2022-11-26 DIAGNOSIS — R600 Localized edema: Secondary | ICD-10-CM | POA: Diagnosis not present

## 2022-11-26 DIAGNOSIS — E1169 Type 2 diabetes mellitus with other specified complication: Secondary | ICD-10-CM | POA: Diagnosis not present

## 2022-11-26 DIAGNOSIS — E785 Hyperlipidemia, unspecified: Secondary | ICD-10-CM | POA: Diagnosis not present

## 2022-11-26 DIAGNOSIS — Z6841 Body Mass Index (BMI) 40.0 and over, adult: Secondary | ICD-10-CM | POA: Diagnosis not present

## 2022-12-10 DIAGNOSIS — E1169 Type 2 diabetes mellitus with other specified complication: Secondary | ICD-10-CM | POA: Diagnosis not present

## 2022-12-10 DIAGNOSIS — E785 Hyperlipidemia, unspecified: Secondary | ICD-10-CM | POA: Diagnosis not present

## 2022-12-10 DIAGNOSIS — Z6841 Body Mass Index (BMI) 40.0 and over, adult: Secondary | ICD-10-CM | POA: Diagnosis not present

## 2022-12-10 DIAGNOSIS — R600 Localized edema: Secondary | ICD-10-CM | POA: Diagnosis not present

## 2022-12-23 DIAGNOSIS — E119 Type 2 diabetes mellitus without complications: Secondary | ICD-10-CM | POA: Diagnosis not present

## 2022-12-30 DIAGNOSIS — R600 Localized edema: Secondary | ICD-10-CM | POA: Diagnosis not present

## 2022-12-30 DIAGNOSIS — E1169 Type 2 diabetes mellitus with other specified complication: Secondary | ICD-10-CM | POA: Diagnosis not present

## 2022-12-30 DIAGNOSIS — E785 Hyperlipidemia, unspecified: Secondary | ICD-10-CM | POA: Diagnosis not present

## 2022-12-30 DIAGNOSIS — Z6841 Body Mass Index (BMI) 40.0 and over, adult: Secondary | ICD-10-CM | POA: Diagnosis not present

## 2023-01-19 DIAGNOSIS — M5116 Intervertebral disc disorders with radiculopathy, lumbar region: Secondary | ICD-10-CM | POA: Diagnosis not present

## 2023-01-27 DIAGNOSIS — R600 Localized edema: Secondary | ICD-10-CM | POA: Diagnosis not present

## 2023-01-27 DIAGNOSIS — E785 Hyperlipidemia, unspecified: Secondary | ICD-10-CM | POA: Diagnosis not present

## 2023-01-27 DIAGNOSIS — Z6841 Body Mass Index (BMI) 40.0 and over, adult: Secondary | ICD-10-CM | POA: Diagnosis not present

## 2023-01-27 DIAGNOSIS — E1169 Type 2 diabetes mellitus with other specified complication: Secondary | ICD-10-CM | POA: Diagnosis not present

## 2023-01-30 DIAGNOSIS — M5459 Other low back pain: Secondary | ICD-10-CM | POA: Diagnosis not present

## 2023-02-17 ENCOUNTER — Inpatient Hospital Stay (HOSPITAL_COMMUNITY)
Admission: EM | Admit: 2023-02-17 | Discharge: 2023-02-20 | DRG: 194 | Disposition: A | Discharge status: Home / Self Care | Payer: Medicare PPO | Attending: Internal Medicine | Admitting: Internal Medicine

## 2023-02-17 ENCOUNTER — Ambulatory Visit (HOSPITAL_COMMUNITY)
Admission: EM | Admit: 2023-02-17 | Discharge: 2023-02-17 | Disposition: A | Discharge status: Home / Self Care | Payer: Medicare PPO

## 2023-02-17 ENCOUNTER — Other Ambulatory Visit: Payer: Self-pay

## 2023-02-17 ENCOUNTER — Encounter (HOSPITAL_COMMUNITY): Payer: Self-pay | Admitting: Emergency Medicine

## 2023-02-17 ENCOUNTER — Emergency Department (HOSPITAL_COMMUNITY): Payer: Medicare PPO

## 2023-02-17 ENCOUNTER — Encounter (HOSPITAL_COMMUNITY): Payer: Self-pay

## 2023-02-17 DIAGNOSIS — Z9049 Acquired absence of other specified parts of digestive tract: Secondary | ICD-10-CM

## 2023-02-17 DIAGNOSIS — Z7984 Long term (current) use of oral hypoglycemic drugs: Secondary | ICD-10-CM

## 2023-02-17 DIAGNOSIS — J189 Pneumonia, unspecified organism: Principal | ICD-10-CM | POA: Diagnosis present

## 2023-02-17 DIAGNOSIS — R0602 Shortness of breath: Secondary | ICD-10-CM | POA: Diagnosis not present

## 2023-02-17 DIAGNOSIS — Z7982 Long term (current) use of aspirin: Secondary | ICD-10-CM

## 2023-02-17 DIAGNOSIS — E119 Type 2 diabetes mellitus without complications: Secondary | ICD-10-CM | POA: Diagnosis present

## 2023-02-17 DIAGNOSIS — Z90710 Acquired absence of both cervix and uterus: Secondary | ICD-10-CM | POA: Diagnosis not present

## 2023-02-17 DIAGNOSIS — K219 Gastro-esophageal reflux disease without esophagitis: Secondary | ICD-10-CM | POA: Diagnosis present

## 2023-02-17 DIAGNOSIS — M549 Dorsalgia, unspecified: Secondary | ICD-10-CM | POA: Diagnosis present

## 2023-02-17 DIAGNOSIS — K429 Umbilical hernia without obstruction or gangrene: Secondary | ICD-10-CM | POA: Diagnosis not present

## 2023-02-17 DIAGNOSIS — E1169 Type 2 diabetes mellitus with other specified complication: Secondary | ICD-10-CM

## 2023-02-17 DIAGNOSIS — Z886 Allergy status to analgesic agent status: Secondary | ICD-10-CM | POA: Diagnosis not present

## 2023-02-17 DIAGNOSIS — R918 Other nonspecific abnormal finding of lung field: Secondary | ICD-10-CM | POA: Diagnosis not present

## 2023-02-17 DIAGNOSIS — Z79899 Other long term (current) drug therapy: Secondary | ICD-10-CM | POA: Diagnosis not present

## 2023-02-17 DIAGNOSIS — Z885 Allergy status to narcotic agent status: Secondary | ICD-10-CM

## 2023-02-17 DIAGNOSIS — I10 Essential (primary) hypertension: Secondary | ICD-10-CM | POA: Diagnosis present

## 2023-02-17 DIAGNOSIS — R109 Unspecified abdominal pain: Secondary | ICD-10-CM | POA: Insufficient documentation

## 2023-02-17 DIAGNOSIS — H1132 Conjunctival hemorrhage, left eye: Secondary | ICD-10-CM | POA: Diagnosis present

## 2023-02-17 DIAGNOSIS — Z7983 Long term (current) use of bisphosphonates: Secondary | ICD-10-CM | POA: Diagnosis not present

## 2023-02-17 DIAGNOSIS — R410 Disorientation, unspecified: Secondary | ICD-10-CM

## 2023-02-17 DIAGNOSIS — E785 Hyperlipidemia, unspecified: Secondary | ICD-10-CM | POA: Diagnosis present

## 2023-02-17 DIAGNOSIS — Z7985 Long-term (current) use of injectable non-insulin antidiabetic drugs: Secondary | ICD-10-CM | POA: Diagnosis not present

## 2023-02-17 DIAGNOSIS — J90 Pleural effusion, not elsewhere classified: Secondary | ICD-10-CM | POA: Diagnosis not present

## 2023-02-17 DIAGNOSIS — Z96651 Presence of right artificial knee joint: Secondary | ICD-10-CM | POA: Diagnosis present

## 2023-02-17 DIAGNOSIS — J918 Pleural effusion in other conditions classified elsewhere: Secondary | ICD-10-CM | POA: Diagnosis present

## 2023-02-17 DIAGNOSIS — I2510 Atherosclerotic heart disease of native coronary artery without angina pectoris: Secondary | ICD-10-CM | POA: Diagnosis not present

## 2023-02-17 DIAGNOSIS — R000 Tachycardia, unspecified: Secondary | ICD-10-CM | POA: Diagnosis present

## 2023-02-17 DIAGNOSIS — R932 Abnormal findings on diagnostic imaging of liver and biliary tract: Secondary | ICD-10-CM | POA: Diagnosis not present

## 2023-02-17 DIAGNOSIS — Z1152 Encounter for screening for COVID-19: Secondary | ICD-10-CM | POA: Diagnosis not present

## 2023-02-17 DIAGNOSIS — R4182 Altered mental status, unspecified: Secondary | ICD-10-CM | POA: Diagnosis present

## 2023-02-17 DIAGNOSIS — Z87891 Personal history of nicotine dependence: Secondary | ICD-10-CM

## 2023-02-17 DIAGNOSIS — F05 Delirium due to known physiological condition: Secondary | ICD-10-CM

## 2023-02-17 DIAGNOSIS — R0781 Pleurodynia: Secondary | ICD-10-CM | POA: Diagnosis not present

## 2023-02-17 DIAGNOSIS — E876 Hypokalemia: Secondary | ICD-10-CM | POA: Diagnosis present

## 2023-02-17 DIAGNOSIS — Z8249 Family history of ischemic heart disease and other diseases of the circulatory system: Secondary | ICD-10-CM

## 2023-02-17 DIAGNOSIS — R030 Elevated blood-pressure reading, without diagnosis of hypertension: Secondary | ICD-10-CM | POA: Insufficient documentation

## 2023-02-17 LAB — CBC
HCT: 38.7 % (ref 36.0–46.0)
Hemoglobin: 11.6 g/dL — ABNORMAL LOW (ref 12.0–15.0)
MCH: 25.6 pg — ABNORMAL LOW (ref 26.0–34.0)
MCHC: 30 g/dL (ref 30.0–36.0)
MCV: 85.2 fL (ref 80.0–100.0)
Platelets: 360 10*3/uL (ref 150–400)
RBC: 4.54 MIL/uL (ref 3.87–5.11)
RDW: 13.3 % (ref 11.5–15.5)
WBC: 10.4 10*3/uL (ref 4.0–10.5)
nRBC: 0 % (ref 0.0–0.2)

## 2023-02-17 LAB — COMPREHENSIVE METABOLIC PANEL
ALT: 15 U/L (ref 0–44)
AST: 18 U/L (ref 15–41)
Albumin: 3.2 g/dL — ABNORMAL LOW (ref 3.5–5.0)
Alkaline Phosphatase: 104 U/L (ref 38–126)
Anion gap: 10 (ref 5–15)
BUN: 13 mg/dL (ref 8–23)
CO2: 24 mmol/L (ref 22–32)
Calcium: 9.6 mg/dL (ref 8.9–10.3)
Chloride: 103 mmol/L (ref 98–111)
Creatinine, Ser: 0.9 mg/dL (ref 0.44–1.00)
GFR, Estimated: >60 mL/min (ref >60)
Glucose, Bld: 111 mg/dL — ABNORMAL HIGH (ref 70–99)
Potassium: 3.8 mmol/L (ref 3.5–5.1)
Sodium: 137 mmol/L (ref 135–145)
Total Bilirubin: 0.3 mg/dL (ref 0.3–1.2)
Total Protein: 7.5 g/dL (ref 6.5–8.1)

## 2023-02-17 NOTE — ED Triage Notes (Addendum)
Sharp back pain for a month.  Family is describing as lethargic.  Patient has taken tramadol.  Last dose at 3 .    Family member reports patient did not know where she was or what she was doing, confusion is not normal for patient.  Patient has a blood shot left eye.  Denies any new numbness or tingling

## 2023-02-17 NOTE — ED Triage Notes (Signed)
Pt to ED with complaint of back pain worsening over last month. Pain worsens with coughing or breathing.   Family reports increased confusion and lethargy today. Denies falls or recent injuries.

## 2023-02-17 NOTE — ED Provider Notes (Signed)
Patient presents today for evaluation of back pain but as I am discussing symptoms with patient daughter reports she really brought her in today because she was concerned that she was confused when she got home around 7 PM.  Patient does take tramadol for back pain but has been taking this for quite some time.  She denies any new numbness or tingling but she does have what appears to be a subconjunctival hemorrhage to her left eye which is also concerning.  Recommended further evaluation in the emergency room to rule out possible TIA versus CVA.  Patient is not showing any classic symptoms of CVA at this time.  Daughter who is with her will transport her to ED next-door via POV immediately after discharge from urgent care.   Tomi Bamberger, PA-C 02/17/23 2006

## 2023-02-18 ENCOUNTER — Emergency Department (HOSPITAL_COMMUNITY): Payer: Medicare PPO

## 2023-02-18 ENCOUNTER — Inpatient Hospital Stay (HOSPITAL_COMMUNITY): Payer: Medicare PPO

## 2023-02-18 DIAGNOSIS — Z7982 Long term (current) use of aspirin: Secondary | ICD-10-CM | POA: Diagnosis not present

## 2023-02-18 DIAGNOSIS — R932 Abnormal findings on diagnostic imaging of liver and biliary tract: Secondary | ICD-10-CM | POA: Diagnosis not present

## 2023-02-18 DIAGNOSIS — J90 Pleural effusion, not elsewhere classified: Secondary | ICD-10-CM | POA: Diagnosis not present

## 2023-02-18 DIAGNOSIS — E785 Hyperlipidemia, unspecified: Secondary | ICD-10-CM | POA: Diagnosis present

## 2023-02-18 DIAGNOSIS — Z7984 Long term (current) use of oral hypoglycemic drugs: Secondary | ICD-10-CM | POA: Diagnosis not present

## 2023-02-18 DIAGNOSIS — M549 Dorsalgia, unspecified: Secondary | ICD-10-CM | POA: Diagnosis present

## 2023-02-18 DIAGNOSIS — R918 Other nonspecific abnormal finding of lung field: Secondary | ICD-10-CM | POA: Diagnosis not present

## 2023-02-18 DIAGNOSIS — K219 Gastro-esophageal reflux disease without esophagitis: Secondary | ICD-10-CM | POA: Diagnosis present

## 2023-02-18 DIAGNOSIS — R109 Unspecified abdominal pain: Secondary | ICD-10-CM | POA: Insufficient documentation

## 2023-02-18 DIAGNOSIS — R4182 Altered mental status, unspecified: Secondary | ICD-10-CM | POA: Diagnosis present

## 2023-02-18 DIAGNOSIS — Z885 Allergy status to narcotic agent status: Secondary | ICD-10-CM | POA: Diagnosis not present

## 2023-02-18 DIAGNOSIS — Z886 Allergy status to analgesic agent status: Secondary | ICD-10-CM | POA: Diagnosis not present

## 2023-02-18 DIAGNOSIS — J918 Pleural effusion in other conditions classified elsewhere: Secondary | ICD-10-CM | POA: Diagnosis present

## 2023-02-18 DIAGNOSIS — H1132 Conjunctival hemorrhage, left eye: Secondary | ICD-10-CM | POA: Insufficient documentation

## 2023-02-18 DIAGNOSIS — R000 Tachycardia, unspecified: Secondary | ICD-10-CM | POA: Diagnosis present

## 2023-02-18 DIAGNOSIS — R030 Elevated blood-pressure reading, without diagnosis of hypertension: Secondary | ICD-10-CM | POA: Insufficient documentation

## 2023-02-18 DIAGNOSIS — Z9049 Acquired absence of other specified parts of digestive tract: Secondary | ICD-10-CM | POA: Diagnosis not present

## 2023-02-18 DIAGNOSIS — Z8249 Family history of ischemic heart disease and other diseases of the circulatory system: Secondary | ICD-10-CM | POA: Diagnosis not present

## 2023-02-18 DIAGNOSIS — J189 Pneumonia, unspecified organism: Secondary | ICD-10-CM | POA: Diagnosis present

## 2023-02-18 DIAGNOSIS — Z96651 Presence of right artificial knee joint: Secondary | ICD-10-CM | POA: Diagnosis present

## 2023-02-18 DIAGNOSIS — Z7985 Long-term (current) use of injectable non-insulin antidiabetic drugs: Secondary | ICD-10-CM | POA: Diagnosis not present

## 2023-02-18 DIAGNOSIS — I2510 Atherosclerotic heart disease of native coronary artery without angina pectoris: Secondary | ICD-10-CM | POA: Diagnosis not present

## 2023-02-18 DIAGNOSIS — Z90710 Acquired absence of both cervix and uterus: Secondary | ICD-10-CM | POA: Diagnosis not present

## 2023-02-18 DIAGNOSIS — Z7983 Long term (current) use of bisphosphonates: Secondary | ICD-10-CM | POA: Diagnosis not present

## 2023-02-18 DIAGNOSIS — Z79899 Other long term (current) drug therapy: Secondary | ICD-10-CM | POA: Diagnosis not present

## 2023-02-18 DIAGNOSIS — Z87891 Personal history of nicotine dependence: Secondary | ICD-10-CM | POA: Diagnosis not present

## 2023-02-18 DIAGNOSIS — Z1152 Encounter for screening for COVID-19: Secondary | ICD-10-CM | POA: Diagnosis not present

## 2023-02-18 DIAGNOSIS — K429 Umbilical hernia without obstruction or gangrene: Secondary | ICD-10-CM | POA: Diagnosis not present

## 2023-02-18 DIAGNOSIS — I10 Essential (primary) hypertension: Secondary | ICD-10-CM | POA: Diagnosis present

## 2023-02-18 DIAGNOSIS — E119 Type 2 diabetes mellitus without complications: Secondary | ICD-10-CM | POA: Diagnosis present

## 2023-02-18 DIAGNOSIS — E876 Hypokalemia: Secondary | ICD-10-CM | POA: Diagnosis present

## 2023-02-18 DIAGNOSIS — R410 Disorientation, unspecified: Secondary | ICD-10-CM | POA: Diagnosis not present

## 2023-02-18 LAB — URINALYSIS, ROUTINE W REFLEX MICROSCOPIC
Bilirubin Urine: NEGATIVE
Glucose, UA: NEGATIVE mg/dL
Hgb urine dipstick: NEGATIVE
Ketones, ur: 5 mg/dL — AB
Leukocytes,Ua: NEGATIVE
Nitrite: NEGATIVE
Protein, ur: NEGATIVE mg/dL
Specific Gravity, Urine: 1.03 (ref 1.005–1.030)
pH: 5 (ref 5.0–8.0)

## 2023-02-18 LAB — LIPASE, BLOOD: Lipase: 20 U/L (ref 11–51)

## 2023-02-18 LAB — RESPIRATORY PANEL BY PCR

## 2023-02-18 LAB — CBG MONITORING, ED
Glucose-Capillary: 78 mg/dL (ref 70–99)
Glucose-Capillary: 91 mg/dL (ref 70–99)

## 2023-02-18 LAB — RAPID URINE DRUG SCREEN, HOSP PERFORMED
Amphetamines: NOT DETECTED
Barbiturates: NOT DETECTED
Benzodiazepines: NOT DETECTED
Cocaine: NOT DETECTED
Opiates: NOT DETECTED
Tetrahydrocannabinol: NOT DETECTED

## 2023-02-18 LAB — I-STAT CG4 LACTIC ACID, ED
Lactic Acid, Venous: 1.7 mmol/L (ref 0.5–1.9)
Lactic Acid, Venous: 1 mmol/L (ref 0.5–1.9)

## 2023-02-18 LAB — TROPONIN I (HIGH SENSITIVITY)
Troponin I (High Sensitivity): 9 ng/L (ref <18)
Troponin I (High Sensitivity): 5 ng/L (ref <18)

## 2023-02-18 LAB — ETHANOL: Alcohol, Ethyl (B): <10 mg/dL (ref <10)

## 2023-02-18 LAB — GLUCOSE, CAPILLARY: Glucose-Capillary: 134 mg/dL — ABNORMAL HIGH (ref 70–99)

## 2023-02-18 LAB — SARS CORONAVIRUS 2 BY RT PCR: SARS Coronavirus 2 by RT PCR: NEGATIVE

## 2023-02-18 MED ORDER — IOHEXOL 350 MG/ML SOLN
75.0000 mL | Freq: Once | INTRAVENOUS | Status: AC | PRN
  Administered 2023-02-18: 75 mL via INTRAVENOUS

## 2023-02-18 MED ORDER — ALBUTEROL SULFATE (2.5 MG/3ML) 0.083% IN NEBU: 2.5000 mg | INHALATION_SOLUTION | RESPIRATORY_TRACT | Status: DC | PRN

## 2023-02-18 MED ORDER — ALENDRONATE SODIUM 70 MG PO TABS: 70.0000 mg | ORAL_TABLET | ORAL | Status: DC

## 2023-02-18 MED ORDER — SODIUM CHLORIDE 0.9 % IV SOLN
500.0000 mg | Freq: Once | INTRAVENOUS | Status: AC
  Administered 2023-02-18: 500 mg via INTRAVENOUS
  Filled 2023-02-18: qty 5

## 2023-02-18 MED ORDER — SODIUM CHLORIDE 0.9 % IV SOLN
1.0000 g | Freq: Once | INTRAVENOUS | Status: AC
  Administered 2023-02-18: 1 g via INTRAVENOUS
  Filled 2023-02-18: qty 10

## 2023-02-18 MED ORDER — LIDOCAINE 5 % EX PTCH
1.0000 | MEDICATED_PATCH | CUTANEOUS | Status: DC
  Administered 2023-02-18 – 2023-02-20 (×4): 1 via TRANSDERMAL
  Filled 2023-02-18 (×3): qty 1

## 2023-02-18 MED ORDER — SODIUM CHLORIDE 0.9 % IV BOLUS
500.0000 mL | Freq: Once | INTRAVENOUS | Status: AC
  Administered 2023-02-18: 500 mL via INTRAVENOUS

## 2023-02-18 MED ORDER — SODIUM CHLORIDE 0.9 % IV SOLN
500.0000 mg | INTRAVENOUS | Status: DC
  Administered 2023-02-19 – 2023-02-20 (×2): 500 mg via INTRAVENOUS
  Filled 2023-02-18 (×2): qty 5

## 2023-02-18 MED ORDER — OXYCODONE HCL 5 MG PO TABS
5.0000 mg | ORAL_TABLET | ORAL | Status: DC | PRN
  Administered 2023-02-18 – 2023-02-19 (×3): 5 mg via ORAL
  Filled 2023-02-18 (×3): qty 1

## 2023-02-18 MED ORDER — ONDANSETRON HCL 4 MG PO TABS: 4.0000 mg | ORAL_TABLET | Freq: Four times a day (QID) | ORAL | Status: DC | PRN

## 2023-02-18 MED ORDER — ONDANSETRON HCL 4 MG/2ML IJ SOLN: 4.0000 mg | Freq: Four times a day (QID) | INTRAMUSCULAR | Status: DC | PRN

## 2023-02-18 MED ORDER — BENZONATATE 100 MG PO CAPS
200.0000 mg | ORAL_CAPSULE | Freq: Three times a day (TID) | ORAL | Status: DC | PRN
  Administered 2023-02-19: 200 mg via ORAL
  Filled 2023-02-18: qty 2

## 2023-02-18 MED ORDER — ATORVASTATIN CALCIUM 10 MG PO TABS
20.0000 mg | ORAL_TABLET | Freq: Every day | ORAL | Status: DC
  Administered 2023-02-18 – 2023-02-19 (×2): 20 mg via ORAL
  Filled 2023-02-18 (×2): qty 2

## 2023-02-18 MED ORDER — ASPIRIN 81 MG PO TBEC
81.0000 mg | DELAYED_RELEASE_TABLET | Freq: Every day | ORAL | Status: DC
  Administered 2023-02-18 – 2023-02-20 (×3): 81 mg via ORAL
  Filled 2023-02-18 (×3): qty 1

## 2023-02-18 MED ORDER — MELATONIN 3 MG PO TABS
3.0000 mg | ORAL_TABLET | Freq: Every evening | ORAL | Status: DC | PRN
  Administered 2023-02-18: 3 mg via ORAL
  Filled 2023-02-18: qty 1

## 2023-02-18 MED ORDER — ACETAMINOPHEN 650 MG RE SUPP: 650.0000 mg | Freq: Four times a day (QID) | RECTAL | Status: DC | PRN

## 2023-02-18 MED ORDER — GUAIFENESIN ER 600 MG PO TB12
600.0000 mg | ORAL_TABLET | Freq: Two times a day (BID) | ORAL | Status: DC
  Administered 2023-02-18 – 2023-02-20 (×5): 600 mg via ORAL
  Filled 2023-02-18 (×5): qty 1

## 2023-02-18 MED ORDER — PANTOPRAZOLE SODIUM 40 MG PO TBEC
40.0000 mg | DELAYED_RELEASE_TABLET | Freq: Every day | ORAL | Status: DC
  Administered 2023-02-18 – 2023-02-20 (×3): 40 mg via ORAL
  Filled 2023-02-18 (×3): qty 1

## 2023-02-18 MED ORDER — POLYETHYLENE GLYCOL 3350 17 G PO PACK: 17.0000 g | PACK | Freq: Every day | ORAL | Status: DC | PRN

## 2023-02-18 MED ORDER — ENOXAPARIN SODIUM 40 MG/0.4ML IJ SOSY
40.0000 mg | PREFILLED_SYRINGE | INTRAMUSCULAR | Status: DC
  Administered 2023-02-18 – 2023-02-20 (×3): 40 mg via SUBCUTANEOUS
  Filled 2023-02-18 (×3): qty 0.4

## 2023-02-18 MED ORDER — SODIUM CHLORIDE 0.9 % IV SOLN
2.0000 g | INTRAVENOUS | Status: DC
  Administered 2023-02-19 – 2023-02-20 (×2): 2 g via INTRAVENOUS
  Filled 2023-02-18 (×2): qty 20

## 2023-02-18 MED ORDER — ACETAMINOPHEN 325 MG PO TABS
650.0000 mg | ORAL_TABLET | Freq: Four times a day (QID) | ORAL | Status: DC | PRN
  Administered 2023-02-18 – 2023-02-20 (×4): 650 mg via ORAL
  Filled 2023-02-18 (×4): qty 2

## 2023-02-18 NOTE — ED Notes (Signed)
Patient transported to CT scan . 

## 2023-02-18 NOTE — ED Provider Notes (Signed)
Emergency Department Provider Note   I have reviewed the triage vital signs and the nursing notes.   HISTORY  Chief Complaint Altered Mental Status and Back Pain   HPI [name] is a 72 y.o. female with PMH of DM, HLD, GERD, and back pain presents to the ED with AMS this evening noticed by family. Patient has had some mid back pain worsening over the past several weeks. She was seen as an outpatient and prescribed Tramadol which she last took at around [facility] yesterday. Her daughter called to check on her and noticed that she seemed out of breath and confused. Patient has noticed some cough (non-productive) and worsening back pain now radiating around the right lateral and anterior chest. No rash. No fever. She does feel SOB symptoms as well. No lower back or abdominal pain. Family arrived to the patient's home and initially presented to the UC but was fairly quickly re-directed to the ED by POV. Family report that patient still have not returned to her mental status baseline.   Past Medical History:  Diagnosis Date   Back pain    Diabetes mellitus without complication (HCC)    Type II   GERD (gastroesophageal reflux disease)    ocassional --takes tums   History of blood transfusion    with knee surgeries   Hyperlipemia    Joint pain     Review of Systems  Constitutional: No fever/chills Cardiovascular: Positive right sided CP.  Respiratory: Positive shortness of breath. Gastrointestinal: No abdominal pain.  No nausea, no vomiting.  No diarrhea.  Genitourinary: Negative for dysuria. Musculoskeletal: Positive mid back pain.  Skin: Negative for rash. Neurological: Negative for headaches, focal weakness or numbness.  ____________________________________________   PHYSICAL EXAM:  VITAL SIGNS: ED Triage Vitals  Encounter Vitals Group     BP 02/17/23 2037 127/87     Pulse Rate 02/17/23 2037 (!) 113     Resp 02/17/23 2037 18     Temp 02/17/23 2037 98.3 F (36.8 C)      Temp Source 02/17/23 2037 Oral     SpO2 02/17/23 2037 93 %   Constitutional: Alert. No acute distress but family still senses some confusion.  Eyes: Conjunctivae are normal. Head: Atraumatic. Nose: No congestion/rhinnorhea. Mouth/Throat: Mucous membranes are moist.   Neck: No stridor.  Cardiovascular: Tachycardia. Good peripheral circulation. Grossly normal heart sounds.   Respiratory: Normal respiratory effort.  No retractions. Lungs CTAB. Gastrointestinal: Soft and nontender. No distention.  Musculoskeletal: No lower extremity tenderness nor edema. No gross deformities of extremities. Tenderness to palpation along the right lateral and anterior chest wall. No crepitus.  Neurologic:  Normal speech and language. No gross focal neurologic deficits are appreciated. 5/5 strength in the bilateral upper extremities.  Skin:  Skin is warm, dry and intact. No rash noted.  ____________________________________________   LABS (all labs ordered are listed, but only abnormal results are displayed)  Labs Reviewed  COMPREHENSIVE METABOLIC PANEL - Abnormal; Notable for the following components:      Result Value   Glucose, Bld 111 (*)    Albumin 3.2 (*)    All other components within normal limits  CBC - Abnormal; Notable for the following components:   Hemoglobin 11.6 (*)    MCH 25.6 (*)    All other components within normal limits  URINALYSIS, ROUTINE W REFLEX MICROSCOPIC - Abnormal; Notable for the following components:   Ketones, ur 5 (*)    All other components within normal limits  BASIC METABOLIC PANEL - Abnormal; Notable for the following components:   Potassium 3.4 (*)    Glucose, Bld 103 (*)    Calcium 8.8 (*)    All other components within normal limits  CBC - Abnormal; Notable for the following components:   Hemoglobin 10.5 (*)    HCT 35.2 (*)    MCH 25.6 (*)    MCHC 29.8 (*)    All other components within normal limits  HEMOGLOBIN A1C - Abnormal; Notable for the following  components:   Hgb A1c MFr Bld 6.3 (*)    All other components within normal limits  GLUCOSE, CAPILLARY - Abnormal; Notable for the following components:   Glucose-Capillary 134 (*)    All other components within normal limits  GLUCOSE, CAPILLARY - Abnormal; Notable for the following components:   Glucose-Capillary 102 (*)    All other components within normal limits  GLUCOSE, CAPILLARY - Abnormal; Notable for the following components:   Glucose-Capillary 125 (*)    All other components within normal limits  GLUCOSE, CAPILLARY - Abnormal; Notable for the following components:   Glucose-Capillary 121 (*)    All other components within normal limits  GLUCOSE, CAPILLARY - Abnormal; Notable for the following components:   Glucose-Capillary 123 (*)    All other components within normal limits  SARS CORONAVIRUS 2 BY RT PCR  CULTURE, BLOOD (ROUTINE X 2)  CULTURE, BLOOD (ROUTINE X 2)  RESPIRATORY PANEL BY PCR  LIPASE, BLOOD  ETHANOL  RAPID URINE DRUG SCREEN, HOSP PERFORMED  LEGIONELLA PNEUMOPHILA SEROGP 1 UR AG  STREP PNEUMONIAE URINARY ANTIGEN  GLUCOSE, CAPILLARY  GLUCOSE, CAPILLARY  CBG MONITORING, ED  I-STAT CG4 LACTIC ACID, ED  I-STAT CG4 LACTIC ACID, ED  CBG MONITORING, ED  TROPONIN I (HIGH SENSITIVITY)  TROPONIN I (HIGH SENSITIVITY)   ____________________________________________  EKG   EKG Interpretation Date/Time:  Tuesday February 18 2023 01:26:43 EDT Ventricular Rate:  100 PR Interval:  142 QRS Duration:  94 QT Interval:  345 QTC Calculation: 445 R Axis:   -5  Text Interpretation: Sinus tachycardia Low voltage, precordial leads Posterior infarct, old Confirmed by Alona Bene [phone]) on 02/18/2023 1:31:18 AM        ____________________________________________  RADIOLOGY  DG Chest 2 View  Result Date: 02/17/2023 CLINICAL DATA:  Chest pain, right-sided rib pain EXAM: CHEST - 2 VIEW COMPARISON:  09/27/2016 FINDINGS: Single frontal view of the chest  demonstrates an unremarkable cardiac silhouette. There is patchy airspace disease within the right upper and right lower lobe, consistent with multifocal bronchopneumonia. Small right parapneumonic effusion. No pneumothorax. No acute bony abnormalities. IMPRESSION: 1. Multifocal right-sided bronchopneumonia and small parapneumonic effusion. Electronically Signed   By: Sharlet Salina M.D.   On: 02/17/2023 22:12    ____________________________________________   PROCEDURES  Procedure(s) performed:   Procedures  CRITICAL CARE Performed by: Maia Plan Total critical care time: 35 minutes Critical care time was exclusive of separately billable procedures and treating other patients. Critical care was necessary to treat or prevent imminent or life-threatening deterioration. Critical care was time spent personally by me on the following activities: development of treatment plan with patient and/or surrogate as well as nursing, discussions with consultants, evaluation of patient's response to treatment, examination of patient, obtaining history from patient or surrogate, ordering and performing treatments and interventions, ordering and review of laboratory studies, ordering and review of radiographic studies, pulse oximetry and re-evaluation of patient's condition.  Alona Bene, MD Emergency Medicine  ____________________________________________   INITIAL  IMPRESSION / ASSESSMENT AND PLAN / ED COURSE  Pertinent labs & imaging results that were available during my care of the patient were reviewed by me and considered in my medical decision making (see chart for details).   This patient is Presenting for Evaluation of AMS, which does require a range of treatment options, and is a complaint that involves a high risk of morbidity and mortality.  The Differential Diagnoses includes but is not exclusive to alcohol, illicit or prescription medications, intracranial pathology such as stroke,  intracerebral hemorrhage, fever or infectious causes including sepsis, hypoxemia, uremia, trauma, endocrine related disorders such as diabetes, hypoglycemia, thyroid-related diseases, etc.   Critical Interventions-    Medications  sodium chloride 0.9 % bolus 500 mL (0 mLs Intravenous Stopped 02/18/23 0310)  cefTRIAXone (ROCEPHIN) 1 g in sodium chloride 0.9 % 100 mL IVPB (0 g Intravenous Stopped 02/18/23 0310)  azithromycin (ZITHROMAX) 500 mg in sodium chloride 0.9 % 250 mL IVPB (0 mg Intravenous Stopped 02/18/23 0339)  iohexol (OMNIPAQUE) 350 MG/ML injection 75 mL (75 mLs Intravenous Contrast Given 02/18/23 0234)  potassium chloride SA (KLOR-CON M) CR tablet 40 mEq (40 mEq Oral Given 02/19/23 1027)    Reassessment after intervention: symptoms improved.    I did obtain Additional Historical Information from daughter at bedside.    Clinical Laboratory Tests Ordered, included CBC without leukocytosis. No AKI.   Radiologic Tests Ordered, included CXR. I independently interpreted the images and agree with radiology interpretation.   Cardiac Monitor Tracing which shows tachycardia.    Social Determinants of Health Risk patient is a non-smoker.   Consult complete with [facility]. Plan for admit.   Medical Decision Making: Summary:  Patient presents to the ED with AMS, chest pain/cough, and SOB. Mild tachycardia here without hypotension. Doubt sepsis but patient does have CXR findings concerning for possible PNA. Differential includes CAP, viral pneumonia, PE, etc. Plan for CTA chest and CT head with additional labs. No Rx for tramadol may be contributing to AMS as well.   Reevaluation with update and discussion with patient and family. Plan for abx and admit. They are in agreement.   Patient's presentation is most consistent with acute presentation with potential threat to life or bodily function.   Disposition: admit  ____________________________________________  FINAL CLINICAL IMPRESSION(S) /  ED DIAGNOSES  Final diagnoses:  Multifocal pneumonia  Disorientation     NEW OUTPATIENT MEDICATIONS STARTED DURING THIS VISIT:  Discharge Medication List as of 02/20/2023 12:04 PM     START taking these medications   Details  azithromycin (ZITHROMAX Z-PAK) 250 MG tablet Take 1 tablet (250 mg total) by mouth daily for 2 days., Starting Fri 02/21/2023, Until Sun 02/23/2023, Normal    cefdinir (OMNICEF) 300 MG capsule Take 1 capsule (300 mg total) by mouth 2 (two) times daily for 2 days., Starting Fri 02/21/2023, Until Sun 02/23/2023, Normal    methocarbamol (ROBAXIN-750) 750 MG tablet Take 1 tablet (750 mg total) by mouth every 6 (six) hours as needed for muscle spasms (back pain)., Starting Thu 02/20/2023, Normal    chlorpheniramine-HYDROcodone (TUSSIONEX) 10-8 MG/5ML Take 5 mLs by mouth every 12 (twelve) hours for 5 days., Starting Thu 02/20/2023, Until Tue 02/25/2023, Normal    guaiFENesin (MUCINEX) 600 MG 12 hr tablet Take 1 tablet (600 mg total) by mouth 2 (two) times daily for 5 days., Starting Thu 02/20/2023, Until Tue 02/25/2023, Normal    pantoprazole (PROTONIX) 40 MG tablet Take 1 tablet (40 mg total) by mouth daily., Starting Fri  02/21/2023, Normal        Note:  This document was prepared using Dragon voice recognition software and may include unintentional dictation errors.  Alona Bene, MD, [facility] Emergency Medicine    Madeline Bebout, Arlyss Repress, MD 02/25/23 [phone]

## 2023-02-18 NOTE — ED Notes (Signed)
Patient transported to Ct

## 2023-02-18 NOTE — ED Notes (Signed)
ED TO INPATIENT HANDOFF REPORT  ED Nurse Name and Phone #: Yarel Kilcrease [phone]  S Name/Age/Gender [name] 72 y.o. female Room/Bed: 046C/046C  Code Status   Code Status: Full Code  Home/SNF/Other Home Patient oriented to: self, place, time, and situation Is this baseline? Yes   Triage Complete: Triage complete  Chief Complaint Pneumonia [J18.9]  Triage Note Pt to ED with complaint of back pain worsening over last month. Pain worsens with coughing or breathing.   Family reports increased confusion and lethargy today. Denies falls or recent injuries.    Allergies Allergies  Allergen Reactions   Codeine Itching   Morphine Itching   Sulindac Itching    Burnt spots    Level of Care/Admitting Diagnosis ED Disposition     ED Disposition  Admit   Condition  --   Comment  Hospital Area: [facility] [[id number]]  Level of Care: Telemetry Medical [104]  May admit patient to [facility] or [facility] if equivalent level of care is available:: No  Covid Evaluation: Confirmed COVID Negative  Diagnosis: Pneumonia [227785]  Admitting Physician: Leatha Gilding [phone]  Attending Physician: Leatha Gilding [phone]  Certification:: I certify this patient will need inpatient services for at least 2 midnights  Expected Medical Readiness: 02/20/2023          B Medical/Surgery History Past Medical History:  Diagnosis Date   Back pain    Diabetes mellitus without complication (HCC)    Type II   GERD (gastroesophageal reflux disease)    ocassional --takes tums   History of blood transfusion    with knee surgeries   Hyperlipemia    Joint pain    Past Surgical History:  Procedure Laterality Date   ABDOMINAL HYSTERECTOMY     BREAST BIOPSY Left 09/10/2022   MM LT BREAST BX W LOC DEV 1ST LESION IMAGE BX SPEC STEREO GUIDE 09/10/2022 [facility]MO[facility]   CHOLECYSTECTOMY     COLONOSCOPY     COLONOSCOPY WITH PROPOFOL N/A 08/26/2017   Procedure: COLONOSCOPY WITH  PROPOFOL;  Surgeon: Graylin Shiver, MD;  Location: [facility] ENDOSCOPY;  Service: Endoscopy;  Laterality: N/A;   LEG SURGERY Right    fracture   REPLACEMENT TOTAL KNEE Right    "5"- patient reports     A IV Location/Drains/Wounds Patient Lines/Drains/Airways Status     Active Line/Drains/Airways     Name Placement date Placement time Site Days   Peripheral IV 02/18/23 18 G Left Antecubital 02/18/23  0144  Antecubital  less than 1            Intake/Output Last 24 hours No intake or output data in the 24 hours ending 02/18/23 1617  Labs/Imaging Results for orders placed or performed during the hospital encounter of 02/17/23 (from the past 48 hour(s))  Comprehensive metabolic panel     Status: Abnormal   Collection Time: 02/17/23  8:50 PM  Result Value Ref Range   Sodium 137 135 - 145 mmol/L   Potassium 3.8 3.5 - 5.1 mmol/L   Chloride 103 98 - 111 mmol/L   CO2 24 22 - 32 mmol/L   Glucose, Bld 111 (H) 70 - 99 mg/dL    Comment: Glucose reference range applies only to samples taken after fasting for at least 8 hours.   BUN 13 8 - 23 mg/dL   Creatinine, Ser 5.40 0.44 - 1.00 mg/dL   Calcium 9.6 8.9 - 98.1 mg/dL   Total Protein 7.5 6.5 - 8.1 g/dL  Albumin 3.2 (L) 3.5 - 5.0 g/dL   AST 18 15 - 41 U/L   ALT 15 0 - 44 U/L   Alkaline Phosphatase 104 38 - 126 U/L   Total Bilirubin 0.3 0.3 - 1.2 mg/dL   GFR, Estimated >16 >10 mL/min    Comment: (NOTE) Calculated using the CKD-EPI Creatinine Equation (2021)    Anion gap 10 5 - 15    Comment: Performed at [facility], [address] [place], [place] [address]  CBC     Status: Abnormal   Collection Time: 02/17/23  8:50 PM  Result Value Ref Range   WBC 10.4 4.0 - 10.5 K/uL   RBC 4.54 3.87 - 5.11 MIL/uL   Hemoglobin 11.6 (L) 12.0 - 15.0 g/dL   HCT 40.9 81.1 - 91.4 %   MCV 85.2 80.0 - 100.0 fL   MCH 25.6 (L) 26.0 - 34.0 pg   MCHC 30.0 30.0 - 36.0 g/dL   RDW 78.2 95.6 - 21.3 %   Platelets 360 150 - 400 K/uL   nRBC 0.0 0.0 -  0.2 %    Comment: Performed at [facility], [address], [place], [place] [address]  Lipase, blood     Status: None   Collection Time: 02/18/23  1:36 AM  Result Value Ref Range   Lipase 20 11 - 51 U/L    Comment: Performed at [facility], [address], [place], [place] [address]  Troponin I (High Sensitivity)     Status: None   Collection Time: 02/18/23  1:36 AM  Result Value Ref Range   Troponin I (High Sensitivity) 9 <18 ng/L    Comment: (NOTE) Elevated high sensitivity troponin I (hsTnI) values and significant  changes across serial measurements may suggest ACS but many other  chronic and acute conditions are known to elevate hsTnI results.  Refer to the "Links" section for chest pain algorithms and additional  guidance. Performed at [facility], [address], [place], [place] [address]   SARS Coronavirus 2 by RT PCR (hospital order, performed in [facility] hospital lab) *cepheid single result test* Anterior Nasal Swab     Status: None   Collection Time: 02/18/23  1:36 AM   Specimen: Anterior Nasal Swab  Result Value Ref Range   SARS Coronavirus 2 by RT PCR NEGATIVE NEGATIVE    Comment: Performed at [facility], [address], [place], [place] [address]  Ethanol     Status: None   Collection Time: 02/18/23  1:36 AM  Result Value Ref Range   Alcohol, Ethyl (B) <10 <10 mg/dL    Comment: (NOTE) Lowest detectable limit for serum alcohol is 10 mg/dL.  For medical purposes only. Performed at [facility], [address] [place], [place] [address]   Culture, blood (routine x 2)     Status: None (Preliminary result)   Collection Time: 02/18/23  2:08 AM   Specimen: BLOOD LEFT HAND  Result Value Ref Range   Specimen Description BLOOD LEFT HAND    Special Requests      BOTTLES DRAWN AEROBIC AND ANAEROBIC Blood Culture adequate volume   Culture      NO GROWTH < 12 HOURS Performed at [facility], [address], [place],  [place] [address]    Report Status PENDING   Culture, blood (routine x 2)     Status: None (Preliminary result)   Collection Time: 02/18/23  2:08 AM   Specimen: BLOOD RIGHT HAND  Result Value Ref Range   Specimen Description BLOOD RIGHT HAND    Special Requests      BOTTLES DRAWN AEROBIC AND ANAEROBIC Blood Culture adequate volume   Culture      NO GROWTH < 12 HOURS Performed at [facility], [address] [place], [place] [address]    Report Status PENDING   I-Stat Lactic Acid     Status: None   Collection Time: 02/18/23  2:16 AM  Result Value Ref Range   Lactic Acid, Venous 1.7 0.5 - 1.9 mmol/L  Troponin I (High Sensitivity)     Status: None   Collection Time: 02/18/23  3:45 AM  Result Value Ref Range   Troponin I (High Sensitivity) 5 <18 ng/L    Comment: (NOTE) Elevated high sensitivity troponin I (hsTnI) values and significant  changes across serial measurements may suggest ACS but many other  chronic and acute conditions are known to elevate hsTnI results.  Refer to the "Links" section for chest pain algorithms and additional  guidance. Performed at [facility], [address], [place], [place] [address]   Urinalysis, Routine w reflex microscopic -Urine, Clean Catch     Status: Abnormal   Collection Time: 02/18/23  3:48 AM  Result Value Ref Range   Color, Urine YELLOW YELLOW   APPearance CLEAR CLEAR   Specific Gravity, Urine 1.030 1.005 - 1.030   pH 5.0 5.0 - 8.0   Glucose, UA NEGATIVE NEGATIVE mg/dL   Hgb urine dipstick NEGATIVE NEGATIVE   Bilirubin Urine NEGATIVE NEGATIVE   Ketones, ur 5 (A) NEGATIVE mg/dL   Protein, ur NEGATIVE NEGATIVE mg/dL   Nitrite NEGATIVE NEGATIVE   Leukocytes,Ua NEGATIVE NEGATIVE    Comment: Performed at [facility] Lab, [address], [place], [place] [address]  Urine rapid drug screen (hosp performed)     Status: None   Collection Time: 02/18/23  3:48 AM  Result Value Ref Range   Opiates NONE DETECTED NONE DETECTED    Cocaine NONE DETECTED NONE DETECTED   Benzodiazepines NONE DETECTED NONE DETECTED   Amphetamines NONE DETECTED NONE DETECTED   Tetrahydrocannabinol NONE DETECTED NONE DETECTED   Barbiturates NONE DETECTED NONE DETECTED    Comment: (NOTE) DRUG SCREEN FOR MEDICAL PURPOSES ONLY.  IF CONFIRMATION IS NEEDED FOR ANY PURPOSE, NOTIFY LAB WITHIN 5 DAYS.  LOWEST DETECTABLE LIMITS FOR URINE DRUG SCREEN Drug Class                     Cutoff (ng/mL) Amphetamine and metabolites    1000 Barbiturate and metabolites    200 Benzodiazepine                 200 Opiates and metabolites        300 Cocaine and metabolites        300 THC                            50 Performed at [facility], [address], [place], [place] [address]   I-Stat Lactic Acid     Status: None   Collection Time: 02/18/23  3:55 AM  Result Value Ref Range   Lactic Acid, Venous 1.0 0.5 - 1.9 mmol/L  Respiratory (~20 pathogens) panel by PCR     Status: None   Collection Time: 02/18/23  8:13 AM   Specimen: Nasopharyngeal Swab;  Respiratory  Result Value Ref Range   Adenovirus NOT DETECTED NOT DETECTED   Coronavirus 229E NOT DETECTED NOT DETECTED    Comment: (NOTE) The Coronavirus on the Respiratory Panel, DOES NOT test for the novel  Coronavirus (2019 nCoV)    Coronavirus HKU1 NOT DETECTED NOT DETECTED   Coronavirus NL63 NOT DETECTED NOT DETECTED   Coronavirus OC43 NOT DETECTED NOT DETECTED   Metapneumovirus NOT DETECTED NOT DETECTED   Rhinovirus / Enterovirus NOT DETECTED NOT DETECTED   Influenza A NOT DETECTED NOT DETECTED   Influenza B NOT DETECTED NOT DETECTED   Parainfluenza Virus 1 NOT DETECTED NOT DETECTED   Parainfluenza Virus 2 NOT DETECTED NOT DETECTED   Parainfluenza Virus 3 NOT DETECTED NOT DETECTED   Parainfluenza Virus 4 NOT DETECTED NOT DETECTED   Respiratory Syncytial Virus NOT DETECTED NOT DETECTED   Bordetella pertussis NOT DETECTED NOT DETECTED   Bordetella Parapertussis NOT DETECTED NOT  DETECTED   Chlamydophila pneumoniae NOT DETECTED NOT DETECTED   Mycoplasma pneumoniae NOT DETECTED NOT DETECTED    Comment: Performed at [facility], [address] [place], [place] [address]  CBG monitoring, ED     Status: None   Collection Time: 02/18/23 12:26 PM  Result Value Ref Range   Glucose-Capillary 78 70 - 99 mg/dL    Comment: Glucose reference range applies only to samples taken after fasting for at least 8 hours.   Comment 1 Notify RN    Comment 2 Document in Chart    CT ABDOMEN PELVIS WO CONTRAST  Result Date: 02/18/2023 CLINICAL DATA:  Acute non localized abdominal pain. EXAM: CT ABDOMEN AND PELVIS WITHOUT CONTRAST TECHNIQUE: Multidetector CT imaging of the abdomen and pelvis was performed following the standard protocol without IV contrast. RADIATION DOSE REDUCTION: This exam was performed according to the departmental dose-optimization program which includes automated exposure control, adjustment of the mA and/or kV according to patient size and/or use of iterative reconstruction technique. COMPARISON:  None Available. FINDINGS: Lower chest: Small right pleural effusion. Hepatobiliary: No suspicious mass visualized on this unenhanced exam. Probable sub-centimeter cyst in anterior right hepatic lobe. Prior cholecystectomy. No evidence of biliary obstruction. Pancreas: No mass or inflammatory process visualized on this unenhanced exam. Spleen:  Within normal limits in size. 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 No evidence of urolithiasis or hydronephrosis. Small amount of contrast in renal collecting systems and contrast filling the urinary bladder, attributable to recent chest CTA. Stomach/Bowel: No evidence of obstruction, inflammatory process, or abnormal fluid collections. Normal appendix visualized. Vascular/Lymphatic: No pathologically enlarged lymph  nodes identified. No evidence of abdominal aortic aneurysm. Reproductive:  No mass or other significant abnormality. Other: Small midline epigastric and umbilical hernias are seen, which contain only fat. Musculoskeletal:  No suspicious bone lesions identified. IMPRESSION: No acute findings within the abdomen or pelvis. Small midline epigastric and umbilical abdominal wall hernias, which contain only fat. Small right pleural effusion. Electronically Signed   By: Danae Orleans M.D.   On: 02/18/2023 14:01   CT Angio Chest PE W and/or Wo Contrast  Result Date: 02/18/2023 CLINICAL DATA:  Back pain worsening over the last month. Worsening with coughing or breathing. Increased confusion and lethargy. EXAM: CT ANGIOGRAPHY CHEST WITH CONTRAST TECHNIQUE: Multidetector CT imaging of the chest was performed using the standard protocol during bolus administration of intravenous contrast. Multiplanar CT image reconstructions and MIPs were obtained to evaluate the vascular  anatomy. RADIATION DOSE REDUCTION: This exam was performed according to the departmental dose-optimization program which includes automated exposure control, adjustment of the mA and/or kV according to patient size and/or use of iterative reconstruction technique. CONTRAST:  75mL OMNIPAQUE IOHEXOL 350 MG/ML SOLN COMPARISON:  Radiograph 02/17/2023 FINDINGS: Cardiovascular: Respiratory motion obscures the segmental and subsegmental arteries. No central pulmonary embolism. No pericardial effusion. Normal caliber thoracic aorta without dissection. Coronary artery and aortic atherosclerotic calcification. Mediastinum/Nodes: Bowing of posterior trachea compatible with aspiration. The esophagus is unremarkable. 1.1 cm right pretracheal node on 6/36. Lungs/Pleura: Patchy bilateral consolidative opacities with associated interlobular septal thickening greatest in the upper lobes. Small right pleural effusion. No pneumothorax. Upper Abdomen: 2 low-density lesions in  the liver are indeterminate but statistically likely to represent benign cysts or hemangiomas. No follow-up is recommended if patient is low risk for [facility] and has no known malignancy. No acute abnormality. Musculoskeletal: No acute fracture. Review of the MIP images confirms the above findings. IMPRESSION: 1. Respiratory motion obscures the segmental and subsegmental arteries. No central pulmonary embolism. 2. Pulmonary findings compatible with multifocal pneumonia. Follow-up in 6-8 weeks after treatment is recommended to ensure resolution. 3. Small right pleural effusion. 4. Enlarged pretracheal node is likely reactive. Continued attention on follow-up. Aortic Atherosclerosis (ICD10-I70.0). Electronically Signed   By: Minerva Fester M.D.   On: 02/18/2023 02:56   CT Head Wo Contrast  Result Date: 02/18/2023 CLINICAL DATA:  Mental status change EXAM: CT HEAD WITHOUT CONTRAST TECHNIQUE: Contiguous axial images were obtained from the base of the skull through the vertex without intravenous contrast. RADIATION DOSE REDUCTION: This exam was performed according to the departmental dose-optimization program which includes automated exposure control, adjustment of the mA and/or kV according to patient size and/or use of iterative reconstruction technique. COMPARISON:  None Available. FINDINGS: Brain: No intracranial hemorrhage, mass effect, or evidence of acute infarct. No hydrocephalus. No extra-axial fluid collection. Vascular: No hyperdense vessel or unexpected calcification. Skull: No fracture or focal lesion. Sinuses/Orbits: No acute finding. Mucosal thickening about the left maxillary sinus ethmoid air cells. No mastoid effusion. Other: None. IMPRESSION: No acute intracranial abnormality. Electronically Signed   By: Minerva Fester M.D.   On: 02/18/2023 02:44   DG Chest 2 View  Result Date: 02/17/2023 CLINICAL DATA:  Chest pain, right-sided rib pain EXAM: CHEST - 2 VIEW COMPARISON:  09/27/2016 FINDINGS: Single  frontal view of the chest demonstrates an unremarkable cardiac silhouette. There is patchy airspace disease within the right upper and right lower lobe, consistent with multifocal bronchopneumonia. Small right parapneumonic effusion. No pneumothorax. No acute bony abnormalities. IMPRESSION: 1. Multifocal right-sided bronchopneumonia and small parapneumonic effusion. Electronically Signed   By: Sharlet Salina M.D.   On: 02/17/2023 22:12    Pending Labs Unresulted Labs (From admission, onward)     Start     Ordered   02/19/23 0500  Basic metabolic panel  Tomorrow morning,   R        02/18/23 0812   02/19/23 0500  CBC  Tomorrow morning,   R        02/18/23 0812   02/19/23 0500  Hemoglobin A1c  Tomorrow morning,   R        02/18/23 1142   02/18/23 0813  Expectorated Sputum Assessment w Gram Stain, Rflx to Resp Cult  (COPD / Pneumonia / Cellulitis / Lower Extremity Wound)  Once,   R        02/18/23 0812   02/18/23 0813  Legionella Pneumophila  Serogp 1 Ur Ag  (COPD / Pneumonia / Cellulitis / Lower Extremity Wound)  Once,   R        02/18/23 0812   02/18/23 0813  Strep pneumoniae urinary antigen  (COPD / Pneumonia / Cellulitis / Lower Extremity Wound)  Once,   R        02/18/23 0812            Vitals/Pain Today's Vitals   02/18/23 1400 02/18/23 1519 02/18/23 1608 02/18/23 1615  BP: (!) 161/85     Pulse: 92  94 93  Resp: 20  (!) 23   Temp:  98.4 F (36.9 C)    TempSrc:  Oral    SpO2: 97%  97% 96%  PainSc:        Isolation Precautions Droplet precaution  Medications Medications  cefTRIAXone (ROCEPHIN) 2 g in sodium chloride 0.9 % 100 mL IVPB (has no administration in time range)  azithromycin (ZITHROMAX) 500 mg in sodium chloride 0.9 % 250 mL IVPB (has no administration in time range)  enoxaparin (LOVENOX) injection 40 mg (40 mg Subcutaneous Given 02/18/23 1035)  acetaminophen (TYLENOL) tablet 650 mg (650 mg Oral Given 02/18/23 1034)    Or  acetaminophen (TYLENOL) suppository 650  mg ( Rectal See Alternative 02/18/23 1034)  polyethylene glycol (MIRALAX / GLYCOLAX) packet 17 g (has no administration in time range)  ondansetron (ZOFRAN) tablet 4 mg (has no administration in time range)    Or  ondansetron (ZOFRAN) injection 4 mg (has no administration in time range)  pantoprazole (PROTONIX) EC tablet 40 mg (40 mg Oral Given 02/18/23 1034)  melatonin tablet 3 mg (has no administration in time range)  albuterol (PROVENTIL) (2.5 MG/3ML) 0.083% nebulizer solution 2.5 mg (has no administration in time range)  benzonatate (TESSALON) capsule 200 mg (has no administration in time range)  guaiFENesin (MUCINEX) 12 hr tablet 600 mg (600 mg Oral Given 02/18/23 1035)  oxyCODONE (Oxy IR/ROXICODONE) immediate release tablet 5 mg (has no administration in time range)  aspirin EC tablet 81 mg (81 mg Oral Given 02/18/23 1306)  atorvastatin (LIPITOR) tablet 20 mg (has no administration in time range)  lidocaine (LIDODERM) 5 % 1 patch (1 patch Transdermal Patch Applied 02/18/23 1306)  sodium chloride 0.9 % bolus 500 mL (0 mLs Intravenous Stopped 02/18/23 0310)  cefTRIAXone (ROCEPHIN) 1 g in sodium chloride 0.9 % 100 mL IVPB (0 g Intravenous Stopped 02/18/23 0310)  azithromycin (ZITHROMAX) 500 mg in sodium chloride 0.9 % 250 mL IVPB (0 mg Intravenous Stopped 02/18/23 0339)  iohexol (OMNIPAQUE) 350 MG/ML injection 75 mL (75 mLs Intravenous Contrast Given 02/18/23 0234)    Mobility walks     Focused Assessments Neuro Assessment Handoff:  Swallow screen pass? Yes          Neuro Assessment:   Neuro Checks:      Has TPA been given? No If patient is a Neuro Trauma and patient is going to OR before floor call report to 4N Charge nurse: [phone] or [phone]   R Recommendations: See Admitting Provider Note  Report given to:   Additional Notes:

## 2023-02-18 NOTE — H&P (Addendum)
History and Physical    Patient: [name] ZOX:[id number] DOB: 1951/01/25 DOA: 02/17/2023 DOS: the patient was seen and examined on 02/18/2023 PCP: Merri Brunette, MD  Patient coming from: Home  Chief Complaint:  Chief Complaint  Patient presents with   Altered Mental Status   Back Pain   HPI: [name] is a 72 y.o. female with medical history significant of diabetes mellitus, type 2 and hyperlipidemia. The patient reports that she presented to [facility] ED for worsening back pain. She was seen last week and prescribed Tramadol that she began taking Thursday. She reports minimal improvement in pain since starting Tramadol. She describes (R) 7/10 back pain that radiates to her mid abdomen. Reports pain score decreases from 7-->5/10 after taking tramadol. Her daughter reports she brought [name] into the ED because yesterday evening she was noted to be confused, lethargic, and short of breath. The patient denies chest pain or palpitations, denies nausea, vomiting, or change in appetite. During the interview she does have mild conversation dyspnea. During my interview [name] appears cognitively back to baseline and a reliable historian. Daughter also reports mental status has improved since ED arrival.  ED Course:  On arrival to [facility] ED [name] was noted to be afebrile Temp 37.5C, HR 109, BP 120/63, RR 20, and SPO2 94% on room air. Lipase negative, Troponin 9-->5, COVID negative, Lactic Acid 1.7. Urinalysis not indicative of UTI, UDS negative. Blood cultures collected. CT Head negative for acute abnormality, CXR and CT Chest indicative of pneumonia and parapneumonic effusion. CT Chest showing tracheal bowing concerning for aspiration pneumonia. CTA PE negative for PE or aortic dissection. [name] was given Rocephin and Flagyl and a NS bolus in the ED. TRH contacted for admission.  Review of Systems: As mentioned in the history of present illness. All other systems reviewed  and are negative. Past Medical History:  Diagnosis Date   Back pain    Diabetes mellitus without complication (HCC)    Type II   GERD (gastroesophageal reflux disease)    ocassional --takes tums   History of blood transfusion    with knee surgeries   Hyperlipemia    Joint pain    Past Surgical History:  Procedure Laterality Date   ABDOMINAL HYSTERECTOMY     BREAST BIOPSY Left 09/10/2022   MM LT BREAST BX W LOC DEV 1ST LESION IMAGE BX SPEC STEREO GUIDE 09/10/2022 [facility] MAMMOGRAPHY   CHOLECYSTECTOMY     COLONOSCOPY     COLONOSCOPY WITH PROPOFOL N/A 08/26/2017   Procedure: COLONOSCOPY WITH PROPOFOL;  Surgeon: Graylin Shiver, MD;  Location: [facility] ENDOSCOPY;  Service: Endoscopy;  Laterality: N/A;   LEG SURGERY Right    fracture   REPLACEMENT TOTAL KNEE Right    "5"- patient reports   Social History:  reports that she quit smoking about 25 years ago. She started smoking about 26 years ago. She has never used smokeless tobacco. She reports that she does not drink alcohol and does not use drugs.  Allergies  Allergen Reactions   Codeine Itching   Morphine Itching   Sulindac Itching    Burnt spots    Family History  Problem Relation Age of Onset   Heart disease Mother    Cancer Father    Cancer Brother    Cirrhosis Brother     Prior to Admission medications   Medication Sig Start Date End Date Taking? Authorizing Provider  alendronate (FOSAMAX) 70 MG tablet Take 70 mg  by mouth once a week. Monday   Yes [provider]  aspirin (ASPIRIN EC) 81 MG EC tablet Take 81 mg by mouth daily.     Yes [provider]  atorvastatin (LIPITOR) 20 MG tablet TAKE ONE TABLET BY MOUTH AT BEDTIME 04/07/15  Yes Bosie Clos, MD  Calcium Carb-Cholecalciferol (CALCIUM 600/VITAMIN D3 PO) Take 1 tablet by mouth daily.   Yes [provider]  cholecalciferol (VITAMIN D3) 25 MCG (1000 UNIT) tablet Take 1,000 Units by mouth daily.   Yes [provider]  furosemide  (LASIX) 40 MG tablet Take 40 mg by mouth See admin instructions. Take 1 tablet by mouth 2 to 3 times a week   Yes [provider]  metFORMIN (GLUCOPHAGE) 500 MG tablet Take 1 tablet (500 mg total) by mouth 2 (two) times daily with a meal. Patient taking differently: Take 1,000 mg by mouth 2 (two) times daily with a meal. 01/28/13  Yes Kuneff, Renee A, DO  Multiple Vitamins-Minerals (COMPLETE MULTIVITAMIN/MINERAL PO) Take 1 tablet by mouth daily.   Yes [provider]  Omega-3 Fatty Acids (FISH OIL PO) Take 1 capsule by mouth in the morning and at bedtime.   Yes [provider]  Semaglutide,0.25 or 0.5MG /DOS, (OZEMPIC, 0.25 OR 0.5 MG/DOSE,) 2 MG/1.5ML SOPN Inject 0.5 mg into the skin once a week. Sunday 06/05/21  Yes [provider]  traMADol (ULTRAM) 50 MG tablet Take 50 mg by mouth 3 (three) times daily as needed. 01/25/22  Yes [provider]  Accu-Chek FastClix Lancets MISC  04/08/22   [provider]    Physical Exam: Vitals:   02/18/23 0500 02/18/23 0512 02/18/23 0515 02/18/23 0600  BP: (!) 148/75  (!) 157/73 (!) 142/87  Pulse: 94  95 93  Resp: 20  (!) 25 (!) 21  Temp:  97.8 F (36.6 C)    TempSrc:  Temporal    SpO2: 93%  93% 96%    Constitutional: NAD, calm, comfortable Eyes: PERRL, conjunctival hemorrhage of (L) eye ENMT: Mucous membranes are moist. Posterior pharynx clear of any exudate or lesions.Normal dentition.  Neck: normal, supple, no masses, no thyromegaly Respiratory: Bilateral upper lobe wheezing, no crackles. Mild dyspnea with conversation. No accessory muscle use.  Cardiovascular: Regular rate and rhythm, no murmurs / rubs / gallops. No extremity edema. 2+ radial and pedal pulses.  Abdomen: no tenderness, no masses palpated. No hepatosplenomegaly. Bowel sounds positive.  Musculoskeletal: no clubbing / cyanosis. No joint deformity upper and lower extremities. Good ROM, no contractures. Normal muscle tone.  Skin: no  rashes, lesions, ulcers.  Neurologic: Alert and oriented x 3. Normal mood.   Data Reviewed: CBC    Component Value Date/Time   WBC 10.4 02/17/2023 2050   RBC 4.54 02/17/2023 2050   HGB 11.6 (L) 02/17/2023 2050   HCT 38.7 02/17/2023 2050   PLT 360 02/17/2023 2050   MCV 85.2 02/17/2023 2050   MCH 25.6 (L) 02/17/2023 2050   MCHC 30.0 02/17/2023 2050   RDW 13.3 02/17/2023 2050   CMP     Component Value Date/Time   NA 137 02/17/2023 2050   NA 143 01/15/2022 1502   K 3.8 02/17/2023 2050   CL 103 02/17/2023 2050   CO2 24 02/17/2023 2050   GLUCOSE 111 (H) 02/17/2023 2050   BUN 13 02/17/2023 2050   BUN 14 01/15/2022 1502   CREATININE 0.90 02/17/2023 2050   CREATININE 0.90 12/09/2011 1121   CALCIUM 9.6 02/17/2023 2050   PROT  7.5 02/17/2023 2050   PROT 6.4 01/15/2022 1502   ALBUMIN 3.2 (L) 02/17/2023 2050   ALBUMIN 3.8 01/15/2022 1502   AST 18 02/17/2023 2050   ALT 15 02/17/2023 2050   ALKPHOS 104 02/17/2023 2050   BILITOT 0.3 02/17/2023 2050   BILITOT 0.3 01/15/2022 1502   EGFR 73 01/15/2022 1502   GFRNONAA >60 02/17/2023 2050   Lipase     Component Value Date/Time   LIPASE 20 02/18/2023 0136   Cardiac Panel (last 3 results) Recent Labs    02/18/23 0136 02/18/23 0345  TROPONINIHS 9 5   Alcohol Level    Component Value Date/Time   ETH <10 02/18/2023 0136   Urinalysis    Component Value Date/Time   COLORURINE YELLOW 02/18/2023 0348   APPEARANCEUR CLEAR 02/18/2023 0348   LABSPEC 1.030 02/18/2023 0348   PHURINE 5.0 02/18/2023 0348   GLUCOSEU NEGATIVE 02/18/2023 0348   HGBUR NEGATIVE 02/18/2023 0348   BILIRUBINUR NEGATIVE 02/18/2023 0348   KETONESUR 5 (A) 02/18/2023 0348   PROTEINUR NEGATIVE 02/18/2023 0348   UROBILINOGEN 0.2 12/14/2010 0930   NITRITE NEGATIVE 02/18/2023 0348   LEUKOCYTESUR NEGATIVE 02/18/2023 0348    Drugs of Abuse     Component Value Date/Time   LABOPIA NONE DETECTED 02/18/2023 0348   COCAINSCRNUR NONE DETECTED 02/18/2023 0348    LABBENZ NONE DETECTED 02/18/2023 0348   AMPHETMU NONE DETECTED 02/18/2023 0348   THCU NONE DETECTED 02/18/2023 0348   LABBARB NONE DETECTED 02/18/2023 0348    Lactic Acid, Venous    Component Value Date/Time   LATICACIDVEN 1.0 02/18/2023 0355   Recent Results (from the past 240 hour(s))  SARS Coronavirus 2 by RT PCR (hospital order, performed in [facility] hospital lab) *cepheid single result test* Anterior Nasal Swab     Status: None   Collection Time: 02/18/23  1:36 AM   Specimen: Anterior Nasal Swab  Result Value Ref Range Status   SARS Coronavirus 2 by RT PCR NEGATIVE NEGATIVE Final    Comment: Performed at [facility], [address], [place], [place] [address]  Culture, blood (routine x 2)     Status: None (Preliminary result)   Collection Time: 02/18/23  2:08 AM   Specimen: BLOOD LEFT HAND  Result Value Ref Range Status   Specimen Description BLOOD LEFT HAND  Final   Special Requests   Final    BOTTLES DRAWN AEROBIC AND ANAEROBIC Blood Culture adequate volume   Culture   Final    NO GROWTH < 12 HOURS Performed at [facility], [address] [place], [place] [address]    Report Status PENDING  Incomplete  Culture, blood (routine x 2)     Status: None (Preliminary result)   Collection Time: 02/18/23  2:08 AM   Specimen: BLOOD RIGHT HAND  Result Value Ref Range Status   Specimen Description BLOOD RIGHT HAND  Final   Special Requests   Final    BOTTLES DRAWN AEROBIC AND ANAEROBIC Blood Culture adequate volume   Culture   Final    NO GROWTH < 12 HOURS Performed at [facility], [address] [place], [place] [address]    Report Status PENDING  Incomplete   CT Angio Chest PE W and/or Wo Contrast  Result Date: 02/18/2023 CLINICAL DATA:  Back pain worsening over the last month. Worsening with coughing or breathing. Increased confusion and lethargy. EXAM: CT ANGIOGRAPHY CHEST WITH CONTRAST TECHNIQUE: Multidetector CT imaging of the chest was performed  using the standard protocol during bolus administration of intravenous contrast. Multiplanar CT image reconstructions and MIPs were obtained to evaluate the vascular anatomy. RADIATION DOSE REDUCTION: This exam was performed according to the departmental dose-optimization program which includes automated exposure control, adjustment of the mA and/or kV according to patient size and/or use of iterative reconstruction technique. CONTRAST:  75mL OMNIPAQUE IOHEXOL 350 MG/ML SOLN COMPARISON:  Radiograph 02/17/2023 FINDINGS: Cardiovascular: Respiratory motion obscures the segmental and subsegmental arteries. No central pulmonary embolism. No pericardial effusion. Normal caliber thoracic aorta without dissection. Coronary artery and aortic atherosclerotic calcification. Mediastinum/Nodes: Bowing of posterior trachea compatible with aspiration. The esophagus is unremarkable. 1.1 cm right pretracheal node on 6/36. Lungs/Pleura: Patchy bilateral consolidative opacities with associated interlobular septal thickening greatest in the upper lobes. Small right pleural effusion. No pneumothorax. Upper Abdomen: 2 low-density lesions in the liver are indeterminate but statistically likely to represent benign cysts or hemangiomas. No follow-up is recommended if patient is low risk for [facility] and has no known malignancy. No acute abnormality. Musculoskeletal: No acute fracture. Review of the MIP images confirms the above findings. IMPRESSION: 1. Respiratory motion obscures the segmental and subsegmental arteries. No central pulmonary embolism. 2. Pulmonary findings compatible with multifocal pneumonia. Follow-up in 6-8 weeks after treatment is recommended to ensure resolution. 3. Small right pleural effusion. 4. Enlarged pretracheal node is likely reactive. Continued attention on follow-up. Aortic Atherosclerosis (ICD10-I70.0). Electronically Signed   By: Minerva Fester M.D.   On: 02/18/2023 02:56   CT Head Wo Contrast  Result Date:  02/18/2023 CLINICAL DATA:  Mental status change EXAM: CT HEAD WITHOUT CONTRAST TECHNIQUE: Contiguous axial images were obtained from the base of the skull through the vertex without intravenous contrast. RADIATION DOSE REDUCTION: This exam was performed according to the departmental dose-optimization program which includes automated exposure control, adjustment of the mA and/or kV according to patient size and/or use of iterative reconstruction technique. COMPARISON:  None Available. FINDINGS: Brain: No intracranial hemorrhage, mass effect, or evidence of acute infarct. No hydrocephalus. No extra-axial fluid collection. Vascular: No hyperdense vessel or unexpected calcification. Skull: No fracture or focal lesion. Sinuses/Orbits: No acute finding. Mucosal thickening about the left maxillary sinus ethmoid air cells. No mastoid effusion. Other: None. IMPRESSION: No acute intracranial abnormality. Electronically Signed   By: Minerva Fester M.D.   On: 02/18/2023 02:44   DG Chest 2 View  Result Date: 02/17/2023 CLINICAL DATA:  Chest pain, right-sided rib pain EXAM: CHEST - 2 VIEW COMPARISON:  09/27/2016 FINDINGS: Single frontal view of the chest demonstrates an unremarkable cardiac silhouette. There is patchy airspace disease within the right upper and right lower lobe, consistent with multifocal bronchopneumonia. Small right parapneumonic effusion. No pneumothorax. No acute bony abnormalities. IMPRESSION: 1. Multifocal right-sided bronchopneumonia and small parapneumonic effusion. Electronically Signed   By: Sharlet Salina M.D.   On: 02/17/2023 22:12         Assessment and Plan: #Community Acquired vs Aspiration Pneumonia  #Uncomplicated Parapneumonic Pleural Effusion Pt presents with cough and shortness of breath for ~4 days and worsened yesterday per daughter. CXR findings of patchy bilateral consolidative opacities and bowing of posterior trachea concerning for aspiration pneumonia. Based on the  patient's pneumonia severity index score of 94 the patient is of moderate risk and merits inpatient treatment.The patient received empiric Rocephin and Metronidazole in the ED.  - Continue empiric Rocephin and Metronidazole - Lower respiratory culture - Respiratory pathogen panel - Legionella and Strep pneumoniae urine studies - PRN nebulizers - Mucinex -  Incentive Spirometry - Daily CBC, BMP - NPO pending Swallow screen  #Abdominal Pain #Back Pain Patient reporting (R) 7/10 back pain that radiates to her mid abdomen. Has been taking Tramadol for a few days with minimal relief. Reports pain score decreases from 7-->5/10 after taking tramadol. Back and abd pain possible anginal equivalent but Troponin negative x2.  - CT Abdomen Pelvis - PRN analgesia - PRN antiemetic - Monitor on telemetry x 24 hours  #Subconjunctival Hemorrhage Patient and daughter reports increased coughing over the past few days, BP elevated in the ED SBP 140s-150s - Expect to self resolve in ~14 days - Supportive care as needed - Management of BP as below - HGB A1C with AM labs  #Elevated Blood Pressure Patient reports she does not have a history of hypertension and does not take antihypertensives at home. - Pain management - Consider starting ACE/ARB if BP not controlled with pain management  #Diabetes -Hold home metformin - AC/HS CBG monitoring  #Hyperlipidemia -Continue home Atorvastatin  VTE prophylaxis: Lovenox GI prophylaxis: Protonix Diet: NPO pending Swallow eval Access: PIV Lines: NONE Code Status: FULL Telemetry: Yes Disposition: Admit to Telemetry Medical  Advance Care Planning: Code Status: FULL  Consults: N/A  Family Communication: Daughter at bedside  Severity of Illness: The appropriate patient status for this patient is INPATIENT. Inpatient status is judged to be reasonable and necessary in order to provide the required intensity of service to ensure the patient's safety. The  patient's presenting symptoms, physical exam findings, and initial radiographic and laboratory data in the context of their chronic comorbidities is felt to place them at high risk for further clinical deterioration. Furthermore, it is not anticipated that the patient will be medically stable for discharge from the hospital within 2 midnights of admission.   * I certify that at the point of admission it is my clinical judgment that the patient will require inpatient hospital care spanning beyond 2 midnights from the point of admission due to high intensity of service, high risk for further deterioration and high frequency of surveillance required.*  To reach the provider On-Call:   7AM- 7PM see care teams to locate the attending and reach out to them via [email or web address] Password: [email or web address] 7PM-7AM contact night-coverage If you still have difficulty reaching the appropriate provider, please page the [facility] (Director on Call) for [facility] on amion for assistance  This document was prepared using Conservation officer, historic buildings and may include unintentional dictation errors.  Bishop Limbo FNP-BC, PMHNP-BC Nurse Practitioner [facility]

## 2023-02-18 NOTE — Evaluation (Signed)
Clinical/Bedside Swallow Evaluation Patient Details  Name: [name] MRN: [id number] Date of Birth: Aug 21, 1950  Today's Date: 02/18/2023 Time: SLP Start Time (ACUTE ONLY): 1539 SLP Stop Time (ACUTE ONLY): 1601 SLP Time Calculation (min) (ACUTE ONLY): 22 min  Past Medical History:  Past Medical History:  Diagnosis Date   Back pain    Diabetes mellitus without complication (HCC)    Type II   GERD (gastroesophageal reflux disease)    ocassional --takes tums   History of blood transfusion    with knee surgeries   Hyperlipemia    Joint pain    Past Surgical History:  Past Surgical History:  Procedure Laterality Date   ABDOMINAL HYSTERECTOMY     BREAST BIOPSY Left 09/10/2022   MM LT BREAST BX W LOC DEV 1ST LESION IMAGE BX SPEC STEREO GUIDE 09/10/2022 [facility] MAMMOGRAPHY   CHOLECYSTECTOMY     COLONOSCOPY     COLONOSCOPY WITH PROPOFOL N/A 08/26/2017   Procedure: COLONOSCOPY WITH PROPOFOL;  Surgeon: Graylin Shiver, MD;  Location: [facility] ENDOSCOPY;  Service: Endoscopy;  Laterality: N/A;   LEG SURGERY Right    fracture   REPLACEMENT TOTAL KNEE Right    "5"- patient reports   HPI:  Pt is a 72 yo female presenting 9/17 with AMS as well as worsening back pain, cough, and shortness of breath. Found to have multifocal PNA. CT Chest showed bowing of the posterior trachea compatible with aspiration so swallow eval was ordered. PMH includes: GERD, DM, HLD, back pain    Assessment / Plan / Recommendation  Clinical Impression  Pt's oropharyngeal swallow appears to be [facility] with no overt signs of dysphagia or aspiration observed across challenging that included drinking three ounces of water consecutively. Pt and daughter deny any difficulties with swallowing in the past, and pt says that although she has had reflux in the past, she does not think she has experienced any symptoms for at least a year. Education was provided about concern for aspiration on other imaging and MBS was offered for a more thorough  look at oropharyngeal function, but she and her daughter politely decline any SLP f/u at this time. Will start regular solids and thin liquids. Education was provided about aspiration precautions. Please reorder if needed. SLP Visit Diagnosis: Dysphagia, unspecified (R13.10)    Aspiration Risk  Mild aspiration risk    Diet Recommendation Regular;Thin liquid    Liquid Administration via: Cup;Straw Medication Administration: Whole meds with liquid Supervision: Patient able to self feed Compensations: Slow rate;Small sips/bites Postural Changes: Seated upright at 90 degrees    Other  Recommendations Oral Care Recommendations: Oral care BID    Recommendations for follow up therapy are one component of a multi-disciplinary discharge planning process, led by the attending physician.  Recommendations may be updated based on patient status, additional functional criteria and insurance authorization.  Follow up Recommendations No SLP follow up      Assistance Recommended at Discharge    Functional Status Assessment Patient has not had a recent decline in their functional status  Frequency and Duration            Prognosis        Swallow Study   General HPI: Pt is a 72 yo female presenting 9/17 with AMS as well as worsening back pain, cough, and shortness of breath. Found to have multifocal PNA. CT Chest showed bowing of the posterior trachea compatible with aspiration so swallow eval was ordered. PMH includes: GERD, DM, HLD, back pain  Type of Study: Bedside Swallow Evaluation Previous Swallow Assessment: none in chart Diet Prior to this Study: NPO Temperature Spikes Noted: No Respiratory Status: Room air History of Recent Intubation: No Behavior/Cognition: Alert;Cooperative;Pleasant mood Oral Cavity Assessment: Within Functional Limits Oral Care Completed by SLP: No Oral Cavity - Dentition: Dentures, top;Dentures, bottom (partials bottom) Vision: Functional for  self-feeding Self-Feeding Abilities: Able to feed self Patient Positioning: Upright in bed Baseline Vocal Quality: Normal Volitional Cough: Strong Volitional Swallow: Able to elicit    Oral/Motor/Sensory Function Overall Oral Motor/Sensory Function: Within functional limits   Ice Chips Ice chips: Not tested   Thin Liquid Thin Liquid: Within functional limits Presentation: Cup;Self Fed;Straw    Nectar Thick Nectar Thick Liquid: Not tested   Honey Thick Honey Thick Liquid: Not tested   Puree Puree: Within functional limits Presentation: Self Fed;Spoon   Solid     Solid: Within functional limits Presentation: Self Fed      Mahala Menghini., M.A. CCC-SLP Acute Rehabilitation Services Office [phone]  Secure chat preferred  02/18/2023,4:16 PM

## 2023-02-19 DIAGNOSIS — J189 Pneumonia, unspecified organism: Secondary | ICD-10-CM | POA: Diagnosis not present

## 2023-02-19 LAB — GLUCOSE, CAPILLARY
Glucose-Capillary: 102 mg/dL — ABNORMAL HIGH (ref 70–99)
Glucose-Capillary: 125 mg/dL — ABNORMAL HIGH (ref 70–99)
Glucose-Capillary: 121 mg/dL — ABNORMAL HIGH (ref 70–99)
Glucose-Capillary: 123 mg/dL — ABNORMAL HIGH (ref 70–99)

## 2023-02-19 MED ORDER — POTASSIUM CHLORIDE CRYS ER 20 MEQ PO TBCR
40.0000 meq | EXTENDED_RELEASE_TABLET | Freq: Once | ORAL | Status: AC
  Administered 2023-02-19: 40 meq via ORAL
  Filled 2023-02-19: qty 2

## 2023-02-19 MED ORDER — HYDROCOD POLI-CHLORPHE POLI ER 10-8 MG/5ML PO SUER
5.0000 mL | Freq: Two times a day (BID) | ORAL | Status: DC
  Administered 2023-02-19 – 2023-02-20 (×3): 5 mL via ORAL
  Filled 2023-02-19 (×3): qty 5

## 2023-02-19 NOTE — Progress Notes (Signed)
PROGRESS NOTE  [name]  FAO:[id number] DOB: Oct 05, 1950 DOA: 02/17/2023 PCP: Merri Brunette, MD   Brief Narrative: Patient is a 42 female with history of diabetes type 2, hyperlipidemia who presented with back pain, altered mentation, lethargy, shortness of breath.  On presentation, she was in sinus tachycardia, afebrile.  COVID test came out  to be nonreactive.  CT negative for acute findings.  Chest x-ray/CT chest showed multifocal right sided  pneumonia,small parapneumonic effusion.  Concern for aspiration pneumonia.  CT PE negative for PE/aortic dissection.  Patient was started on antibiotics  Assessment & Plan:  Principal Problem:   Pneumonia Active Problems:   Controlled type 2 diabetes mellitus without complication, without long-term current use of insulin (HCC)   Hyperlipidemia   Back pain   Conjunctival hemorrhage of left eye   Elevated BP without diagnosis of hypertension   Abdominal pain  Community-acquired pneumonia: Presented with cough, shortness of breath.  CT  showed bilateral patchy consolidative opacities.  Concern for aspiration pneumonia.  This morning she remains on room air.  Respiratory viral panel negative.  Continue bronchodilators as needed.  Speech therapy saw the patient and cleared for regular diet.  Will change antibiotics to oral tomorrow before discharge  Subconjunctival hemorrhage: Due to coughing.  Expect to resolve spontaneously  Hyperlipidemia: On Lipitor  Back pain: No acute findings as per CT abdomen/pelvis, currently resolved  Diabetes type 2: On metformin at home.  Currently, she is on sliding scale  Hypertension: She was noticed to be hypertensive on 9/17, blood pressure been better today.  Not on  medication at home  Hypokalemia: Supplemented with potassium      DVT prophylaxis:enoxaparin (LOVENOX) injection 40 mg Start: 02/18/23 1000     Code Status: Full Code  Family Communication:Daughter at bedside  Patient  status:Inpatient  Patient is from :Home  Anticipated discharge ON:GEXB  Estimated DC date:tomorrow   Consultants: None  Procedures:None  Antimicrobials:  Anti-infectives (From admission, onward)    Start     Dose/Rate Route Frequency Ordered Stop   02/19/23 0800  cefTRIAXone (ROCEPHIN) 2 g in sodium chloride 0.9 % 100 mL IVPB        2 g 200 mL/hr over 30 Minutes Intravenous Every 24 hours 02/18/23 0812 02/23/23 0759   02/19/23 0800  azithromycin (ZITHROMAX) 500 mg in sodium chloride 0.9 % 250 mL IVPB        500 mg 250 mL/hr over 60 Minutes Intravenous Every 24 hours 02/18/23 0812 02/23/23 0759   02/18/23 0145  cefTRIAXone (ROCEPHIN) 1 g in sodium chloride 0.9 % 100 mL IVPB        1 g 200 mL/hr over 30 Minutes Intravenous  Once 02/18/23 0130 02/18/23 0310   02/18/23 0145  azithromycin (ZITHROMAX) 500 mg in sodium chloride 0.9 % 250 mL IVPB        500 mg 250 mL/hr over 60 Minutes Intravenous  Once 02/18/23 0130 02/18/23 2841       Subjective: Patient seen and examined at bedside today.  Hemodynamically stable.  Looks very comfortable today, on room air.  Still bothered by dry cough and has pain on the right chest when coughing.  Daughter at the bedside.  She does not feel ready to go home today.  Objective: Vitals:   02/18/23 1950 02/19/23 0030 02/19/23 0430 02/19/23 0731  BP: 137/64 (!) 142/83 121/60 (!) 144/72  Pulse: 98 (!) 110 99 95  Resp: 18 18 18    Temp: 98.9 F (37.2 C) 99  F (37.2 C) 99.2 F (37.3 C) 98.4 F (36.9 C)  TempSrc: Oral Oral Oral Oral  SpO2: (!) 87% 91% 94% 95%   No intake or output data in the 24 hours ending 02/19/23 0950 There were no vitals filed for this visit.  Examination:  General exam: Overall comfortable, not in distress, obese HEENT: PERRL Respiratory system:  no wheezes, mild bibasilar crackles Cardiovascular system: S1 & S2 heard, RRR.  Gastrointestinal system: Abdomen is nondistended, soft and nontender. Central nervous  system: Alert and oriented Extremities: No edema, no clubbing ,no cyanosis Skin: No rashes, no ulcers,no icterus     Data Reviewed: I have personally reviewed following labs and imaging studies  CBC: Recent Labs  Lab 02/17/23 2050 02/19/23 0735  WBC 10.4 9.4  HGB 11.6* 10.5*  HCT 38.7 35.2*  MCV 85.2 85.9  PLT 360 334   Basic Metabolic Panel: Recent Labs  Lab 02/17/23 2050 02/19/23 0735  NA 137 136  K 3.8 3.4*  CL 103 103  CO2 24 25  GLUCOSE 111* 103*  BUN 13 9  CREATININE 0.90 0.91  CALCIUM 9.6 8.8*     Recent Results (from the past 240 hour(s))  SARS Coronavirus 2 by RT PCR (hospital order, performed in [facility] hospital lab) *cepheid single result test* Anterior Nasal Swab     Status: None   Collection Time: 02/18/23  1:36 AM   Specimen: Anterior Nasal Swab  Result Value Ref Range Status   SARS Coronavirus 2 by RT PCR NEGATIVE NEGATIVE Final    Comment: Performed at [facility], [address] [place], [place] [address]  Culture, blood (routine x 2)     Status: None (Preliminary result)   Collection Time: 02/18/23  2:08 AM   Specimen: BLOOD LEFT HAND  Result Value Ref Range Status   Specimen Description BLOOD LEFT HAND  Final   Special Requests   Final    BOTTLES DRAWN AEROBIC AND ANAEROBIC Blood Culture adequate volume   Culture   Final    NO GROWTH < 12 HOURS Performed at [facility], [address], [place], [place] [address]    Report Status PENDING  Incomplete  Culture, blood (routine x 2)     Status: None (Preliminary result)   Collection Time: 02/18/23  2:08 AM   Specimen: BLOOD RIGHT HAND  Result Value Ref Range Status   Specimen Description BLOOD RIGHT HAND  Final   Special Requests   Final    BOTTLES DRAWN AEROBIC AND ANAEROBIC Blood Culture adequate volume   Culture   Final    NO GROWTH < 12 HOURS Performed at [facility], [address], [place], [place] [address]    Report Status PENDING  Incomplete  Respiratory  (~20 pathogens) panel by PCR     Status: None   Collection Time: 02/18/23  8:13 AM   Specimen: Nasopharyngeal Swab; Respiratory  Result Value Ref Range Status   Adenovirus NOT DETECTED NOT DETECTED Final   Coronavirus 229E NOT DETECTED NOT DETECTED Final    Comment: (NOTE) The Coronavirus on the Respiratory Panel, DOES NOT test for the novel  Coronavirus (2019 nCoV)    Coronavirus HKU1 NOT DETECTED NOT DETECTED Final   Coronavirus NL63 NOT DETECTED NOT DETECTED Final   Coronavirus OC43 NOT DETECTED NOT DETECTED Final   Metapneumovirus NOT DETECTED NOT DETECTED Final   Rhinovirus / Enterovirus NOT DETECTED NOT DETECTED Final   Influenza A NOT DETECTED NOT DETECTED  Final   Influenza B NOT DETECTED NOT DETECTED Final   Parainfluenza Virus 1 NOT DETECTED NOT DETECTED Final   Parainfluenza Virus 2 NOT DETECTED NOT DETECTED Final   Parainfluenza Virus 3 NOT DETECTED NOT DETECTED Final   Parainfluenza Virus 4 NOT DETECTED NOT DETECTED Final   Respiratory Syncytial Virus NOT DETECTED NOT DETECTED Final   Bordetella pertussis NOT DETECTED NOT DETECTED Final   Bordetella Parapertussis NOT DETECTED NOT DETECTED Final   Chlamydophila pneumoniae NOT DETECTED NOT DETECTED Final   Mycoplasma pneumoniae NOT DETECTED NOT DETECTED Final    Comment: Performed at [facility], [address] [place], [place] [address]     Radiology Studies: CT ABDOMEN PELVIS WO CONTRAST  Result Date: 02/18/2023 CLINICAL DATA:  Acute non localized abdominal pain. EXAM: CT ABDOMEN AND PELVIS WITHOUT CONTRAST TECHNIQUE: Multidetector CT imaging of the abdomen and pelvis was performed following the standard protocol without IV contrast. RADIATION DOSE REDUCTION: This exam was performed according to the departmental dose-optimization program which includes automated exposure control, adjustment of the mA and/or kV according to patient size and/or use of iterative reconstruction technique. COMPARISON:  None Available.  FINDINGS: Lower chest: Small right pleural effusion. Hepatobiliary: No suspicious mass visualized on this unenhanced exam. Probable sub-centimeter cyst in anterior right hepatic lobe. Prior cholecystectomy. No evidence of biliary obstruction. Pancreas: No mass or inflammatory process visualized on this unenhanced exam. Spleen:  Within normal limits in size. 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 No evidence of urolithiasis or hydronephrosis. Small amount of contrast in renal collecting systems and contrast filling the urinary bladder, attributable to recent chest CTA. Stomach/Bowel: No evidence of obstruction, inflammatory process, or abnormal fluid collections. Normal appendix visualized. Vascular/Lymphatic: No pathologically enlarged lymph nodes identified. No evidence of abdominal aortic aneurysm. Reproductive:  No mass or other significant abnormality. Other: Small midline epigastric and umbilical hernias are seen, which contain only fat. Musculoskeletal:  No suspicious bone lesions identified. IMPRESSION: No acute findings within the abdomen or pelvis. Small midline epigastric and umbilical abdominal wall hernias, which contain only fat. Small right pleural effusion. Electronically Signed   By: Danae Orleans M.D.   On: 02/18/2023 14:01   CT Angio Chest PE W and/or Wo Contrast  Result Date: 02/18/2023 CLINICAL DATA:  Back pain worsening over the last month. Worsening with coughing or breathing. Increased confusion and lethargy. EXAM: CT ANGIOGRAPHY CHEST WITH CONTRAST TECHNIQUE: Multidetector CT imaging of the chest was performed using the standard protocol during bolus administration of intravenous contrast. Multiplanar CT image reconstructions and MIPs were obtained to evaluate the vascular anatomy. RADIATION DOSE REDUCTION: This exam was performed according to the departmental  dose-optimization program which includes automated exposure control, adjustment of the mA and/or kV according to patient size and/or use of iterative reconstruction technique. CONTRAST:  75mL OMNIPAQUE IOHEXOL 350 MG/ML SOLN COMPARISON:  Radiograph 02/17/2023 FINDINGS: Cardiovascular: Respiratory motion obscures the segmental and subsegmental arteries. No central pulmonary embolism. No pericardial effusion. Normal caliber thoracic aorta without dissection. Coronary artery and aortic atherosclerotic calcification. Mediastinum/Nodes: Bowing of posterior trachea compatible with aspiration. The esophagus is unremarkable. 1.1 cm right pretracheal node on 6/36. Lungs/Pleura: Patchy bilateral consolidative opacities with associated interlobular septal thickening greatest in the upper lobes. Small right pleural effusion. No pneumothorax. Upper Abdomen: 2 low-density lesions in the liver are indeterminate but statistically likely to represent benign cysts or hemangiomas. No follow-up is recommended if  patient is low risk for [facility] and has no known malignancy. No acute abnormality. Musculoskeletal: No acute fracture. Review of the MIP images confirms the above findings. IMPRESSION: 1. Respiratory motion obscures the segmental and subsegmental arteries. No central pulmonary embolism. 2. Pulmonary findings compatible with multifocal pneumonia. Follow-up in 6-8 weeks after treatment is recommended to ensure resolution. 3. Small right pleural effusion. 4. Enlarged pretracheal node is likely reactive. Continued attention on follow-up. Aortic Atherosclerosis (ICD10-I70.0). Electronically Signed   By: Minerva Fester M.D.   On: 02/18/2023 02:56   CT Head Wo Contrast  Result Date: 02/18/2023 CLINICAL DATA:  Mental status change EXAM: CT HEAD WITHOUT CONTRAST TECHNIQUE: Contiguous axial images were obtained from the base of the skull through the vertex without intravenous contrast. RADIATION DOSE REDUCTION: This exam was performed  according to the departmental dose-optimization program which includes automated exposure control, adjustment of the mA and/or kV according to patient size and/or use of iterative reconstruction technique. COMPARISON:  None Available. FINDINGS: Brain: No intracranial hemorrhage, mass effect, or evidence of acute infarct. No hydrocephalus. No extra-axial fluid collection. Vascular: No hyperdense vessel or unexpected calcification. Skull: No fracture or focal lesion. Sinuses/Orbits: No acute finding. Mucosal thickening about the left maxillary sinus ethmoid air cells. No mastoid effusion. Other: None. IMPRESSION: No acute intracranial abnormality. Electronically Signed   By: Minerva Fester M.D.   On: 02/18/2023 02:44   DG Chest 2 View  Result Date: 02/17/2023 CLINICAL DATA:  Chest pain, right-sided rib pain EXAM: CHEST - 2 VIEW COMPARISON:  09/27/2016 FINDINGS: Single frontal view of the chest demonstrates an unremarkable cardiac silhouette. There is patchy airspace disease within the right upper and right lower lobe, consistent with multifocal bronchopneumonia. Small right parapneumonic effusion. No pneumothorax. No acute bony abnormalities. IMPRESSION: 1. Multifocal right-sided bronchopneumonia and small parapneumonic effusion. Electronically Signed   By: Sharlet Salina M.D.   On: 02/17/2023 22:12    Scheduled Meds:  aspirin EC  81 mg Oral Daily   atorvastatin  20 mg Oral QHS   enoxaparin (LOVENOX) injection  40 mg Subcutaneous Q24H   guaiFENesin  600 mg Oral BID   lidocaine  1 patch Transdermal Q24H   pantoprazole  40 mg Oral Daily   Continuous Infusions:  azithromycin     cefTRIAXone (ROCEPHIN)  IV 2 g (02/19/23 0858)     LOS: 1 day   Burnadette Pop, MD [facility] P9/18/2024, 9:50 AM

## 2023-02-19 NOTE — Plan of Care (Signed)
Problem: Activity: Goal: Ability to tolerate increased activity will improve Outcome: Not Progressing   Problem: Clinical Measurements: Goal: Ability to maintain a body temperature in the normal range will improve Outcome: Not Progressing   Problem: Respiratory: Goal: Ability to maintain adequate ventilation will improve Outcome: Not Progressing Goal: Ability to maintain a clear airway will improve Outcome: Not Progressing   Problem: Education: Goal: Knowledge of General Education information will improve Description: Including pain rating scale, medication(s)/side effects and non-pharmacologic comfort measures Outcome: Not Progressing   Problem: Health Behavior/Discharge Planning: Goal: Ability to manage health-related needs will improve Outcome: Not Progressing   Problem: Clinical Measurements: Goal: Ability to maintain clinical measurements within normal limits will improve Outcome: Not Progressing Goal: Will remain free from infection Outcome: Not Progressing Goal: Diagnostic test results will improve Outcome: Not Progressing Goal: Respiratory complications will improve Outcome: Not Progressing Goal: Cardiovascular complication will be avoided Outcome: Not Progressing   Problem: Activity: Goal: Risk for activity intolerance will decrease Outcome: Not Progressing   Problem: Nutrition: Goal: Adequate nutrition will be maintained Outcome: Not Progressing   Problem: Coping: Goal: Level of anxiety will decrease Outcome: Not Progressing   Problem: Elimination: Goal: Will not experience complications related to bowel motility Outcome: Not Progressing Goal: Will not experience complications related to urinary retention Outcome: Not Progressing   Problem: Pain Managment: Goal: General experience of comfort will improve Outcome: Not Progressing   Problem: Safety: Goal: Ability to remain free from injury will improve Outcome: Not Progressing   Problem: Skin  Integrity: Goal: Risk for impaired skin integrity will decrease Outcome: Not Progressing

## 2023-02-19 NOTE — Plan of Care (Signed)

## 2023-02-20 ENCOUNTER — Other Ambulatory Visit (HOSPITAL_COMMUNITY): Payer: Self-pay

## 2023-02-20 DIAGNOSIS — J189 Pneumonia, unspecified organism: Secondary | ICD-10-CM | POA: Diagnosis not present

## 2023-02-20 LAB — GLUCOSE, CAPILLARY
Glucose-Capillary: 89 mg/dL (ref 70–99)
Glucose-Capillary: 90 mg/dL (ref 70–99)

## 2023-02-20 LAB — LEGIONELLA PNEUMOPHILA SEROGP 1 UR AG: L. pneumophila Serogp 1 Ur Ag: NEGATIVE

## 2023-02-20 MED ORDER — GUAIFENESIN ER 600 MG PO TB12
600.0000 mg | ORAL_TABLET | Freq: Two times a day (BID) | ORAL | 0 refills | Status: AC
  Filled 2023-02-20: qty 10, 5d supply, fill #0

## 2023-02-20 MED ORDER — METHOCARBAMOL 750 MG PO TABS
750.0000 mg | ORAL_TABLET | Freq: Four times a day (QID) | ORAL | 0 refills | Status: AC | PRN
  Filled 2023-02-20: qty 20, 5d supply, fill #0

## 2023-02-20 MED ORDER — PANTOPRAZOLE SODIUM 40 MG PO TBEC
40.0000 mg | DELAYED_RELEASE_TABLET | Freq: Every day | ORAL | 0 refills | Status: AC
  Filled 2023-02-20: qty 14, 14d supply, fill #0

## 2023-02-20 MED ORDER — CEFDINIR 300 MG PO CAPS
300.0000 mg | ORAL_CAPSULE | Freq: Two times a day (BID) | ORAL | 0 refills | Status: AC
  Filled 2023-02-20: qty 4, 2d supply, fill #0

## 2023-02-20 MED ORDER — AZITHROMYCIN 250 MG PO TABS
250.0000 mg | ORAL_TABLET | Freq: Every day | ORAL | 0 refills | Status: AC
Start: 2023-02-21 — End: 2023-02-23
  Filled 2023-02-20: qty 2, 2d supply, fill #0

## 2023-02-20 MED ORDER — HYDROCOD POLI-CHLORPHE POLI ER 10-8 MG/5ML PO SUER
5.0000 mL | Freq: Two times a day (BID) | ORAL | 0 refills | Status: AC
  Filled 2023-02-20: qty 50, 5d supply, fill #0

## 2023-02-20 NOTE — Discharge Summary (Signed)
Physician Discharge Summary  [name] BJY:[id number] DOB: Sep 20, 1950 DOA: 02/17/2023  PCP: Merri Brunette, MD  Admit date: 02/17/2023 Discharge date: 02/20/2023  Admitted From: Home Disposition:  Home  Discharge Condition:Stable CODE STATUS:FULL Diet recommendation: Carb Modified    Brief/Interim Summary: Patient is a 17 female with history of diabetes type 2, hyperlipidemia who presented with back pain, altered mentation, lethargy, shortness of breath.  On presentation, she was in sinus tachycardia, afebrile.  COVID test came out  to be nonreactive.  CT head was negative for acute findings.  Chest x-ray/CT chest showed multifocal right sided  pneumonia,small parapneumonic effusion.  Concern for aspiration pneumonia.  Patient was started on antibiotics .  Significantly improved clinically now.  Remains on room air.  Denies any worsening shortness of breath or cough.  Medically stable for discharge home with oral antibiotics.  Following problems were addressed during the hospitalization:  Community-acquired pneumonia: Presented with cough, shortness of breath.  CT  showed bilateral patchy consolidative opacities.  Concern for aspiration pneumonia.  This morning she remains on room air.  Respiratory viral panel negative.  Continue bronchodilators as needed.  Speech therapy saw the patient and cleared for regular diet.  Changed  antibiotics to oral before discharge   Subconjunctival hemorrhage: Due to coughing.  Expect to resolve spontaneously   Hyperlipidemia: On Lipitor   Back pain: No acute findings as per CT abdomen/pelvis.  She follows with her primary care physician for this and recently had an MRI.  We recommend to follow-up with her PCP.  Ordered Robaxin for discharge.   Diabetes type 2: On metformin at home. A1C of 6.3   Hypertension:  Not on  medication at home.  Current blood pressure stable.  Will recommend to follow-up with PCP and monitor blood pressure at home.    Hypokalemia: Supplemented with potassium   Discharge Diagnoses:  Principal Problem:   Pneumonia Active Problems:   Controlled type 2 diabetes mellitus without complication, without long-term current use of insulin (HCC)   Hyperlipidemia   Back pain   Conjunctival hemorrhage of left eye   Elevated BP without diagnosis of hypertension   Abdominal pain    Discharge Instructions  Discharge Instructions     Diet Carb Modified   Complete by: As directed    Discharge instructions   Complete by: As directed    1)Please take your medications as instructed 2)Follow up with your PCP in a week.   Increase activity slowly   Complete by: As directed       Allergies as of 02/20/2023       Reactions   Codeine Itching   Morphine Itching   Sulindac Itching   Burnt spots        Medication List     TAKE these medications    Accu-Chek FastClix Lancets Misc   alendronate 70 MG tablet Commonly known as: FOSAMAX Take 70 mg by mouth once a week. Monday   aspirin EC 81 MG tablet Take 81 mg by mouth daily.   atorvastatin 20 MG tablet Commonly known as: LIPITOR TAKE ONE TABLET BY MOUTH AT BEDTIME   azithromycin 250 MG tablet Commonly known as: Zithromax Z-Pak Take 1 tablet (250 mg total) by mouth daily for 2 days. Take 2 tablets (500 mg) on  Day 1,  followed by 1 tablet (250 mg) once daily on Days 2 through 5. Start taking on: February 21, 2023   CALCIUM 600/VITAMIN D3 PO Take 1 tablet by mouth daily.  cefdinir 300 MG capsule Commonly known as: OMNICEF Take 1 capsule (300 mg total) by mouth 2 (two) times daily for 2 days. Start taking on: February 21, 2023   chlorpheniramine-HYDROcodone 10-8 MG/5ML Commonly known as: TUSSIONEX Take 5 mLs by mouth every 12 (twelve) hours for 5 days.   cholecalciferol 25 MCG (1000 UNIT) tablet Commonly known as: VITAMIN D3 Take 1,000 Units by mouth daily.   COMPLETE MULTIVITAMIN/MINERAL PO Take 1 tablet by mouth daily.   FISH  OIL PO Take 1 capsule by mouth in the morning and at bedtime.   furosemide 40 MG tablet Commonly known as: LASIX Take 40 mg by mouth See admin instructions. Take 1 tablet by mouth 2 to 3 times a week   guaiFENesin 600 MG 12 hr tablet Commonly known as: MUCINEX Take 1 tablet (600 mg total) by mouth 2 (two) times daily for 5 days.   metFORMIN 500 MG tablet Commonly known as: GLUCOPHAGE Take 1 tablet (500 mg total) by mouth 2 (two) times daily with a meal. What changed: how much to take   methocarbamol 750 MG tablet Commonly known as: Robaxin-750 Take 1 tablet (750 mg total) by mouth every 6 (six) hours as needed for muscle spasms (back pain).   Ozempic (0.25 or 0.5 MG/DOSE) 2 MG/1.5ML Sopn Generic drug: Semaglutide(0.25 or 0.5MG /DOS) Inject 0.5 mg into the skin once a week. Sunday   pantoprazole 40 MG tablet Commonly known as: PROTONIX Take 1 tablet (40 mg total) by mouth daily. Start taking on: February 21, 2023   traMADol 50 MG tablet Commonly known as: ULTRAM Take 50 mg by mouth 3 (three) times daily as needed.        Follow-up Information     Merri Brunette, MD. Schedule an appointment as soon as possible for a visit in 1 week(s).   Specialty: Family Medicine Contact information: [phone] [place] [address] [phone]                Allergies  Allergen Reactions   Codeine Itching   Morphine Itching   Sulindac Itching    Burnt spots    Consultations: None  Procedures/Studies: CT ABDOMEN PELVIS WO CONTRAST  Result Date: 02/18/2023 CLINICAL DATA:  Acute non localized abdominal pain. EXAM: CT ABDOMEN AND PELVIS WITHOUT CONTRAST TECHNIQUE: Multidetector CT imaging of the abdomen and pelvis was performed following the standard protocol without IV contrast. RADIATION DOSE REDUCTION: This exam was performed according to the departmental dose-optimization program which includes automated exposure control, adjustment of the mA  and/or kV according to patient size and/or use of iterative reconstruction technique. COMPARISON:  None Available. FINDINGS: Lower chest: Small right pleural effusion. Hepatobiliary: No suspicious mass visualized on this unenhanced exam. Probable sub-centimeter cyst in anterior right hepatic lobe. Prior cholecystectomy. No evidence of biliary obstruction. Pancreas: No mass or inflammatory process visualized on this unenhanced exam. Spleen:  Within normal limits in size. 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 No evidence of urolithiasis or hydronephrosis. Small amount of contrast in renal collecting systems and contrast filling the urinary bladder, attributable to recent chest CTA. Stomach/Bowel: No evidence of obstruction, inflammatory process, or abnormal fluid collections. Normal appendix visualized. Vascular/Lymphatic: No pathologically enlarged lymph nodes identified. No evidence of abdominal aortic aneurysm. Reproductive:  No mass or other significant abnormality. Other: Small midline epigastric and umbilical hernias are seen, which contain only fat. Musculoskeletal:  No  suspicious bone lesions identified. IMPRESSION: No acute findings within the abdomen or pelvis. Small midline epigastric and umbilical abdominal wall hernias, which contain only fat. Small right pleural effusion. Electronically Signed   By: Danae Orleans M.D.   On: 02/18/2023 14:01   CT Angio Chest PE W and/or Wo Contrast  Result Date: 02/18/2023 CLINICAL DATA:  Back pain worsening over the last month. Worsening with coughing or breathing. Increased confusion and lethargy. EXAM: CT ANGIOGRAPHY CHEST WITH CONTRAST TECHNIQUE: Multidetector CT imaging of the chest was performed using the standard protocol during bolus administration of intravenous contrast. Multiplanar CT image reconstructions and MIPs were obtained to  evaluate the vascular anatomy. RADIATION DOSE REDUCTION: This exam was performed according to the departmental dose-optimization program which includes automated exposure control, adjustment of the mA and/or kV according to patient size and/or use of iterative reconstruction technique. CONTRAST:  75mL OMNIPAQUE IOHEXOL 350 MG/ML SOLN COMPARISON:  Radiograph 02/17/2023 FINDINGS: Cardiovascular: Respiratory motion obscures the segmental and subsegmental arteries. No central pulmonary embolism. No pericardial effusion. Normal caliber thoracic aorta without dissection. Coronary artery and aortic atherosclerotic calcification. Mediastinum/Nodes: Bowing of posterior trachea compatible with aspiration. The esophagus is unremarkable. 1.1 cm right pretracheal node on 6/36. Lungs/Pleura: Patchy bilateral consolidative opacities with associated interlobular septal thickening greatest in the upper lobes. Small right pleural effusion. No pneumothorax. Upper Abdomen: 2 low-density lesions in the liver are indeterminate but statistically likely to represent benign cysts or hemangiomas. No follow-up is recommended if patient is low risk for [facility] and has no known malignancy. No acute abnormality. Musculoskeletal: No acute fracture. Review of the MIP images confirms the above findings. IMPRESSION: 1. Respiratory motion obscures the segmental and subsegmental arteries. No central pulmonary embolism. 2. Pulmonary findings compatible with multifocal pneumonia. Follow-up in 6-8 weeks after treatment is recommended to ensure resolution. 3. Small right pleural effusion. 4. Enlarged pretracheal node is likely reactive. Continued attention on follow-up. Aortic Atherosclerosis (ICD10-I70.0). Electronically Signed   By: Minerva Fester M.D.   On: 02/18/2023 02:56   CT Head Wo Contrast  Result Date: 02/18/2023 CLINICAL DATA:  Mental status change EXAM: CT HEAD WITHOUT CONTRAST TECHNIQUE: Contiguous axial images were obtained from the base of  the skull through the vertex without intravenous contrast. RADIATION DOSE REDUCTION: This exam was performed according to the departmental dose-optimization program which includes automated exposure control, adjustment of the mA and/or kV according to patient size and/or use of iterative reconstruction technique. COMPARISON:  None Available. FINDINGS: Brain: No intracranial hemorrhage, mass effect, or evidence of acute infarct. No hydrocephalus. No extra-axial fluid collection. Vascular: No hyperdense vessel or unexpected calcification. Skull: No fracture or focal lesion. Sinuses/Orbits: No acute finding. Mucosal thickening about the left maxillary sinus ethmoid air cells. No mastoid effusion. Other: None. IMPRESSION: No acute intracranial abnormality. Electronically Signed   By: Minerva Fester M.D.   On: 02/18/2023 02:44   DG Chest 2 View  Result Date: 02/17/2023 CLINICAL DATA:  Chest pain, right-sided rib pain EXAM: CHEST - 2 VIEW COMPARISON:  09/27/2016 FINDINGS: Single frontal view of the chest demonstrates an unremarkable cardiac silhouette. There is patchy airspace disease within the right upper and right lower lobe, consistent with multifocal bronchopneumonia. Small right parapneumonic effusion. No pneumothorax. No acute bony abnormalities. IMPRESSION: 1. Multifocal right-sided bronchopneumonia and small parapneumonic effusion. Electronically Signed   By: Sharlet Salina M.D.   On: 02/17/2023 22:12      Subjective: Patient seen and examined at bedside today.  Hemodynamically stable.  She looks  comfortable.  Not coughing like yesterday.  She is on room air.  She feels ready to go home.  Discharge plan discussed with daughter at bedside  Discharge Exam: Vitals:   02/20/23 0433 02/20/23 0743  BP: 129/65 (!) 151/85  Pulse: 88 98  Resp: 18 18  Temp: 99 F (37.2 C) 97.7 F (36.5 C)  SpO2: 91% 95%   Vitals:   02/19/23 1418 02/19/23 1954 02/20/23 0433 02/20/23 0743  BP: (!) 146/69 130/73  129/65 (!) 151/85  Pulse: 96 95 88 98  Resp:  18 18 18   Temp: 98.4 F (36.9 C) 99.4 F (37.4 C) 99 F (37.2 C) 97.7 F (36.5 C)  TempSrc: Oral Oral Oral Oral  SpO2: 95% 98% 91% 95%  Weight:   117.5 kg     General: Pt is alert, awake, not in acute distress Cardiovascular: RRR, S1/S2 +, no rubs, no gallops Respiratory: CTA bilaterally, no wheezing, no rhonchi Abdominal: Soft, NT, ND, bowel sounds + Extremities: no edema, no cyanosis    The results of significant diagnostics from this hospitalization (including imaging, microbiology, ancillary and laboratory) are listed below for reference.     Microbiology: Recent Results (from the past 240 hour(s))  SARS Coronavirus 2 by RT PCR (hospital order, performed in [facility] hospital lab) *cepheid single result test* Anterior Nasal Swab     Status: None   Collection Time: 02/18/23  1:36 AM   Specimen: Anterior Nasal Swab  Result Value Ref Range Status   SARS Coronavirus 2 by RT PCR NEGATIVE NEGATIVE Final    Comment: Performed at [facility], [address] [place], [place] [address]  Culture, blood (routine x 2)     Status: None (Preliminary result)   Collection Time: 02/18/23  2:08 AM   Specimen: BLOOD LEFT HAND  Result Value Ref Range Status   Specimen Description BLOOD LEFT HAND  Final   Special Requests   Final    BOTTLES DRAWN AEROBIC AND ANAEROBIC Blood Culture adequate volume   Culture   Final    NO GROWTH 2 DAYS Performed at [facility], [address], [place], [place] [address]    Report Status PENDING  Incomplete  Culture, blood (routine x 2)     Status: None (Preliminary result)   Collection Time: 02/18/23  2:08 AM   Specimen: BLOOD RIGHT HAND  Result Value Ref Range Status   Specimen Description BLOOD RIGHT HAND  Final   Special Requests   Final    BOTTLES DRAWN AEROBIC AND ANAEROBIC Blood Culture adequate volume   Culture   Final    NO GROWTH 2 DAYS Performed at [facility], [address], [place], [place] [address]    Report Status PENDING  Incomplete  Respiratory (~20 pathogens) panel by PCR     Status: None   Collection Time: 02/18/23  8:13 AM   Specimen: Nasopharyngeal Swab; Respiratory  Result Value Ref Range Status   Adenovirus NOT DETECTED NOT DETECTED Final   Coronavirus 229E NOT DETECTED NOT DETECTED Final    Comment: (NOTE) The Coronavirus on the Respiratory Panel, DOES NOT test for the novel  Coronavirus (2019 nCoV)    Coronavirus HKU1 NOT DETECTED NOT DETECTED Final   Coronavirus NL63 NOT DETECTED NOT DETECTED Final   Coronavirus OC43 NOT DETECTED NOT DETECTED Final   Metapneumovirus NOT DETECTED NOT DETECTED Final   Rhinovirus / Enterovirus NOT DETECTED NOT DETECTED Final   Influenza A NOT DETECTED  NOT DETECTED Final   Influenza B NOT DETECTED NOT DETECTED Final   Parainfluenza Virus 1 NOT DETECTED NOT DETECTED Final   Parainfluenza Virus 2 NOT DETECTED NOT DETECTED Final   Parainfluenza Virus 3 NOT DETECTED NOT DETECTED Final   Parainfluenza Virus 4 NOT DETECTED NOT DETECTED Final   Respiratory Syncytial Virus NOT DETECTED NOT DETECTED Final   Bordetella pertussis NOT DETECTED NOT DETECTED Final   Bordetella Parapertussis NOT DETECTED NOT DETECTED Final   Chlamydophila pneumoniae NOT DETECTED NOT DETECTED Final   Mycoplasma pneumoniae NOT DETECTED NOT DETECTED Final    Comment: Performed at [facility], [address], [place], [place] [address]     Labs: BNP (last 3 results) No results for input(s): "BNP" in the last 8760 hours. Basic Metabolic Panel: Recent Labs  Lab 02/17/23 2050 02/19/23 0735  NA 137 136  K 3.8 3.4*  CL 103 103  CO2 24 25  GLUCOSE 111* 103*  BUN 13 9  CREATININE 0.90 0.91  CALCIUM 9.6 8.8*   Liver Function Tests: Recent Labs  Lab 02/17/23 2050  AST 18  ALT 15  ALKPHOS 104  BILITOT 0.3  PROT 7.5  ALBUMIN 3.2*   Recent Labs  Lab 02/18/23 0136  LIPASE 20   No results for input(s): "AMMONIA" in  the last 168 hours. CBC: Recent Labs  Lab 02/17/23 2050 02/19/23 0735  WBC 10.4 9.4  HGB 11.6* 10.5*  HCT 38.7 35.2*  MCV 85.2 85.9  PLT 360 334   Cardiac Enzymes: No results for input(s): "CKTOTAL", "CKMB", "CKMBINDEX", "TROPONINI" in the last 168 hours. BNP: Invalid input(s): "POCBNP" CBG: Recent Labs  Lab 02/19/23 0732 02/19/23 1201 02/19/23 1644 02/19/23 1956 02/20/23 0741  GLUCAP 102* 125* 121* 123* 89   D-Dimer No results for input(s): "DDIMER" in the last 72 hours. Hgb A1c Recent Labs    02/19/23 0735  HGBA1C 6.3*   Lipid Profile No results for input(s): "CHOL", "HDL", "LDLCALC", "TRIG", "CHOLHDL", "LDLDIRECT" in the last 72 hours. Thyroid function studies No results for input(s): "TSH", "T4TOTAL", "T3FREE", "THYROIDAB" in the last 72 hours.  Invalid input(s): "FREET3" Anemia work up No results for input(s): "VITAMINB12", "FOLATE", "FERRITIN", "TIBC", "IRON", "RETICCTPCT" in the last 72 hours. Urinalysis    Component Value Date/Time   COLORURINE YELLOW 02/18/2023 0348   APPEARANCEUR CLEAR 02/18/2023 0348   LABSPEC 1.030 02/18/2023 0348   PHURINE 5.0 02/18/2023 0348   GLUCOSEU NEGATIVE 02/18/2023 0348   HGBUR NEGATIVE 02/18/2023 0348   BILIRUBINUR NEGATIVE 02/18/2023 0348   KETONESUR 5 (A) 02/18/2023 0348   PROTEINUR NEGATIVE 02/18/2023 0348   UROBILINOGEN 0.2 12/14/2010 0930   NITRITE NEGATIVE 02/18/2023 0348   LEUKOCYTESUR NEGATIVE 02/18/2023 0348   Sepsis Labs Recent Labs  Lab 02/17/23 2050 02/19/23 0735  WBC 10.4 9.4   Microbiology Recent Results (from the past 240 hour(s))  SARS Coronavirus 2 by RT PCR (hospital order, performed in [facility] hospital lab) *cepheid single result test* Anterior Nasal Swab     Status: None   Collection Time: 02/18/23  1:36 AM   Specimen: Anterior Nasal Swab  Result Value Ref Range Status   SARS Coronavirus 2 by RT PCR NEGATIVE NEGATIVE Final    Comment: Performed at [facility], [address] [place], [place] [address]  Culture, blood (routine x 2)     Status: None (Preliminary result)   Collection Time: 02/18/23  2:08 AM   Specimen: BLOOD LEFT HAND  Result Value Ref Range  Status   Specimen Description BLOOD LEFT HAND  Final   Special Requests   Final    BOTTLES DRAWN AEROBIC AND ANAEROBIC Blood Culture adequate volume   Culture   Final    NO GROWTH 2 DAYS Performed at [facility], [address], [place], [place] [address]    Report Status PENDING  Incomplete  Culture, blood (routine x 2)     Status: None (Preliminary result)   Collection Time: 02/18/23  2:08 AM   Specimen: BLOOD RIGHT HAND  Result Value Ref Range Status   Specimen Description BLOOD RIGHT HAND  Final   Special Requests   Final    BOTTLES DRAWN AEROBIC AND ANAEROBIC Blood Culture adequate volume   Culture   Final    NO GROWTH 2 DAYS Performed at [facility], [address], [place], [place] [address]    Report Status PENDING  Incomplete  Respiratory (~20 pathogens) panel by PCR     Status: None   Collection Time: 02/18/23  8:13 AM   Specimen: Nasopharyngeal Swab; Respiratory  Result Value Ref Range Status   Adenovirus NOT DETECTED NOT DETECTED Final   Coronavirus 229E NOT DETECTED NOT DETECTED Final    Comment: (NOTE) The Coronavirus on the Respiratory Panel, DOES NOT test for the novel  Coronavirus (2019 nCoV)    Coronavirus HKU1 NOT DETECTED NOT DETECTED Final   Coronavirus NL63 NOT DETECTED NOT DETECTED Final   Coronavirus OC43 NOT DETECTED NOT DETECTED Final   Metapneumovirus NOT DETECTED NOT DETECTED Final   Rhinovirus / Enterovirus NOT DETECTED NOT DETECTED Final   Influenza A NOT DETECTED NOT DETECTED Final   Influenza B NOT DETECTED NOT DETECTED Final   Parainfluenza Virus 1 NOT DETECTED NOT DETECTED Final   Parainfluenza Virus 2 NOT DETECTED NOT DETECTED Final   Parainfluenza Virus 3 NOT DETECTED NOT DETECTED Final   Parainfluenza Virus 4 NOT DETECTED NOT DETECTED Final    Respiratory Syncytial Virus NOT DETECTED NOT DETECTED Final   Bordetella pertussis NOT DETECTED NOT DETECTED Final   Bordetella Parapertussis NOT DETECTED NOT DETECTED Final   Chlamydophila pneumoniae NOT DETECTED NOT DETECTED Final   Mycoplasma pneumoniae NOT DETECTED NOT DETECTED Final    Comment: Performed at [facility], [address] [place], [place] [address]    Please note: You were cared for by a hospitalist during your hospital stay. Once you are discharged, your primary care physician will handle any further medical issues. Please note that NO REFILLS for any discharge medications will be authorized once you are discharged, as it is imperative that you return to your primary care physician (or establish a relationship with a primary care physician if you do not have one) for your post hospital discharge needs so that they can reassess your need for medications and monitor your lab values.    Time coordinating discharge: 40 minutes  SIGNED:   Burnadette Pop, MD  [facility] 02/20/2023, 10:42 AM Pager [phone]  If 7PM-7AM, please contact night-coverage [email or web address] Password [email or web address]

## 2023-02-20 NOTE — Care Management (Signed)
Transition of Care [facility]) Screening Note   Patient Details  Name: [name] Date of Birth: June 22, 1950   Transition of Care [facility]) CM/SW Contact:    Lockie Pares, RN Phone Number: 02/20/2023, 8:22 AM    Transition of Care [facility]) has reviewed patient and no TOC needs have been identified at this time. We will continue to monitor patient advancement through interdisciplinary progression rounds. If new patient transition needs arise, please place a TOC consult.

## 2023-02-23 LAB — CULTURE, BLOOD (ROUTINE X 2)
Culture: NO GROWTH
Culture: NO GROWTH
Special Requests: ADEQUATE
Special Requests: ADEQUATE

## 2023-02-24 ENCOUNTER — Ambulatory Visit
Admission: RE | Admit: 2023-02-24 | Discharge: 2023-02-24 | Disposition: A | Discharge status: Home / Self Care | Payer: Medicare PPO | Source: Ambulatory Visit | Attending: Family Medicine | Admitting: Family Medicine

## 2023-02-24 ENCOUNTER — Other Ambulatory Visit: Payer: Self-pay | Admitting: Family Medicine

## 2023-02-24 DIAGNOSIS — H25813 Combined forms of age-related cataract, bilateral: Secondary | ICD-10-CM | POA: Diagnosis not present

## 2023-02-24 DIAGNOSIS — M1712 Unilateral primary osteoarthritis, left knee: Secondary | ICD-10-CM | POA: Diagnosis not present

## 2023-02-24 DIAGNOSIS — M25562 Pain in left knee: Secondary | ICD-10-CM | POA: Diagnosis not present

## 2023-02-24 DIAGNOSIS — E1136 Type 2 diabetes mellitus with diabetic cataract: Secondary | ICD-10-CM | POA: Diagnosis not present

## 2023-02-24 DIAGNOSIS — M546 Pain in thoracic spine: Secondary | ICD-10-CM | POA: Diagnosis not present

## 2023-02-24 DIAGNOSIS — Z6841 Body Mass Index (BMI) 40.0 and over, adult: Secondary | ICD-10-CM | POA: Diagnosis not present

## 2023-02-24 DIAGNOSIS — G8929 Other chronic pain: Secondary | ICD-10-CM | POA: Diagnosis not present

## 2023-02-24 DIAGNOSIS — J189 Pneumonia, unspecified organism: Secondary | ICD-10-CM | POA: Diagnosis not present

## 2023-02-26 DIAGNOSIS — M5136 Other intervertebral disc degeneration, lumbar region: Secondary | ICD-10-CM | POA: Diagnosis not present

## 2023-02-26 DIAGNOSIS — M5416 Radiculopathy, lumbar region: Secondary | ICD-10-CM | POA: Diagnosis not present

## 2023-02-27 ENCOUNTER — Inpatient Hospital Stay (HOSPITAL_COMMUNITY)
Admission: EM | Admit: 2023-02-27 | Discharge: 2023-03-07 | DRG: 554 | Disposition: A | Discharge status: Skilled Nursing Facility | Payer: Medicare PPO | Attending: Internal Medicine | Admitting: Internal Medicine

## 2023-02-27 ENCOUNTER — Encounter (HOSPITAL_COMMUNITY): Payer: Self-pay

## 2023-02-27 ENCOUNTER — Other Ambulatory Visit: Payer: Self-pay

## 2023-02-27 DIAGNOSIS — M25562 Pain in left knee: Principal | ICD-10-CM | POA: Diagnosis present

## 2023-02-27 DIAGNOSIS — Z87891 Personal history of nicotine dependence: Secondary | ICD-10-CM | POA: Diagnosis not present

## 2023-02-27 DIAGNOSIS — Z7401 Bed confinement status: Secondary | ICD-10-CM | POA: Diagnosis not present

## 2023-02-27 DIAGNOSIS — Z90710 Acquired absence of both cervix and uterus: Secondary | ICD-10-CM

## 2023-02-27 DIAGNOSIS — J90 Pleural effusion, not elsewhere classified: Secondary | ICD-10-CM | POA: Diagnosis not present

## 2023-02-27 DIAGNOSIS — E785 Hyperlipidemia, unspecified: Secondary | ICD-10-CM | POA: Diagnosis present

## 2023-02-27 DIAGNOSIS — Z888 Allergy status to other drugs, medicaments and biological substances status: Secondary | ICD-10-CM | POA: Diagnosis not present

## 2023-02-27 DIAGNOSIS — R700 Elevated erythrocyte sedimentation rate: Secondary | ICD-10-CM | POA: Diagnosis present

## 2023-02-27 DIAGNOSIS — M25462 Effusion, left knee: Secondary | ICD-10-CM | POA: Diagnosis present

## 2023-02-27 DIAGNOSIS — J9811 Atelectasis: Secondary | ICD-10-CM | POA: Diagnosis not present

## 2023-02-27 DIAGNOSIS — R0682 Tachypnea, not elsewhere classified: Secondary | ICD-10-CM | POA: Diagnosis present

## 2023-02-27 DIAGNOSIS — M170 Bilateral primary osteoarthritis of knee: Secondary | ICD-10-CM | POA: Diagnosis not present

## 2023-02-27 DIAGNOSIS — M6281 Muscle weakness (generalized): Secondary | ICD-10-CM | POA: Diagnosis not present

## 2023-02-27 DIAGNOSIS — R2689 Other abnormalities of gait and mobility: Secondary | ICD-10-CM | POA: Diagnosis not present

## 2023-02-27 DIAGNOSIS — Z7985 Long-term (current) use of injectable non-insulin antidiabetic drugs: Secondary | ICD-10-CM

## 2023-02-27 DIAGNOSIS — Z7983 Long term (current) use of bisphosphonates: Secondary | ICD-10-CM

## 2023-02-27 DIAGNOSIS — Z885 Allergy status to narcotic agent status: Secondary | ICD-10-CM | POA: Diagnosis not present

## 2023-02-27 DIAGNOSIS — R7982 Elevated C-reactive protein (CRP): Secondary | ICD-10-CM | POA: Diagnosis present

## 2023-02-27 DIAGNOSIS — Z79899 Other long term (current) drug therapy: Secondary | ICD-10-CM | POA: Diagnosis not present

## 2023-02-27 DIAGNOSIS — E119 Type 2 diabetes mellitus without complications: Secondary | ICD-10-CM | POA: Diagnosis present

## 2023-02-27 DIAGNOSIS — M109 Gout, unspecified: Secondary | ICD-10-CM | POA: Diagnosis present

## 2023-02-27 DIAGNOSIS — M549 Dorsalgia, unspecified: Secondary | ICD-10-CM | POA: Diagnosis not present

## 2023-02-27 DIAGNOSIS — R7989 Other specified abnormal findings of blood chemistry: Secondary | ICD-10-CM | POA: Diagnosis present

## 2023-02-27 DIAGNOSIS — M79606 Pain in leg, unspecified: Secondary | ICD-10-CM | POA: Diagnosis not present

## 2023-02-27 DIAGNOSIS — Z7982 Long term (current) use of aspirin: Secondary | ICD-10-CM | POA: Diagnosis not present

## 2023-02-27 DIAGNOSIS — Z4789 Encounter for other orthopedic aftercare: Secondary | ICD-10-CM | POA: Diagnosis not present

## 2023-02-27 DIAGNOSIS — Z8249 Family history of ischemic heart disease and other diseases of the circulatory system: Secondary | ICD-10-CM | POA: Diagnosis not present

## 2023-02-27 DIAGNOSIS — I10 Essential (primary) hypertension: Secondary | ICD-10-CM | POA: Diagnosis not present

## 2023-02-27 DIAGNOSIS — Z96651 Presence of right artificial knee joint: Secondary | ICD-10-CM | POA: Diagnosis present

## 2023-02-27 DIAGNOSIS — I471 Supraventricular tachycardia, unspecified: Secondary | ICD-10-CM | POA: Diagnosis not present

## 2023-02-27 DIAGNOSIS — Z7984 Long term (current) use of oral hypoglycemic drugs: Secondary | ICD-10-CM | POA: Diagnosis not present

## 2023-02-27 DIAGNOSIS — K219 Gastro-esophageal reflux disease without esophagitis: Secondary | ICD-10-CM | POA: Diagnosis not present

## 2023-02-27 DIAGNOSIS — I2699 Other pulmonary embolism without acute cor pulmonale: Secondary | ICD-10-CM | POA: Diagnosis not present

## 2023-02-27 DIAGNOSIS — R1310 Dysphagia, unspecified: Secondary | ICD-10-CM | POA: Diagnosis not present

## 2023-02-27 NOTE — ED Triage Notes (Addendum)
Pt brought from home by [facility] for left knee pain x2 weeks. Reports pt was seen at doctor today & had fluid drawn off but the pain hasn't improved.  Pt states she is unable to put any weight on her leg & is unable to walk. Pt states the doctor told her he didn't like the way the fluid looked & was sending it off for testing.  BP150/92 HR96 [id number]

## 2023-02-27 NOTE — ED Provider Triage Note (Signed)
Emergency Medicine Provider Triage Evaluation Note  [name] , a 72 y.o. female  was evaluated in triage.  Pt complains of left knee pain.  Patient reports increasing pain and swelling of the left knee.  She saw orthopedics today and states that she had fluid drawn off of the knee.  She continues to have pain prompting emergency department visit.  Transported by EMS.  Denies fevers, vomiting  Review of Systems  Positive: Knee pain Negative: Fever, vomiting  Physical Exam  BP (!) 172/107 (BP Location: Right Arm)   Pulse (!) 118   Temp 98.2 F (36.8 C) (Oral)   Resp (!) 22   Ht 5\' 3"  (1.6 m)   Wt 108.9 kg   SpO2 98%   BMI 42.51 kg/m  Gen:   Awake, no distress   Resp:  Normal effort  MSK:   Moves extremities without difficulty  Other:  Right knee tender, possible mild effusion  Medical Decision Making  Medically screening exam initiated at 7:13 PM.  Appropriate orders placed.  Fransisca Kaufmann was informed that the remainder of the evaluation will be completed by another provider, this initial triage assessment does not replace that evaluation, and the importance of remaining in the ED until their evaluation is complete.  Patient appears stable, feel likely okay for waiting room.   Renne Crigler, PA-C 02/27/23 1914

## 2023-02-28 DIAGNOSIS — Z7983 Long term (current) use of bisphosphonates: Secondary | ICD-10-CM | POA: Diagnosis not present

## 2023-02-28 DIAGNOSIS — J9811 Atelectasis: Secondary | ICD-10-CM | POA: Diagnosis not present

## 2023-02-28 DIAGNOSIS — R7989 Other specified abnormal findings of blood chemistry: Secondary | ICD-10-CM | POA: Diagnosis present

## 2023-02-28 DIAGNOSIS — Z90710 Acquired absence of both cervix and uterus: Secondary | ICD-10-CM | POA: Diagnosis not present

## 2023-02-28 DIAGNOSIS — Z7984 Long term (current) use of oral hypoglycemic drugs: Secondary | ICD-10-CM | POA: Diagnosis not present

## 2023-02-28 DIAGNOSIS — Z8249 Family history of ischemic heart disease and other diseases of the circulatory system: Secondary | ICD-10-CM | POA: Diagnosis not present

## 2023-02-28 DIAGNOSIS — I471 Supraventricular tachycardia, unspecified: Secondary | ICD-10-CM | POA: Diagnosis not present

## 2023-02-28 DIAGNOSIS — M109 Gout, unspecified: Secondary | ICD-10-CM | POA: Diagnosis present

## 2023-02-28 DIAGNOSIS — R700 Elevated erythrocyte sedimentation rate: Secondary | ICD-10-CM | POA: Diagnosis present

## 2023-02-28 DIAGNOSIS — M25562 Pain in left knee: Secondary | ICD-10-CM | POA: Diagnosis present

## 2023-02-28 DIAGNOSIS — E785 Hyperlipidemia, unspecified: Secondary | ICD-10-CM | POA: Diagnosis present

## 2023-02-28 DIAGNOSIS — Z7985 Long-term (current) use of injectable non-insulin antidiabetic drugs: Secondary | ICD-10-CM | POA: Diagnosis not present

## 2023-02-28 DIAGNOSIS — R7982 Elevated C-reactive protein (CRP): Secondary | ICD-10-CM | POA: Diagnosis present

## 2023-02-28 DIAGNOSIS — R0682 Tachypnea, not elsewhere classified: Secondary | ICD-10-CM | POA: Diagnosis present

## 2023-02-28 DIAGNOSIS — E119 Type 2 diabetes mellitus without complications: Secondary | ICD-10-CM | POA: Diagnosis present

## 2023-02-28 DIAGNOSIS — Z96651 Presence of right artificial knee joint: Secondary | ICD-10-CM | POA: Diagnosis present

## 2023-02-28 DIAGNOSIS — M25462 Effusion, left knee: Secondary | ICD-10-CM | POA: Diagnosis present

## 2023-02-28 DIAGNOSIS — J90 Pleural effusion, not elsewhere classified: Secondary | ICD-10-CM | POA: Diagnosis not present

## 2023-02-28 DIAGNOSIS — Z79899 Other long term (current) drug therapy: Secondary | ICD-10-CM | POA: Diagnosis not present

## 2023-02-28 DIAGNOSIS — Z87891 Personal history of nicotine dependence: Secondary | ICD-10-CM | POA: Diagnosis not present

## 2023-02-28 DIAGNOSIS — Z7982 Long term (current) use of aspirin: Secondary | ICD-10-CM | POA: Diagnosis not present

## 2023-02-28 DIAGNOSIS — Z888 Allergy status to other drugs, medicaments and biological substances status: Secondary | ICD-10-CM | POA: Diagnosis not present

## 2023-02-28 DIAGNOSIS — I2699 Other pulmonary embolism without acute cor pulmonale: Secondary | ICD-10-CM | POA: Diagnosis not present

## 2023-02-28 DIAGNOSIS — Z885 Allergy status to narcotic agent status: Secondary | ICD-10-CM | POA: Diagnosis not present

## 2023-02-28 LAB — CBC
HCT: 35 % — ABNORMAL LOW (ref 36.0–46.0)
Hemoglobin: 10.4 g/dL — ABNORMAL LOW (ref 12.0–15.0)
MCH: 25.7 pg — ABNORMAL LOW (ref 26.0–34.0)
MCHC: 29.7 g/dL — ABNORMAL LOW (ref 30.0–36.0)
MCV: 86.4 fL (ref 80.0–100.0)
Platelets: 484 10*3/uL — ABNORMAL HIGH (ref 150–400)
RBC: 4.05 MIL/uL (ref 3.87–5.11)
RDW: 13.6 % (ref 11.5–15.5)
WBC: 10.2 10*3/uL (ref 4.0–10.5)
nRBC: 0 % (ref 0.0–0.2)

## 2023-02-28 LAB — SYNOVIAL CELL COUNT + DIFF, W/ CRYSTALS
Crystals, Fluid: NONE SEEN
Eosinophils-Synovial: 0 % (ref 0–1)
Lymphocytes-Synovial Fld: 1 % (ref 0–20)
Monocyte-Macrophage-Synovial Fluid: 0 % — ABNORMAL LOW (ref 50–90)
Neutrophil, Synovial: 99 % — ABNORMAL HIGH (ref 0–25)
WBC, Synovial: UNDETERMINED /mm3 (ref 0–200)

## 2023-02-28 LAB — URIC ACID: Uric Acid, Serum: 5.8 mg/dL (ref 2.5–7.1)

## 2023-02-28 LAB — CBC WITH DIFFERENTIAL/PLATELET
Abs Immature Granulocytes: 0.06 10*3/uL (ref 0.00–0.07)
Basophils Absolute: 0.1 10*3/uL (ref 0.0–0.1)
Basophils Relative: 1 %
Eosinophils Absolute: 0.1 10*3/uL (ref 0.0–0.5)
Eosinophils Relative: 1 %
HCT: 37.6 % (ref 36.0–46.0)
Hemoglobin: 11.1 g/dL — ABNORMAL LOW (ref 12.0–15.0)
Immature Granulocytes: 1 %
Lymphocytes Relative: 14 %
Lymphs Abs: 1.4 10*3/uL (ref 0.7–4.0)
MCH: 25.3 pg — ABNORMAL LOW (ref 26.0–34.0)
MCHC: 29.5 g/dL — ABNORMAL LOW (ref 30.0–36.0)
MCV: 85.8 fL (ref 80.0–100.0)
Monocytes Absolute: 1.3 10*3/uL — ABNORMAL HIGH (ref 0.1–1.0)
Monocytes Relative: 12 %
Neutro Abs: 7.6 10*3/uL (ref 1.7–7.7)
Neutrophils Relative %: 71 %
Platelets: 499 10*3/uL — ABNORMAL HIGH (ref 150–400)
RBC: 4.38 MIL/uL (ref 3.87–5.11)
RDW: 13.7 % (ref 11.5–15.5)
WBC: 10.6 10*3/uL — ABNORMAL HIGH (ref 4.0–10.5)
nRBC: 0 % (ref 0.0–0.2)

## 2023-02-28 LAB — BASIC METABOLIC PANEL
Anion gap: 11 (ref 5–15)
BUN: 14 mg/dL (ref 8–23)
CO2: 23 mmol/L (ref 22–32)
Calcium: 9.2 mg/dL (ref 8.9–10.3)
Chloride: 104 mmol/L (ref 98–111)
Creatinine, Ser: 0.89 mg/dL (ref 0.44–1.00)
GFR, Estimated: >60 mL/min (ref >60)
Glucose, Bld: 134 mg/dL — ABNORMAL HIGH (ref 70–99)
Potassium: 3.7 mmol/L (ref 3.5–5.1)
Sodium: 138 mmol/L (ref 135–145)

## 2023-02-28 LAB — GLUCOSE, CAPILLARY
Glucose-Capillary: 123 mg/dL — ABNORMAL HIGH (ref 70–99)
Glucose-Capillary: 116 mg/dL — ABNORMAL HIGH (ref 70–99)
Glucose-Capillary: 128 mg/dL — ABNORMAL HIGH (ref 70–99)

## 2023-02-28 LAB — SEDIMENTATION RATE: Sed Rate: 95 mm/h — ABNORMAL HIGH (ref 0–22)

## 2023-02-28 LAB — CREATININE, SERUM
Creatinine, Ser: 0.83 mg/dL (ref 0.44–1.00)
GFR, Estimated: >60 mL/min (ref >60)

## 2023-02-28 LAB — PROCALCITONIN: Procalcitonin: <0.1 ng/mL

## 2023-02-28 LAB — C-REACTIVE PROTEIN: CRP: 21.1 mg/dL — ABNORMAL HIGH (ref <1.0)

## 2023-02-28 MED ORDER — ACETAMINOPHEN 650 MG RE SUPP: 650.0000 mg | Freq: Four times a day (QID) | RECTAL | Status: DC | PRN

## 2023-02-28 MED ORDER — HEPARIN SODIUM (PORCINE) 5000 UNIT/ML IJ SOLN
5000.0000 [IU] | Freq: Three times a day (TID) | INTRAMUSCULAR | Status: DC
  Administered 2023-02-28 – 2023-03-07 (×22): 5000 [IU] via SUBCUTANEOUS
  Filled 2023-02-28 (×22): qty 1

## 2023-02-28 MED ORDER — SODIUM CHLORIDE 0.9 % IV SOLN: 1.0000 g | INTRAVENOUS | Status: DC

## 2023-02-28 MED ORDER — VANCOMYCIN HCL 2000 MG/400ML IV SOLN
2000.0000 mg | Freq: Once | INTRAVENOUS | Status: AC
  Administered 2023-02-28: 2000 mg via INTRAVENOUS
  Filled 2023-02-28: qty 400

## 2023-02-28 MED ORDER — INSULIN ASPART 100 UNIT/ML IJ SOLN
0.0000 [IU] | Freq: Three times a day (TID) | INTRAMUSCULAR | Status: DC
  Administered 2023-03-01 – 2023-03-02 (×2): 2 [IU] via SUBCUTANEOUS
  Administered 2023-03-02: 5 [IU] via SUBCUTANEOUS
  Administered 2023-03-02: 3 [IU] via SUBCUTANEOUS
  Administered 2023-03-03 – 2023-03-04 (×4): 2 [IU] via SUBCUTANEOUS
  Administered 2023-03-05: 3 [IU] via SUBCUTANEOUS
  Administered 2023-03-05: 2 [IU] via SUBCUTANEOUS
  Administered 2023-03-06: 5 [IU] via SUBCUTANEOUS
  Administered 2023-03-06 – 2023-03-07 (×2): 3 [IU] via SUBCUTANEOUS
  Administered 2023-03-07: 2 [IU] via SUBCUTANEOUS

## 2023-02-28 MED ORDER — HYDROMORPHONE HCL 1 MG/ML IJ SOLN
0.5000 mg | INTRAMUSCULAR | Status: DC | PRN
  Administered 2023-02-28 – 2023-03-05 (×20): 0.5 mg via INTRAVENOUS
  Filled 2023-02-28 (×20): qty 0.5

## 2023-02-28 MED ORDER — HYDROMORPHONE HCL 1 MG/ML IJ SOLN
0.5000 mg | Freq: Once | INTRAMUSCULAR | Status: AC
  Administered 2023-02-28: 0.5 mg via INTRAVENOUS
  Filled 2023-02-28: qty 1

## 2023-02-28 MED ORDER — OXYCODONE HCL 5 MG PO TABS
5.0000 mg | ORAL_TABLET | ORAL | Status: DC | PRN
  Administered 2023-02-28: 5 mg via ORAL
  Filled 2023-02-28: qty 1

## 2023-02-28 MED ORDER — ORAL CARE MOUTH RINSE: 15.0000 mL | OROMUCOSAL | Status: DC | PRN

## 2023-02-28 MED ORDER — ACETAMINOPHEN 325 MG PO TABS
650.0000 mg | ORAL_TABLET | Freq: Four times a day (QID) | ORAL | Status: DC | PRN
  Administered 2023-03-02 – 2023-03-06 (×5): 650 mg via ORAL
  Filled 2023-02-28 (×5): qty 2

## 2023-02-28 MED ORDER — ONDANSETRON HCL 4 MG/2ML IJ SOLN: 4.0000 mg | Freq: Four times a day (QID) | INTRAMUSCULAR | Status: DC | PRN

## 2023-02-28 MED ORDER — VANCOMYCIN HCL 1250 MG/250ML IV SOLN
1250.0000 mg | INTRAVENOUS | Status: DC
  Administered 2023-03-01 – 2023-03-04 (×4): 1250 mg via INTRAVENOUS
  Filled 2023-02-28 (×4): qty 250

## 2023-02-28 MED ORDER — ATORVASTATIN CALCIUM 10 MG PO TABS
20.0000 mg | ORAL_TABLET | Freq: Every day | ORAL | Status: DC
  Administered 2023-02-28 – 2023-03-06 (×7): 20 mg via ORAL
  Filled 2023-02-28 (×7): qty 2

## 2023-02-28 MED ORDER — ONDANSETRON HCL 4 MG PO TABS: 4.0000 mg | ORAL_TABLET | Freq: Four times a day (QID) | ORAL | Status: DC | PRN

## 2023-02-28 MED ORDER — ASPIRIN 81 MG PO TBEC
81.0000 mg | DELAYED_RELEASE_TABLET | Freq: Every day | ORAL | Status: DC
  Administered 2023-02-28 – 2023-03-07 (×8): 81 mg via ORAL
  Filled 2023-02-28 (×8): qty 1

## 2023-02-28 MED ORDER — ALBUTEROL SULFATE (2.5 MG/3ML) 0.083% IN NEBU: 2.5000 mg | INHALATION_SOLUTION | RESPIRATORY_TRACT | Status: DC | PRN

## 2023-02-28 MED ORDER — OXYCODONE HCL 5 MG PO TABS
10.0000 mg | ORAL_TABLET | Freq: Once | ORAL | Status: AC
  Administered 2023-02-28: 10 mg via ORAL
  Filled 2023-02-28: qty 2

## 2023-02-28 MED ORDER — PANTOPRAZOLE SODIUM 40 MG PO TBEC
40.0000 mg | DELAYED_RELEASE_TABLET | Freq: Every day | ORAL | Status: DC
  Administered 2023-02-28 – 2023-03-07 (×8): 40 mg via ORAL
  Filled 2023-02-28 (×8): qty 1

## 2023-02-28 MED ORDER — LIDOCAINE HCL (PF) 1 % IJ SOLN
30.0000 mL | Freq: Once | INTRAMUSCULAR | Status: AC
  Administered 2023-02-28: 30 mL
  Filled 2023-02-28: qty 30

## 2023-02-28 MED ORDER — SODIUM CHLORIDE 0.9 % IV SOLN
2.0000 g | INTRAVENOUS | Status: DC
  Administered 2023-02-28 – 2023-03-04 (×5): 2 g via INTRAVENOUS
  Filled 2023-02-28 (×5): qty 20

## 2023-02-28 NOTE — Plan of Care (Signed)
  Problem: Education: Goal: Knowledge of General Education information will improve Description Including pain rating scale, medication(s)/side effects and non-pharmacologic comfort measures Outcome: Progressing   

## 2023-02-28 NOTE — Progress Notes (Signed)
Patient was seen on 02/27/23 and aspirated in the office by Dr. Nilsa Nutting team. Fluid aspirate was sent to [facility] labs and awaiting results. At this time no plan for surgical intervention unless confirmation on aspirate from the office shows signs concerning for infection. Will have to follow up on those to determine if intervention is necessary otherwise pain control per medicine team.

## 2023-02-28 NOTE — ED Provider Notes (Signed)
[facility] Emergency Department Provider Note MRN:  [id number]  Arrival date & time: 02/28/23     Chief Complaint   Knee Pain   History of Present Illness   [name] is a 72 y.o. year-old female with a history of diabetes presenting to the ED with chief complaint of knee pain.  Pain to the left knee ever since being discharged from the hospital.  Was admitted for pneumonia.  Has been on antibiotics.  Knee pain has continued to worsen.  Saw an orthopedic specialist yesterday, had an arthrocentesis, x-ray.  Now cannot walk.  Pain is too severe.  Denies fever, no other complaints.  Review of Systems  A thorough review of systems was obtained and all systems are negative except as noted in the HPI and PMH.   Patient's Health History    Past Medical History:  Diagnosis Date   Back pain    Diabetes mellitus without complication (HCC)    Type II   GERD (gastroesophageal reflux disease)    ocassional --takes tums   History of blood transfusion    with knee surgeries   Hyperlipemia    Joint pain     Past Surgical History:  Procedure Laterality Date   ABDOMINAL HYSTERECTOMY     BREAST BIOPSY Left 09/10/2022   MM LT BREAST BX W LOC DEV 1ST LESION IMAGE BX SPEC STEREO GUIDE 09/10/2022 [facility]   CHOLECYSTECTOMY     COLONOSCOPY     COLONOSCOPY WITH PROPOFOL N/A 08/26/2017   Procedure: COLONOSCOPY WITH PROPOFOL;  Surgeon: Graylin Shiver, MD;  Location: [facility] ENDOSCOPY;  Service: Endoscopy;  Laterality: N/A;   LEG SURGERY Right    fracture   REPLACEMENT TOTAL KNEE Right    "5"- patient reports    Family History  Problem Relation Age of Onset   Heart disease Mother    Cancer Father    Cancer Brother    Cirrhosis Brother     Social History   Socioeconomic History   Marital status: Divorced    Spouse name: Not on file   Number of children: Not on file   Years of education: Not on file   Highest education level: Not on file  Occupational  History   Not on file  Tobacco Use   Smoking status: Former    Current packs/day: 0.00    Types: Cigarettes    Start date: 96    Quit date: 1999    Years since quitting: 25.7   Smokeless tobacco: Never  Vaping Use   Vaping status: Never Used  Substance and Sexual Activity   Alcohol use: Never   Drug use: Never   Sexual activity: Not on file  Other Topics Concern   Not on file  Social History Narrative   Not on file   Social Determinants of Health   Financial Resource Strain: Not on file  Food Insecurity: No Food Insecurity (02/18/2023)   Hunger Vital Sign    Worried About Running Out of Food in the Last Year: Never true    Ran Out of Food in the Last Year: Never true  Transportation Needs: No Transportation Needs (02/18/2023)   PRAPARE - Administrator, Civil Service (Medical): No    Lack of Transportation (Non-Medical): No  Physical Activity: Not on file  Stress: Not on file  Social Connections: Not on file  Intimate Partner Violence: Not At Risk (02/18/2023)   Humiliation, Afraid, Rape, and Kick questionnaire  Fear of Current or Ex-Partner: No    Emotionally Abused: No    Physically Abused: No    Sexually Abused: No     Physical Exam   Vitals:   02/28/23 0510 02/28/23 0539  BP: (!) 142/87   Pulse: (!) 109   Resp: 18   Temp:  98.3 F (36.8 C)  SpO2: 96%     CONSTITUTIONAL: Well-appearing, NAD NEURO/PSYCH:  Alert and oriented x 3, no focal deficits EYES:  eyes equal and reactive ENT/NECK:  no LAD, no JVD CARDIO: Regular rate, well-perfused, normal S1 and S2 PULM:  CTAB no wheezing or rhonchi GI/GU:  non-distended, non-tender MSK/SPINE:  No gross deformities, no edema SKIN:  no rash, atraumatic   *Additional and/or pertinent findings included in MDM below  Diagnostic and Interventional Summary    EKG Interpretation Date/Time:    Ventricular Rate:    PR Interval:    QRS Duration:    QT Interval:    QTC Calculation:   R Axis:       Text Interpretation:         Labs Reviewed  BASIC METABOLIC PANEL - Abnormal; Notable for the following components:      Result Value   Glucose, Bld 134 (*)    All other components within normal limits  CBC WITH DIFFERENTIAL/PLATELET - Abnormal; Notable for the following components:   WBC 10.6 (*)    Hemoglobin 11.1 (*)    MCH 25.3 (*)    MCHC 29.5 (*)    Platelets 499 (*)    Monocytes Absolute 1.3 (*)    All other components within normal limits  C-REACTIVE PROTEIN - Abnormal; Notable for the following components:   CRP 21.1 (*)    All other components within normal limits  SEDIMENTATION RATE - Abnormal; Notable for the following components:   Sed Rate 95 (*)    All other components within normal limits  SYNOVIAL CELL COUNT + DIFF, W/ CRYSTALS - Abnormal; Notable for the following components:   Color, Synovial RED (*)    Appearance-Synovial TURBID (*)    Neutrophil, Synovial 99 (*)    Monocyte-Macrophage-Synovial Fluid 0 (*)    All other components within normal limits  BODY FLUID CULTURE W GRAM STAIN  GLUCOSE, BODY FLUID OTHER            PROTEIN, BODY FLUID (OTHER)    No orders to display    Medications  HYDROmorphone (DILAUDID) injection 0.5 mg (has no administration in time range)  oxyCODONE (Oxy IR/ROXICODONE) immediate release tablet 10 mg (10 mg Oral Given 02/28/23 0053)  lidocaine (PF) (XYLOCAINE) 1 % injection 30 mL (30 mLs Infiltration Given by Other 02/28/23 0340)  HYDROmorphone (DILAUDID) injection 0.5 mg (0.5 mg Intravenous Given 02/28/23 0313)     Procedures  /  Critical Care .Joint Aspiration/Arthrocentesis  Date/Time: 02/28/2023 4:36 AM  Performed by: Sabas Sous, MD Authorized by: Sabas Sous, MD   Consent:    Consent obtained:  Verbal   Consent given by:  Patient   Risks, benefits, and alternatives were discussed: yes     Risks discussed:  Bleeding, incomplete drainage, nerve damage, infection, pain and poor cosmetic result Universal  protocol:    Procedure explained and questions answered to patient or proxy's satisfaction: yes     Immediately prior to procedure, a time out was called: yes     Patient identity confirmed:  Verbally with patient Location:    Location:  Knee   Knee:  L knee Anesthesia:    Anesthesia method:  Local infiltration   Local anesthetic:  Lidocaine 1% w/o epi Procedure details:    Preparation: Patient was prepped and draped in usual sterile fashion     Needle gauge:  18 G   Ultrasound guidance: yes     Approach:  Medial   Aspirate amount:  8ml   Aspirate characteristics:  Bloody and cloudy   Steroid injected: no   Post-procedure details:    Dressing:  Adhesive bandage   Procedure completion:  Tolerated well, no immediate complications   ED Course and Medical Decision Making  Initial Impression and Ddx Patient has severely limited range of motion of the left knee due to pain.  Mild tachycardia and hyperthermia on arrival.  Had a recent admission for pneumonia.  History of diabetes.  The constellation of history and comorbidities is suspicious for septic joint via hematogenous seeding.  Obtaining inflammatory markers.  I spoke with PA Sharon Seller of [facility] who was able to review the recent office visit.  Unfortunately the arthrocentesis sample was only drawn 6 to 8 hours ago and usually takes 24 hours to result as it is a send out lab.  The x-ray showed mild arthritis but no other concerns.  May need to repeat the arthrocentesis.  Past medical/surgical history that increases complexity of ED encounter: Diabetes  Interpretation of Diagnostics I personally reviewed the laboratory assessment and my interpretation is as follows: Elevated inflammatory markers    Patient Reassessment and Ultimate Disposition/Management     Unfortunately clot formed in the synovial fluid analysis, still the fluid was sent for culture.  Discussed case with Dr. Peggye Ley Mcclung of [facility], no need  for emergent washout at this time.  Will await synovial fluid analysis results obtained yesterday in the outpatient setting, plan is for hospitalist admission for pain control plus or minus antibiotics.  Patient management required discussion with the following services or consulting groups:  Hospitalist Service and Orthopedic Surgery  Complexity of Problems Addressed Acute illness or injury that poses threat of life of bodily function  Additional Data Reviewed and Analyzed Further history obtained from: Further history from spouse/family member  Additional Factors Impacting ED Encounter Risk Consideration of hospitalization  Elmer Sow. Pilar Plate, MD [facility] Emergency Medicine [facility] [email or web address]  Final Clinical Impressions(s) / ED Diagnoses     ICD-10-CM   1. Acute pain of left knee  M25.562       ED Discharge Orders     None        Discharge Instructions Discussed with and Provided to Patient:   Discharge Instructions   None      Sabas Sous, MD 02/28/23 [id number]

## 2023-02-28 NOTE — ED Notes (Signed)
ED TO INPATIENT HANDOFF REPORT  ED Nurse Name and Phone #: Makailah Slavick [phone]    S Name/Age/Gender [name] 72 y.o. female Room/Bed: 031C/031C  Code Status   Code Status: Full Code  Home/SNF/Other Home Patient oriented to: self, place, time, and situation Is this baseline? Yes   Triage Complete: Triage complete  Chief Complaint Left anterior knee pain [M25.562]  Triage Note Pt brought from home by [facility] for left knee pain x2 weeks. Reports pt was seen at doctor today & had fluid drawn off but the pain hasn't improved.  Pt states she is unable to put any weight on her leg & is unable to walk. Pt states the doctor told her he didn't like the way the fluid looked & was sending it off for testing.  BP150/92 HR96 [id number]   Allergies Allergies  Allergen Reactions   Codeine Itching   Morphine Itching   Sulindac Itching    Burnt spots    Level of Care/Admitting Diagnosis ED Disposition     ED Disposition  Admit   Condition  --   Comment  Hospital Area: [facility] [[id number]]  Level of Care: Telemetry Medical [104]  May admit patient to [facility] or [facility] if equivalent level of care is available:: No  Covid Evaluation: Asymptomatic - no recent exposure (last 10 days) testing not required  Diagnosis: Left anterior knee pain [536644]  Admitting Physician: Lurline Del [[id number]]  Attending Physician: Lurline Del [[id number]]  Certification:: I certify this patient will need inpatient services for at least 2 midnights  Expected Medical Readiness: 03/03/2023          B Medical/Surgery History Past Medical History:  Diagnosis Date   Back pain    Diabetes mellitus without complication (HCC)    Type II   GERD (gastroesophageal reflux disease)    ocassional --takes tums   History of blood transfusion    with knee surgeries   Hyperlipemia    Joint pain    Past Surgical History:  Procedure Laterality Date   ABDOMINAL HYSTERECTOMY      BREAST BIOPSY Left 09/10/2022   MM LT BREAST BX W LOC DEV 1ST LESION IMAGE BX SPEC STEREO GUIDE 09/10/2022 [facility]   CHOLECYSTECTOMY     COLONOSCOPY     COLONOSCOPY WITH PROPOFOL N/A 08/26/2017   Procedure: COLONOSCOPY WITH PROPOFOL;  Surgeon: Graylin Shiver, MD;  Location: [facility] ENDOSCOPY;  Service: Endoscopy;  Laterality: N/A;   LEG SURGERY Right    fracture   REPLACEMENT TOTAL KNEE Right    "5"- patient reports     A IV Location/Drains/Wounds Patient Lines/Drains/Airways Status     Active Line/Drains/Airways     Name Placement date Placement time Site Days   Peripheral IV 02/28/23 20 G Left Antecubital 02/28/23  0036  Antecubital  less than 1            Intake/Output Last 24 hours No intake or output data in the 24 hours ending 02/28/23 0900  Labs/Imaging Results for orders placed or performed during the hospital encounter of 02/27/23 (from the past 48 hour(s))  Basic metabolic panel     Status: Abnormal   Collection Time: 02/28/23 12:34 AM  Result Value Ref Range   Sodium 138 135 - 145 mmol/L   Potassium 3.7 3.5 - 5.1 mmol/L   Chloride 104 98 - 111 mmol/L   CO2 23 22 - 32 mmol/L   Glucose, Bld 134 (H) 70 - 99  mg/dL    Comment: Glucose reference range applies only to samples taken after fasting for at least 8 hours.   BUN 14 8 - 23 mg/dL   Creatinine, Ser 8.11 0.44 - 1.00 mg/dL   Calcium 9.2 8.9 - 91.4 mg/dL   GFR, Estimated >78 >29 mL/min    Comment: (NOTE) Calculated using the CKD-EPI Creatinine Equation (2021)    Anion gap 11 5 - 15    Comment: Performed at [facility], [address], [place], [place] [address]  CBC with Differential     Status: Abnormal   Collection Time: 02/28/23 12:34 AM  Result Value Ref Range   WBC 10.6 (H) 4.0 - 10.5 K/uL   RBC 4.38 3.87 - 5.11 MIL/uL   Hemoglobin 11.1 (L) 12.0 - 15.0 g/dL   HCT 08.6 57.8 - 46.9 %   MCV 85.8 80.0 - 100.0 fL   MCH 25.3 (L) 26.0 - 34.0 pg   MCHC 29.5 (L) 30.0 - 36.0 g/dL   RDW 62.9  52.8 - 41.3 %   Platelets 499 (H) 150 - 400 K/uL   nRBC 0.0 0.0 - 0.2 %   Neutrophils Relative % 71 %   Neutro Abs 7.6 1.7 - 7.7 K/uL   Lymphocytes Relative 14 %   Lymphs Abs 1.4 0.7 - 4.0 K/uL   Monocytes Relative 12 %   Monocytes Absolute 1.3 (H) 0.1 - 1.0 K/uL   Eosinophils Relative 1 %   Eosinophils Absolute 0.1 0.0 - 0.5 K/uL   Basophils Relative 1 %   Basophils Absolute 0.1 0.0 - 0.1 K/uL   Immature Granulocytes 1 %   Abs Immature Granulocytes 0.06 0.00 - 0.07 K/uL    Comment: Performed at [facility], [address] [place], [place] [address]  C-reactive protein     Status: Abnormal   Collection Time: 02/28/23 12:34 AM  Result Value Ref Range   CRP 21.1 (H) <1.0 mg/dL    Comment: Performed at [facility], [address] [place], [place] [address]  Sedimentation rate     Status: Abnormal   Collection Time: 02/28/23 12:34 AM  Result Value Ref Range   Sed Rate 95 (H) 0 - 22 mm/hr    Comment: Performed at [facility], [address] [place], [place] [address]  Synovial cell count + diff, w/ crystals     Status: Abnormal   Collection Time: 02/28/23  2:27 AM  Result Value Ref Range   Color, Synovial RED (A) YELLOW   Appearance-Synovial TURBID (A) CLEAR   Crystals, Fluid NO CRYSTALS SEEN    WBC, Synovial UNABLE TO PERFORM COUNT DUE TO CLOT IN SPECIMEN 0 - 200 /cu mm   Neutrophil, Synovial 99 (H) 0 - 25 %   Lymphocytes-Synovial Fld 1 0 - 20 %   Monocyte-Macrophage-Synovial Fluid 0 (L) 50 - 90 %   Eosinophils-Synovial 0 0 - 1 %    Comment: Performed at [facility], [address] [place], [place] [address]  Body fluid culture w Gram Stain     Status: None (Preliminary result)   Collection Time: 02/28/23  3:40 AM   Specimen: Synovium; Body Fluid  Result Value Ref Range   Specimen Description SYNOVIAL FLUID    Special Requests LEFT KNEE    Gram Stain      FEW WBC PRESENT, PREDOMINANTLY PMN NO ORGANISMS SEEN Performed at [facility]  Lab, [address] [place], [place] [address]  Culture PENDING    Report Status PENDING    No results found.  Pending Labs Unresulted Labs (From admission, onward)     Start     Ordered   03/01/23 0500  Comprehensive metabolic panel  Tomorrow morning,   R        02/28/23 0853   03/01/23 0500  CBC  Tomorrow morning,   R        02/28/23 0853   03/01/23 0500  Protime-INR  Tomorrow morning,   R        02/28/23 0853   02/28/23 0900  Culture, blood (Routine X 2) w Reflex to ID Panel  BLOOD CULTURE X 2,   R (with TIMED occurrences)      02/28/23 0859   02/28/23 0900  Uric acid  Once,   R        02/28/23 0859   02/28/23 0859  Procalcitonin  Once,   R       References:    Procalcitonin Lower Respiratory Tract Infection AND Sepsis Procalcitonin Algorithm   02/28/23 0859   02/28/23 0851  CBC  (heparin)  Once,   R       Comments: Baseline for heparin therapy IF NOT ALREADY DRAWN.  Notify MD if PLT < 100 K.    02/28/23 0853   02/28/23 0851  Creatinine, serum  (heparin)  Once,   R       Comments: Baseline for heparin therapy IF NOT ALREADY DRAWN.    02/28/23 0853   02/28/23 0227  Glucose, Body Fluid Other  (Arthrocentesis Panel)  ONCE - STAT,   URGENT        02/28/23 0226   02/28/23 0227  Protein, body fluid (other)  (Arthrocentesis Panel)  ONCE - STAT,   URGENT        02/28/23 0226            Vitals/Pain Today's Vitals   02/28/23 0700 02/28/23 0715 02/28/23 0730 02/28/23 0837  BP: (!) 150/87 (!) 156/93 (!) 161/86 (!) 144/71  Pulse:    (!) 107  Resp:    18  Temp:      TempSrc:      SpO2:    97%  Weight:      Height:      PainSc:        Isolation Precautions No active isolations  Medications Medications  aspirin EC tablet 81 mg (has no administration in time range)  atorvastatin (LIPITOR) tablet 20 mg (has no administration in time range)  pantoprazole (PROTONIX) EC tablet 40 mg (has no administration in time range)  insulin aspart (novoLOG) injection 0-15 Units (has  no administration in time range)  heparin injection 5,000 Units (has no administration in time range)  acetaminophen (TYLENOL) tablet 650 mg (has no administration in time range)    Or  acetaminophen (TYLENOL) suppository 650 mg (has no administration in time range)  oxyCODONE (Oxy IR/ROXICODONE) immediate release tablet 5 mg (has no administration in time range)  HYDROmorphone (DILAUDID) injection 0.5 mg (has no administration in time range)  ondansetron (ZOFRAN) tablet 4 mg (has no administration in time range)    Or  ondansetron (ZOFRAN) injection 4 mg (has no administration in time range)  albuterol (PROVENTIL) (2.5 MG/3ML) 0.083% nebulizer solution 2.5 mg (has no administration in time range)  cefTRIAXone (ROCEPHIN) 1 g in sodium chloride 0.9 % 100 mL IVPB (has no administration in time range)  oxyCODONE (Oxy IR/ROXICODONE) immediate release tablet 10 mg (10  mg Oral Given 02/28/23 0053)  lidocaine (PF) (XYLOCAINE) 1 % injection 30 mL (30 mLs Infiltration Given by Other 02/28/23 0340)  HYDROmorphone (DILAUDID) injection 0.5 mg (0.5 mg Intravenous Given 02/28/23 0313)  HYDROmorphone (DILAUDID) injection 0.5 mg (0.5 mg Intravenous Given 02/28/23 0743)    Mobility walks     Focused Assessments Musculoskeletal   R Recommendations: See Admitting Provider Note  Report given to:   Additional Notes:

## 2023-02-28 NOTE — Progress Notes (Signed)
Pharmacy Antibiotic Note  [name] is a 72 y.o. female admitted on 02/27/2023 with  possible septic joint .  Pharmacy has been consulted for vancomycin dosing. Pt is afebrile and WBC is slightly elevated.   Plan: Vancomycin 2g IV x 1 then 1250mg  IV Q24H F/u renal fxn, C&S, clinical status and peak/trough at SS  Height: 5\' 3"  (160 cm) Weight: 108.9 kg (240 lb) IBW/kg (Calculated) : 52.4  Temp (24hrs), Avg:98.5 F (36.9 C), Min:98.2 F (36.8 C), Max:99 F (37.2 C)  Recent Labs  Lab 02/28/23 0034  WBC 10.6*  CREATININE 0.89    Estimated Creatinine Clearance: 65.7 mL/min (by C-G formula based on SCr of 0.89 mg/dL).    Allergies  Allergen Reactions   Codeine Itching   Morphine Itching   Sulindac Itching    Burnt spots    Antimicrobials this admission: Vanc 9/27>> CTX 9/27>>  Dose adjustments this admission: N/A  Microbiology results: Pending  Thank you for allowing pharmacy to be a part of this patient's care.  Keiondra Brookover, Drake Leach 02/28/2023 9:09 AM

## 2023-02-28 NOTE — H&P (Addendum)
- History and Physical    [name] HYQ:[id number] DOB: 1951-02-20 DOA: 02/27/2023  PCP: Merri Brunette, MD  Patient coming from: home  I have personally briefly reviewed patient's old medical records in [facility]  Chief Complaint: left knee pain   HPI: [name] is a 72 y.o. female with medical history significant of  diabetes type 2, hyperlipidemia  with interim history of hospitalization 9/16-9/19/24 with diagnoses of CAP and now presents to ED with complaint of left knee pain that started around 2 days after recent discharge. Patient states due to progression of pain she [facility] at which time she had joint aspiration.  Patient later that evening presented to ED due to intense pain with decrease mobility of  left knee and inability to ambulate due to pain. Per patient she has had no injury to knee, no fever/chills/ no current sob, notes cough is infrequent, no chest pain , no n/v/d or abdominal pain , no dysuria. NO hx of gout ,or inflammatory arthritis.  ED Course:  Vitals: sat 99%, bp 172/107, hr 118, rr 22 sat 98% on ra  Wbc 10.6, Hgb 11.1, plt 499 Na 138,K 3.7, gly 134 CRP 21.1 Sed 95 Fluid synovial: turbin pmn 99, gram statin + wbc no organism seen Tx oxycodone 10, hydromorphone  Review of Systems: As per HPI otherwise 10 point review of systems negative.   Past Medical History:  Diagnosis Date   Back pain    Diabetes mellitus without complication (HCC)    Type II   GERD (gastroesophageal reflux disease)    ocassional --takes tums   History of blood transfusion    with knee surgeries   Hyperlipemia    Joint pain     Past Surgical History:  Procedure Laterality Date   ABDOMINAL HYSTERECTOMY     BREAST BIOPSY Left 09/10/2022   MM LT BREAST BX W LOC DEV 1ST LESION IMAGE BX SPEC STEREO GUIDE 09/10/2022 [facility]   CHOLECYSTECTOMY     COLONOSCOPY     COLONOSCOPY WITH PROPOFOL N/A 08/26/2017   Procedure: COLONOSCOPY WITH  PROPOFOL;  Surgeon: Graylin Shiver, MD;  Location: [facility] ENDOSCOPY;  Service: Endoscopy;  Laterality: N/A;   LEG SURGERY Right    fracture   REPLACEMENT TOTAL KNEE Right    "5"- patient reports     reports that she quit smoking about 25 years ago. She started smoking about 26 years ago. She has never used smokeless tobacco. She reports that she does not drink alcohol and does not use drugs.  Allergies  Allergen Reactions   Codeine Itching   Morphine Itching   Sulindac Itching    Burnt spots    Family History  Problem Relation Age of Onset   Heart disease Mother    Cancer Father    Cancer Brother    Cirrhosis Brother     Prior to Admission medications   Medication Sig Start Date End Date Taking? Authorizing Provider  Accu-Chek FastClix Lancets MISC  04/08/22   [provider]  alendronate (FOSAMAX) 70 MG tablet Take 70 mg by mouth once a week. Monday    [provider]  aspirin (ASPIRIN EC) 81 MG EC tablet Take 81 mg by mouth daily.      [provider]  atorvastatin (LIPITOR) 20 MG tablet TAKE ONE TABLET BY MOUTH AT BEDTIME 04/07/15   Bosie Clos, MD  Calcium Carb-Cholecalciferol (CALCIUM 600/VITAMIN D3 PO) Take 1 tablet by  mouth daily.    [provider]  cholecalciferol (VITAMIN D3) 25 MCG (1000 UNIT) tablet Take 1,000 Units by mouth daily.    [provider]  furosemide (LASIX) 40 MG tablet Take 40 mg by mouth See admin instructions. Take 1 tablet by mouth 2 to 3 times a week    [provider]  metFORMIN (GLUCOPHAGE) 500 MG tablet Take 1 tablet (500 mg total) by mouth 2 (two) times daily with a meal. Patient taking differently: Take 1,000 mg by mouth 2 (two) times daily with a meal. 01/28/13   Kuneff, Renee A, DO  methocarbamol (ROBAXIN-750) 750 MG tablet Take 1 tablet (750 mg total) by mouth every 6 (six) hours as needed for muscle spasms (back pain). 02/20/23   Burnadette Pop, MD  Multiple Vitamins-Minerals (COMPLETE  MULTIVITAMIN/MINERAL PO) Take 1 tablet by mouth daily.    [provider]  Omega-3 Fatty Acids (FISH OIL PO) Take 1 capsule by mouth in the morning and at bedtime.    [provider]  pantoprazole (PROTONIX) 40 MG tablet Take 1 tablet (40 mg total) by mouth daily. 02/21/23   Burnadette Pop, MD  Semaglutide,0.25 or 0.5MG /DOS, (OZEMPIC, 0.25 OR 0.5 MG/DOSE,) 2 MG/1.5ML SOPN Inject 0.5 mg into the skin once a week. Sunday 06/05/21   [provider]  traMADol (ULTRAM) 50 MG tablet Take 50 mg by mouth 3 (three) times daily as needed. 01/25/22   [provider]    Physical Exam: Vitals:   02/27/23 2332 02/28/23 0146 02/28/23 0510 02/28/23 0539  BP: (!) 152/91 (!) 149/88 (!) 142/87   Pulse: (!) 106 (!) 113 (!) 109   Resp: (!) 28 20 18    Temp: 99 F (37.2 C) 98.5 F (36.9 C)  98.3 F (36.8 C)  TempSrc:  Oral  Oral  SpO2: 100% 93% 96%   Weight:      Height:        Constitutional: NAD, calm, comfortable Vitals:   02/27/23 2332 02/28/23 0146 02/28/23 0510 02/28/23 0539  BP: (!) 152/91 (!) 149/88 (!) 142/87   Pulse: (!) 106 (!) 113 (!) 109   Resp: (!) 28 20 18    Temp: 99 F (37.2 C) 98.5 F (36.9 C)  98.3 F (36.8 C)  TempSrc:  Oral  Oral  SpO2: 100% 93% 96%   Weight:      Height:       Eyes: PERRL, lids and conjunctivae normal ENMT: Mucous membranes are moist. Posterior pharynx clear of any exudate or lesions.Normal dentition.  Neck: normal, supple, no masses, no thyromegaly Respiratory: clear to auscultation bilaterally, no wheezing, no crackles. Normal respiratory effort. No accessory muscle use.  Cardiovascular: Regular rate and rhythm, no murmurs / rubs / gallops. No extremity edema. 2+ pedal pulses.  Abdomen: no tenderness, no masses palpated. No hepatosplenomegaly. Bowel sounds positive.  Musculoskeletal: no clubbing / cyanosis. Left knee tender along joint line, decrease ROM due to pain, minimal erythema, minimal warmth  + swelling.. Skin: no  rashes, lesions, ulcers. No induration Neurologic: CN 2-12 grossly intact. Sensation intact,  Strength 5/5 in all 4.  Psychiatric: Normal judgment and insight. Alert and oriented x 3. Normal mood.    Labs on Admission: I have personally reviewed following labs and imaging studies  CBC: Recent Labs  Lab 02/28/23 0034  WBC 10.6*  NEUTROABS 7.6  HGB 11.1*  HCT 37.6  MCV 85.8  PLT 499*   Basic Metabolic Panel: Recent Labs  Lab 02/28/23 0034  NA 138  K 3.7  CL 104  CO2 23  GLUCOSE 134*  BUN 14  CREATININE 0.89  CALCIUM 9.2   GFR: Estimated Creatinine Clearance: 65.7 mL/min (by C-G formula based on SCr of 0.89 mg/dL). Liver Function Tests: No results for input(s): "AST", "ALT", "ALKPHOS", "BILITOT", "PROT", "ALBUMIN" in the last 168 hours. No results for input(s): "LIPASE", "AMYLASE" in the last 168 hours. No results for input(s): "AMMONIA" in the last 168 hours. Coagulation Profile: No results for input(s): "INR", "PROTIME" in the last 168 hours. Cardiac Enzymes: No results for input(s): "CKTOTAL", "CKMB", "CKMBINDEX", "TROPONINI" in the last 168 hours. BNP (last 3 results) No results for input(s): "PROBNP" in the last 8760 hours. HbA1C: No results for input(s): "HGBA1C" in the last 72 hours. CBG: No results for input(s): "GLUCAP" in the last 168 hours. Lipid Profile: No results for input(s): "CHOL", "HDL", "LDLCALC", "TRIG", "CHOLHDL", "LDLDIRECT" in the last 72 hours. Thyroid Function Tests: No results for input(s): "TSH", "T4TOTAL", "FREET4", "T3FREE", "THYROIDAB" in the last 72 hours. Anemia Panel: No results for input(s): "VITAMINB12", "FOLATE", "FERRITIN", "TIBC", "IRON", "RETICCTPCT" in the last 72 hours. Urine analysis:    Component Value Date/Time   COLORURINE YELLOW 02/18/2023 0348   APPEARANCEUR CLEAR 02/18/2023 0348   LABSPEC 1.030 02/18/2023 0348   PHURINE 5.0 02/18/2023 0348   GLUCOSEU NEGATIVE 02/18/2023 0348   HGBUR NEGATIVE 02/18/2023 0348    BILIRUBINUR NEGATIVE 02/18/2023 0348   KETONESUR 5 (A) 02/18/2023 0348   PROTEINUR NEGATIVE 02/18/2023 0348   UROBILINOGEN 0.2 12/14/2010 0930   NITRITE NEGATIVE 02/18/2023 0348   LEUKOCYTESUR NEGATIVE 02/18/2023 0348    Radiological Exams on Admission: No results found.  EKG: Independently reviewed. N/a  Assessment/Plan  Left knee swelling and pain r/o septic joint  -admit to tele  - continue to trend inflammatory markers - vanc/ctx , de-escalate base on cultures  - await ortho evaluation  -check uric acid  -supportive pain management   Diabetes type 2 -iss/fs   Hyperlipidemia  -resume statin    Hx of recent CAP -s/p abx  -no current respiratory symptoms, now resolved     DVT prophylaxis: Heparin Code Status: full/ as discussed per patient wishes in event of cardiac arrest  Family Communication:  none at bedside Disposition Plan: patient  expected to be admitted greater than 2 midnights  Consults called: Orthopedics Dr Charlann Boxer Admission status: med tele    Lurline Del MD [facility]   If 7PM-7AM, please contact night-coverage [email or web address] Password [facility]  02/28/2023, 7:43 AM

## 2023-03-01 DIAGNOSIS — M25562 Pain in left knee: Secondary | ICD-10-CM | POA: Diagnosis not present

## 2023-03-01 LAB — COMPREHENSIVE METABOLIC PANEL
ALT: 15 U/L (ref 0–44)
AST: 17 U/L (ref 15–41)
Albumin: 2.1 g/dL — ABNORMAL LOW (ref 3.5–5.0)
Alkaline Phosphatase: 83 U/L (ref 38–126)
Anion gap: 10 (ref 5–15)
BUN: 11 mg/dL (ref 8–23)
CO2: 23 mmol/L (ref 22–32)
Calcium: 8.7 mg/dL — ABNORMAL LOW (ref 8.9–10.3)
Chloride: 102 mmol/L (ref 98–111)
Creatinine, Ser: 0.8 mg/dL (ref 0.44–1.00)
GFR, Estimated: >60 mL/min (ref >60)
Glucose, Bld: 119 mg/dL — ABNORMAL HIGH (ref 70–99)
Potassium: 3.6 mmol/L (ref 3.5–5.1)
Sodium: 135 mmol/L (ref 135–145)
Total Bilirubin: 0.3 mg/dL (ref 0.3–1.2)
Total Protein: 6.4 g/dL — ABNORMAL LOW (ref 6.5–8.1)

## 2023-03-01 LAB — CBC
HCT: 33.9 % — ABNORMAL LOW (ref 36.0–46.0)
Hemoglobin: 10.1 g/dL — ABNORMAL LOW (ref 12.0–15.0)
MCH: 25.2 pg — ABNORMAL LOW (ref 26.0–34.0)
MCHC: 29.8 g/dL — ABNORMAL LOW (ref 30.0–36.0)
MCV: 84.5 fL (ref 80.0–100.0)
Platelets: 444 10*3/uL — ABNORMAL HIGH (ref 150–400)
RBC: 4.01 MIL/uL (ref 3.87–5.11)
RDW: 13.5 % (ref 11.5–15.5)
WBC: 10.6 10*3/uL — ABNORMAL HIGH (ref 4.0–10.5)
nRBC: 0 % (ref 0.0–0.2)

## 2023-03-01 LAB — GLUCOSE, CAPILLARY
Glucose-Capillary: 109 mg/dL — ABNORMAL HIGH (ref 70–99)
Glucose-Capillary: 121 mg/dL — ABNORMAL HIGH (ref 70–99)
Glucose-Capillary: 103 mg/dL — ABNORMAL HIGH (ref 70–99)
Glucose-Capillary: 132 mg/dL — ABNORMAL HIGH (ref 70–99)

## 2023-03-01 LAB — PROTIME-INR
INR: 1.2 (ref 0.8–1.2)
Prothrombin Time: 15.6 s — ABNORMAL HIGH (ref 11.4–15.2)

## 2023-03-01 MED ORDER — SODIUM CHLORIDE 0.9 % IV BOLUS
500.0000 mL | Freq: Once | INTRAVENOUS | Status: AC
  Administered 2023-03-01: 500 mL via INTRAVENOUS

## 2023-03-01 MED ORDER — IBUPROFEN 200 MG PO TABS: 600.0000 mg | ORAL_TABLET | Freq: Four times a day (QID) | ORAL | Status: DC | PRN

## 2023-03-01 MED ORDER — IBUPROFEN 200 MG PO TABS
400.0000 mg | ORAL_TABLET | Freq: Four times a day (QID) | ORAL | Status: DC | PRN
  Administered 2023-03-01 – 2023-03-02 (×2): 400 mg via ORAL
  Filled 2023-03-01 (×2): qty 2

## 2023-03-01 NOTE — Progress Notes (Signed)
   03/01/23 0725  Assess: MEWS Score  Temp 99.2 F (37.3 C)  BP (!) 151/70  MAP (mmHg) 91  Pulse Rate (!) 112  Resp 20  SpO2 94 %  O2 Device Room Air  Assess: MEWS Score  MEWS Temp 0  MEWS Systolic 0  MEWS Pulse 2  MEWS RR 0  MEWS LOC 0  MEWS Score 2  MEWS Score Color Yellow  Assess: if the MEWS score is Yellow or Red  Were vital signs accurate and taken at a resting state? Yes  Does the patient meet 2 or more of the SIRS criteria? No  MEWS guidelines implemented  Yes, yellow  Treat  MEWS Interventions Considered administering scheduled or prn medications/treatments as ordered  Take Vital Signs  Increase Vital Sign Frequency  Yellow: Q2hr x1, continue Q4hrs until patient remains green for 12hrs  Escalate  MEWS: Escalate Yellow: Discuss with charge nurse and consider notifying provider and/or RRT  Notify: Charge Nurse/RN  Name of Charge Nurse/RN Notified EvansRN  Provider Notification  Provider Name/Title Willeen Niece  Date Provider Notified 03/01/23  Time Provider Notified [phone]  Method of Notification Page  Notification Reason Other (Comment)  Provider response See new orders  Date of Provider Response 03/01/23  Time of Provider Response 0800  Assess: SIRS CRITERIA  SIRS Temperature  0  SIRS Pulse 1  SIRS Respirations  0  SIRS WBC 0  SIRS Score Sum  1   Pt seen in room alert/oriented in no apparent distress, denies any chest pain/discomfort at this time.

## 2023-03-01 NOTE — Progress Notes (Signed)
PROGRESS NOTE    [name]  ZOX:[id number] DOB: May 10, 1951 DOA: 02/27/2023 PCP: Merri Brunette, MD  Brief Narrative:  This 72 yrs old female with PMH significant for diabetes type 2, hyperlipidemia  with recent hospitalization from 9/16-9/19/24 with diagnoses of CAP and now presents to ED with complaints of left knee pain that started around 2 days after recent discharge.  Patient has followed up with [facility] on 9/26 at which time she had joint aspiration.  Patient later that evening presented to the ED due to intense pain with decreased mobility of the left knee and inability to ambulate due to pain.  Workup in the ED reveals elevated CRP, ESR, patient admitted for further evaluation.  Started on empiric antibiotics.  Assessment & Plan:   Principal Problem:   Left anterior knee pain  Left knee swelling/pain rule out septic joint: Patient presented with left knee pain 2 days following discharge from hospital for pneumonia. Patient underwent arthrocentesis in [facility] clinic. Follow-up synovial fluid analysis. Elevated CRP and ESR. Continue to trend inflammatory markers Continue vancomycin and ceftriaxone.   De-escalate antibiotic based on cultures. Ortho consulted, no plans for any surgical intervention unless aspirate shows signs concerning for infection. Uric acid normal. Continue adequate pain control.   Type 2 diabetes Hold p.o. diabetic medications. Continue regular insulin sliding scale   Hyperlipidemia  Continue Lipitor   Hx of recent CAP: Patient has completed antibiotics course for pneumonia. No current respiratory symptoms, now resolved .   DVT prophylaxis: Heparin Code Status:Full code Family Communication: No family at bed side. Disposition Plan:    Status is: Inpatient Remains inpatient appropriate because: Admitted for left knee swelling and pain concerning for septic arthritis.    Consultants:   Orthopaedics  Procedures:Arthrocentesis  Antimicrobials:  Anti-infectives (From admission, onward)    Start     Dose/Rate Route Frequency Ordered Stop   03/01/23 1000  vancomycin (VANCOREADY) IVPB 1250 mg/250 mL        1,250 mg 166.7 mL/hr over 90 Minutes Intravenous Every 24 hours 02/28/23 0909     02/28/23 0930  cefTRIAXone (ROCEPHIN) 1 g in sodium chloride 0.9 % 100 mL IVPB  Status:  Discontinued        1 g 200 mL/hr over 30 Minutes Intravenous Every 24 hours 02/28/23 0900 02/28/23 0905   02/28/23 0930  cefTRIAXone (ROCEPHIN) 2 g in sodium chloride 0.9 % 100 mL IVPB        2 g 200 mL/hr over 30 Minutes Intravenous Every 24 hours 02/28/23 0905     02/28/23 0915  vancomycin (VANCOREADY) IVPB 2000 mg/400 mL        2,000 mg 200 mL/hr over 120 Minutes Intravenous  Once 02/28/23 0909 02/28/23 1336      Subjective: Patient was seen and examined at bedside.  Overnight events noted.   She complains about left knee pain.  She states she is unable to bend her left knee. She denies any fever, chills or cough.  Objective: Vitals:   02/28/23 1934 03/01/23 0417 03/01/23 0725 03/01/23 0904  BP: 128/60 (!) 160/86 (!) 151/70 (!) 151/72  Pulse: 78 100 (!) 112 (!) 106  Resp: 15 16 20    Temp: 98.3 F (36.8 C) 98.3 F (36.8 C) 99.2 F (37.3 C) 99 F (37.2 C)  TempSrc:  Oral Oral   SpO2: 95% 97% 94% 91%  Weight:      Height:        Intake/Output Summary (Last 24 hours) at 03/01/2023 1135  Last data filed at 03/01/2023 0830 Gross per 24 hour  Intake 980 ml  Output 500 ml  Net 480 ml   Filed Weights   02/27/23 1807  Weight: 108.9 kg    Examination:  General exam: Appears calm and comfortable, deconditioned, not in any acute distress. Respiratory system: CTA bilaterally . respiratory effort normal.  RR 15 Cardiovascular system: S1 & S2 heard, RRR. No murmer, No pedal edema. Gastrointestinal system: Abdomen is non distended, soft and non tender.  Normal bowel sounds  heard. Central nervous system: Alert and oriented x 3. No focal neurological deficits. Extremities: Left knee tenderness, unable to bend. Skin: No rashes, lesions or ulcers Psychiatry: Judgement and insight appear normal. Mood & affect appropriate.     Data Reviewed: I have personally reviewed following labs and imaging studies  CBC: Recent Labs  Lab 02/28/23 0034 02/28/23 0937 03/01/23 0440  WBC 10.6* 10.2 10.6*  NEUTROABS 7.6  --   --   HGB 11.1* 10.4* 10.1*  HCT 37.6 35.0* 33.9*  MCV 85.8 86.4 84.5  PLT 499* 484* 444*   Basic Metabolic Panel: Recent Labs  Lab 02/28/23 0034 02/28/23 0937 03/01/23 0440  NA 138  --  135  K 3.7  --  3.6  CL 104  --  102  CO2 23  --  23  GLUCOSE 134*  --  119*  BUN 14  --  11  CREATININE 0.89 0.83 0.80  CALCIUM 9.2  --  8.7*   GFR: Estimated Creatinine Clearance: 73 mL/min (by C-G formula based on SCr of 0.8 mg/dL). Liver Function Tests: Recent Labs  Lab 03/01/23 0440  AST 17  ALT 15  ALKPHOS 83  BILITOT 0.3  PROT 6.4*  ALBUMIN 2.1*   No results for input(s): "LIPASE", "AMYLASE" in the last 168 hours. No results for input(s): "AMMONIA" in the last 168 hours. Coagulation Profile: Recent Labs  Lab 03/01/23 0440  INR 1.2   Cardiac Enzymes: No results for input(s): "CKTOTAL", "CKMB", "CKMBINDEX", "TROPONINI" in the last 168 hours. BNP (last 3 results) No results for input(s): "PROBNP" in the last 8760 hours. HbA1C: No results for input(s): "HGBA1C" in the last 72 hours. CBG: Recent Labs  Lab 02/28/23 1148 02/28/23 1638 02/28/23 2157 03/01/23 0722  GLUCAP 123* 116* 128* 109*   Lipid Profile: No results for input(s): "CHOL", "HDL", "LDLCALC", "TRIG", "CHOLHDL", "LDLDIRECT" in the last 72 hours. Thyroid Function Tests: No results for input(s): "TSH", "T4TOTAL", "FREET4", "T3FREE", "THYROIDAB" in the last 72 hours. Anemia Panel: No results for input(s): "VITAMINB12", "FOLATE", "FERRITIN", "TIBC", "IRON",  "RETICCTPCT" in the last 72 hours. Sepsis Labs: Recent Labs  Lab 02/28/23 0937  PROCALCITON <0.10    Recent Results (from the past 240 hour(s))  Body fluid culture w Gram Stain     Status: None (Preliminary result)   Collection Time: 02/28/23  3:40 AM   Specimen: Synovium; Body Fluid  Result Value Ref Range Status   Specimen Description SYNOVIAL FLUID  Final   Special Requests LEFT KNEE  Final   Gram Stain   Final    FEW WBC PRESENT, PREDOMINANTLY PMN NO ORGANISMS SEEN    Culture   Final    NO GROWTH 1 DAY Performed at [facility], [address] [place], [place] [address]    Report Status PENDING  Incomplete  Culture, blood (Routine X 2) w Reflex to ID Panel     Status: None (Preliminary result)   Collection Time: 02/28/23  9:36 AM  Specimen: BLOOD  Result Value Ref Range Status   Specimen Description BLOOD BLOOD RIGHT HAND  Final   Special Requests   Final    BOTTLES DRAWN AEROBIC AND ANAEROBIC Blood Culture adequate volume   Culture   Final    NO GROWTH < 24 HOURS Performed at [facility], [address], [place], [place] [address]    Report Status PENDING  Incomplete  Culture, blood (Routine X 2) w Reflex to ID Panel     Status: None (Preliminary result)   Collection Time: 02/28/23 10:47 AM   Specimen: BLOOD  Result Value Ref Range Status   Specimen Description BLOOD BLOOD RIGHT HAND  Final   Special Requests   Final    BOTTLES DRAWN AEROBIC AND ANAEROBIC Blood Culture adequate volume   Culture   Final    NO GROWTH < 24 HOURS Performed at [facility], [address] [place], [place] [address]    Report Status PENDING  Incomplete    Radiology Studies: No results found.  Scheduled Meds:  aspirin EC  81 mg Oral Daily   atorvastatin  20 mg Oral QHS   heparin  5,000 Units Subcutaneous Q8H   insulin aspart  0-15 Units Subcutaneous TID WC   pantoprazole  40 mg Oral Daily   Continuous Infusions:  cefTRIAXone (ROCEPHIN)  IV 2 g (03/01/23  1018)   vancomycin 1,250 mg (03/01/23 1100)     LOS: 1 day    Time spent: 50 mins    Willeen Niece, MD [facility]   If 7PM-7AM, please contact night-coverage

## 2023-03-01 NOTE — Plan of Care (Signed)

## 2023-03-02 DIAGNOSIS — M25562 Pain in left knee: Secondary | ICD-10-CM | POA: Diagnosis not present

## 2023-03-02 LAB — GLUCOSE, BODY FLUID OTHER: Glucose, Body Fluid Other: 5 mg/dL

## 2023-03-02 LAB — GLUCOSE, CAPILLARY
Glucose-Capillary: 157 mg/dL — ABNORMAL HIGH (ref 70–99)
Glucose-Capillary: 133 mg/dL — ABNORMAL HIGH (ref 70–99)
Glucose-Capillary: 234 mg/dL — ABNORMAL HIGH (ref 70–99)
Glucose-Capillary: 136 mg/dL — ABNORMAL HIGH (ref 70–99)

## 2023-03-02 LAB — PROTEIN, BODY FLUID (OTHER): Total Protein, Body Fluid Other: 4.8 g/dL

## 2023-03-02 LAB — D-DIMER, QUANTITATIVE: D-Dimer, Quant: 1.69 ug{FEU}/mL — ABNORMAL HIGH (ref 0.00–0.50)

## 2023-03-02 NOTE — Progress Notes (Signed)
PROGRESS NOTE    [name]  WGN:[id number] DOB: 1950-09-16 DOA: 02/27/2023 PCP: Merri Brunette, MD  Brief Narrative:  This 72 yrs old female with PMH significant for diabetes type 2, hyperlipidemia  with recent hospitalization from 9/16-9/19/24 with diagnoses of CAP and now presents to ED with complaints of left knee pain that started around 2 days after recent discharge.  Patient has followed up with [facility] on 9/26 at which time she had joint aspiration.  Patient later that evening presented to the ED due to intense pain with decreased mobility of the left knee and inability to ambulate due to pain.  Workup in the ED reveals elevated CRP, ESR, patient admitted for further evaluation.  Started on empiric antibiotics.  Assessment & Plan:   Principal Problem:   Left anterior knee pain  Left knee swelling/pain,  rule out septic joint: Patient presented with left knee pain 2 days following discharge from hospital for pneumonia. Patient underwent arthrocentesis in [facility] clinic. Follow-up synovial fluid analysis. Elevated CRP and ESR. Continue to trend inflammatory markers Continue vancomycin and ceftriaxone.   De-escalate antibiotic based on cultures. Ortho consulted, no plans for any surgical intervention unless aspirate shows signs concerning for infection. Uric acid normal. Continue adequate pain control.   Type 2 diabetes: Hold p.o. diabetic medications. Continue regular insulin sliding scale. Carb modified diet.   Hyperlipidemia : Continue Lipitor   Hx of recent CAP: Patient has completed antibiotics course for pneumonia. No current respiratory symptoms, now resolved .  Tachycardia: Patient was tachycardic yesterday, which is now improved. D-dimer slightly elevated, If patient continued to remain tachycardic,  consider CTA chest rule out PE   DVT prophylaxis: Heparin Code Status:Full code Family Communication: No family at bed side. Disposition Plan:     Status is: Inpatient Remains inpatient appropriate because: Admitted for left knee swelling and pain concerning for septic arthritis.  Orthopedic consult pending    Consultants:  Orthopaedics  Procedures:Arthrocentesis  Antimicrobials:  Anti-infectives (From admission, onward)    Start     Dose/Rate Route Frequency Ordered Stop   03/01/23 1000  vancomycin (VANCOREADY) IVPB 1250 mg/250 mL        1,250 mg 166.7 mL/hr over 90 Minutes Intravenous Every 24 hours 02/28/23 0909     02/28/23 0930  cefTRIAXone (ROCEPHIN) 1 g in sodium chloride 0.9 % 100 mL IVPB  Status:  Discontinued        1 g 200 mL/hr over 30 Minutes Intravenous Every 24 hours 02/28/23 0900 02/28/23 0905   02/28/23 0930  cefTRIAXone (ROCEPHIN) 2 g in sodium chloride 0.9 % 100 mL IVPB        2 g 200 mL/hr over 30 Minutes Intravenous Every 24 hours 02/28/23 0905     02/28/23 0915  vancomycin (VANCOREADY) IVPB 2000 mg/400 mL        2,000 mg 200 mL/hr over 120 Minutes Intravenous  Once 02/28/23 0909 02/28/23 1336      Subjective: Patient was seen and examined at bedside. Overnight events noted.   Patient complains of left knee pain and states she is unable to bend her left knee. She denies any fever,  chills or cough.  Objective: Vitals:   03/01/23 1631 03/01/23 2007 03/02/23 0433 03/02/23 0817  BP: (!) 155/78 135/75 139/80   Pulse: (!) 104 (!) 105 93   Resp: 19 18 18    Temp: 99.4 F (37.4 C) 99.3 F (37.4 C) 98.4 F (36.9 C) 98.4 F (36.9 C)  TempSrc:  Oral  SpO2: 100% 99% 97%   Weight:      Height:        Intake/Output Summary (Last 24 hours) at 03/02/2023 1058 Last data filed at 03/01/2023 1915 Gross per 24 hour  Intake 360 ml  Output --  Net 360 ml   Filed Weights   02/27/23 1807  Weight: 108.9 kg    Examination:  General exam: Appears comfortable, calm, deconditioned, not in any acute distress. Respiratory system: CTA bilaterally . respiratory effort normal.  RR 14 Cardiovascular system:  S1 & S2 heard, RRR. No murmer, No pedal edema. Gastrointestinal system: Abdomen is non distended, soft and non tender.  Normal bowel sounds heard. Central nervous system: Alert and oriented x 3. No focal neurological deficits. Extremities: Left knee tenderness, unable to bend. Skin: No rashes, lesions or ulcers Psychiatry: Judgement and insight appear normal. Mood & affect appropriate.     Data Reviewed: I have personally reviewed following labs and imaging studies  CBC: Recent Labs  Lab 02/28/23 0034 02/28/23 0937 03/01/23 0440  WBC 10.6* 10.2 10.6*  NEUTROABS 7.6  --   --   HGB 11.1* 10.4* 10.1*  HCT 37.6 35.0* 33.9*  MCV 85.8 86.4 84.5  PLT 499* 484* 444*   Basic Metabolic Panel: Recent Labs  Lab 02/28/23 0034 02/28/23 0937 03/01/23 0440  NA 138  --  135  K 3.7  --  3.6  CL 104  --  102  CO2 23  --  23  GLUCOSE 134*  --  119*  BUN 14  --  11  CREATININE 0.89 0.83 0.80  CALCIUM 9.2  --  8.7*   GFR: Estimated Creatinine Clearance: 73 mL/min (by C-G formula based on SCr of 0.8 mg/dL). Liver Function Tests: Recent Labs  Lab 03/01/23 0440  AST 17  ALT 15  ALKPHOS 83  BILITOT 0.3  PROT 6.4*  ALBUMIN 2.1*   No results for input(s): "LIPASE", "AMYLASE" in the last 168 hours. No results for input(s): "AMMONIA" in the last 168 hours. Coagulation Profile: Recent Labs  Lab 03/01/23 0440  INR 1.2   Cardiac Enzymes: No results for input(s): "CKTOTAL", "CKMB", "CKMBINDEX", "TROPONINI" in the last 168 hours. BNP (last 3 results) No results for input(s): "PROBNP" in the last 8760 hours. HbA1C: No results for input(s): "HGBA1C" in the last 72 hours. CBG: Recent Labs  Lab 03/01/23 0722 03/01/23 1143 03/01/23 1629 03/01/23 2009 03/02/23 0819  GLUCAP 109* 121* 103* 132* 157*   Lipid Profile: No results for input(s): "CHOL", "HDL", "LDLCALC", "TRIG", "CHOLHDL", "LDLDIRECT" in the last 72 hours. Thyroid Function Tests: No results for input(s): "TSH",  "T4TOTAL", "FREET4", "T3FREE", "THYROIDAB" in the last 72 hours. Anemia Panel: No results for input(s): "VITAMINB12", "FOLATE", "FERRITIN", "TIBC", "IRON", "RETICCTPCT" in the last 72 hours. Sepsis Labs: Recent Labs  Lab 02/28/23 0937  PROCALCITON <0.10    Recent Results (from the past 240 hour(s))  Body fluid culture w Gram Stain     Status: None (Preliminary result)   Collection Time: 02/28/23  3:40 AM   Specimen: Synovium; Body Fluid  Result Value Ref Range Status   Specimen Description SYNOVIAL FLUID  Final   Special Requests LEFT KNEE  Final   Gram Stain   Final    FEW WBC PRESENT, PREDOMINANTLY PMN NO ORGANISMS SEEN    Culture   Final    NO GROWTH 1 DAY Performed at [facility], [address] [place], [place] [address]    Report  Status PENDING  Incomplete  Culture, blood (Routine X 2) w Reflex to ID Panel     Status: None (Preliminary result)   Collection Time: 02/28/23  9:36 AM   Specimen: BLOOD  Result Value Ref Range Status   Specimen Description BLOOD BLOOD RIGHT HAND  Final   Special Requests   Final    BOTTLES DRAWN AEROBIC AND ANAEROBIC Blood Culture adequate volume   Culture   Final    NO GROWTH 2 DAYS Performed at [facility], [address] [place], [place] [address]    Report Status PENDING  Incomplete  Culture, blood (Routine X 2) w Reflex to ID Panel     Status: None (Preliminary result)   Collection Time: 02/28/23 10:47 AM   Specimen: BLOOD  Result Value Ref Range Status   Specimen Description BLOOD BLOOD RIGHT HAND  Final   Special Requests   Final    BOTTLES DRAWN AEROBIC AND ANAEROBIC Blood Culture adequate volume   Culture   Final    NO GROWTH 2 DAYS Performed at [facility], [address] [place], [place] [address]    Report Status PENDING  Incomplete    Radiology Studies: No results found.  Scheduled Meds:  aspirin EC  81 mg Oral Daily   atorvastatin  20 mg Oral QHS   heparin  5,000 Units Subcutaneous Q8H    insulin aspart  0-15 Units Subcutaneous TID WC   pantoprazole  40 mg Oral Daily   Continuous Infusions:  cefTRIAXone (ROCEPHIN)  IV 2 g (03/02/23 0826)   vancomycin 1,250 mg (03/02/23 1000)     LOS: 2 days    Time spent: 35 mins    Willeen Niece, MD [facility]   If 7PM-7AM, please contact night-coverage

## 2023-03-02 NOTE — Progress Notes (Signed)
       CROSS COVER NOTE  NAME: [name] MRN: [id number] DOB : Feb 09, 1951    Concern as stated by nurse / staff   Message received from nurse  Non sustained SVT event overnight   Pertinent findings on chart review: Admitted with left knee pain r.o septic join - recently hospitalized on 9/16-9/19 for pneumonia  Assessment and  Interventions   Assessment:    03/02/2023    4:33 AM 03/01/2023    8:07 PM 03/01/2023    4:31 PM  Vitals with BMI  Systolic 139 135 086  Diastolic 80 75 78  Pulse 93 105 104   Afebrile on room air Plan: Check d dimer       Donnie Mesa NP [facility] Cross Cover 7pm-7am - check amion for availability Pager [phone]

## 2023-03-03 ENCOUNTER — Inpatient Hospital Stay (HOSPITAL_COMMUNITY): Payer: Medicare PPO

## 2023-03-03 DIAGNOSIS — M25562 Pain in left knee: Secondary | ICD-10-CM | POA: Diagnosis not present

## 2023-03-03 LAB — BODY FLUID CULTURE W GRAM STAIN

## 2023-03-03 LAB — GLUCOSE, CAPILLARY
Glucose-Capillary: 134 mg/dL — ABNORMAL HIGH (ref 70–99)
Glucose-Capillary: 112 mg/dL — ABNORMAL HIGH (ref 70–99)
Glucose-Capillary: 143 mg/dL — ABNORMAL HIGH (ref 70–99)
Glucose-Capillary: 135 mg/dL — ABNORMAL HIGH (ref 70–99)

## 2023-03-03 MED ORDER — KETOROLAC TROMETHAMINE 15 MG/ML IJ SOLN
7.5000 mg | Freq: Four times a day (QID) | INTRAMUSCULAR | Status: AC
  Administered 2023-03-03 – 2023-03-04 (×4): 7.5 mg via INTRAVENOUS
  Filled 2023-03-03 (×4): qty 1

## 2023-03-03 MED ORDER — IOHEXOL 350 MG/ML SOLN
75.0000 mL | Freq: Once | INTRAVENOUS | Status: AC | PRN
  Administered 2023-03-03: 75 mL via INTRAVENOUS

## 2023-03-03 NOTE — Consult Note (Signed)
Value[facility] Institute   [facility] Inpatient Consult   03/03/2023  KIMBERLEE SHOUN Oct 11, 1950 [id number]  [facility] [[facility]]  Accountable Care Organization [ACO] Patient:  Primary Care Provider: Merri Brunette, MD, [facility], provider is listed for the transition of care follow up appointments and calls   The patient was screened for [facility] Liaison 7 and 30 day Readmission:[id number]} day readmission hospitalization with noted [facility] Liaison Readmission Risk Score:[id number]} risk score for unplanned readmission risk ***hospital admissions in 6 months.  The patient was assessed for potential [facility]) Care Management service needs for post hospital transition for care coordination. Review of patient's electronic medical record reveals patient is ***.   Plan: [facility] Liaison will continue to follow progress and disposition to asess for post hospital community care coordination/management needs.  Referral request for community care coordination: ***   Community Care Management/Population Health does not replace or interfere with any arrangements made by the Inpatient Transition of Care team.   For questions contact:   Charlesetta Shanks, RN, BSN, CCM [place]  [facility] Liaison Direct Dial: [phone] or secure chat Website: [email or web address]

## 2023-03-03 NOTE — Progress Notes (Addendum)
Synovial fluid analysis results received today from the in office aspiration on 9/26.  WBC of 56K and 77% neutrophils, with monosodium urate crystals found on original aspiration.  Recommend IV toradol with discharge home on an oral NSAID.  No concerns for joint infection still based on results. We will see her in the AM to discuss.   Rosalene Billings, PA-C Orthopedic Surgery [facility] [phone]

## 2023-03-03 NOTE — Plan of Care (Signed)
  Problem: Nutrition: Goal: Adequate nutrition will be maintained Outcome: Progressing   Problem: Pain Managment: Goal: General experience of comfort will improve Outcome: Not Progressing   

## 2023-03-03 NOTE — Care Management Important Message (Signed)
Important Message  Patient Details  Name: [name] MRN: [id number] Date of Birth: [birth year]   Important Message Given:  Yes - Medicare IM     Sherilyn Banker 03/03/2023, 12:29 PM

## 2023-03-03 NOTE — Progress Notes (Signed)
Pharmacy Antibiotic Note  [name] is a 72 y.o. female admitted on 02/27/2023 with  possible septic joint .  Pharmacy has been consulted for vancomycin dosing. Pt is afebrile and WBC is slightly elevated.   Cultures are negative to date  Plan: Continue Vancomycin 1250mg  IV Q24H F/u renal fxn, C&S, clinical status and peak/trough at SS  Height: 5\' 3"  (160 cm) Weight: 108.9 kg (240 lb) IBW/kg (Calculated) : 52.4  Temp (24hrs), Avg:98 F (36.7 C), Min:98 F (36.7 C), Max:98 F (36.7 C)  Recent Labs  Lab 02/28/23 0034 02/28/23 0937 03/01/23 0440  WBC 10.6* 10.2 10.6*  CREATININE 0.89 0.83 0.80    Estimated Creatinine Clearance: 73 mL/min (by C-G formula based on SCr of 0.8 mg/dL).    Allergies  Allergen Reactions   Codeine Itching   Morphine Itching   Sulindac Itching    Burnt spots    Antimicrobials this admission: Vanc 9/27>> CTX 9/27>>  Dose adjustments this admission: N/A  Microbiology results: Pending  Thank you for allowing pharmacy to be a part of this patient's care.  Elwin Sleight 03/03/2023 9:09 AM

## 2023-03-03 NOTE — Progress Notes (Signed)
PROGRESS NOTE    [name]  ZOX:[id number] DOB: December 29, 1950 DOA: 02/27/2023 PCP: Merri Brunette, MD  Brief Narrative:  This 72 yrs old female with PMH significant for diabetes type 2, hyperlipidemia  with recent hospitalization from 9/16-9/19/24 with diagnoses of CAP and now presents to ED with complaints of left knee pain that started around 2 days after recent discharge.  Patient has followed up with [facility] on 9/26 at which time she had joint aspiration.  Patient later that evening presented to the ED due to intense pain with decreased mobility of the left knee and inability to ambulate due to pain.  Workup in the ED reveals elevated CRP, ESR, patient admitted for further evaluation.  Started on empiric antibiotics.  Assessment & Plan:   Principal Problem:   Left anterior knee pain  Left knee swelling/pain,  rule out septic joint: Patient presented with left knee pain 2 days following discharge from hospital for pneumonia. Patient underwent arthrocentesis in [facility] clinic. Follow-up synovial fluid analysis. Elevated CRP and ESR. Continue to trend inflammatory markers Continue vancomycin and ceftriaxone.   De-escalate antibiotic based on cultures. Ortho consulted, no plans for any surgical intervention unless aspirate shows signs concerning for infection.  Uric acid normal. Continue adequate pain control.   Type 2 diabetes: Hold p.o. diabetic medications. Continue regular insulin sliding scale. Carb modified diet.   Hyperlipidemia : Continue Lipitor   Hx of recent CAP: Patient has completed antibiotics course for pneumonia. No current respiratory symptoms, now resolved .  Tachycardia: Patient continued to remain tachycardic and tachypneic. D-dimer slightly elevated, Since obtain CTA to rule out pulmonary embolism. Obtain Bilateral LE venous duplex.    DVT prophylaxis: Heparin Code Status:Full code Family Communication: No family at bed side. Disposition  Plan:    Status is: Inpatient Remains inpatient appropriate because: Admitted for left knee swelling and pain concerning for septic arthritis.  Orthopedic consult pending.     Consultants:  Orthopaedics  Procedures:Arthrocentesis  Antimicrobials:  Anti-infectives (From admission, onward)    Start     Dose/Rate Route Frequency Ordered Stop   03/01/23 1000  vancomycin (VANCOREADY) IVPB 1250 mg/250 mL        1,250 mg 166.7 mL/hr over 90 Minutes Intravenous Every 24 hours 02/28/23 0909     02/28/23 0930  cefTRIAXone (ROCEPHIN) 1 g in sodium chloride 0.9 % 100 mL IVPB  Status:  Discontinued        1 g 200 mL/hr over 30 Minutes Intravenous Every 24 hours 02/28/23 0900 02/28/23 0905   02/28/23 0930  cefTRIAXone (ROCEPHIN) 2 g in sodium chloride 0.9 % 100 mL IVPB        2 g 200 mL/hr over 30 Minutes Intravenous Every 24 hours 02/28/23 0905     02/28/23 0915  vancomycin (VANCOREADY) IVPB 2000 mg/400 mL        2,000 mg 200 mL/hr over 120 Minutes Intravenous  Once 02/28/23 0909 02/28/23 1336      Subjective: Patient was seen and examined at bedside. Overnight events noted.   Patient complains of left knee pain and is stated she is unable to bend her left knee. She reports significant pain in the left leg.  Objective: Vitals:   03/02/23 1511 03/02/23 2017 03/03/23 0340 03/03/23 0700  BP: 128/76 (!) 134/59 (!) 147/80 133/82  Pulse: 97 97 (!) 110   Resp: 18  20   Temp: 98 F (36.7 C)   98 F (36.7 C)  TempSrc: Oral   Oral  SpO2: 97% 100% 100%   Weight:      Height:        Intake/Output Summary (Last 24 hours) at 03/03/2023 1201 Last data filed at 03/03/2023 0900 Gross per 24 hour  Intake 650 ml  Output 1250 ml  Net -600 ml   Filed Weights   02/27/23 1807  Weight: 108.9 kg    Examination:  General exam: Appears comfortable, deconditioned, calm, not in any acute distress. Respiratory system: CTA bilaterally . respiratory effort normal.  RR 13 Cardiovascular system: S1  & S2 heard, RRR. No murmer, No pedal edema. Gastrointestinal system: Abdomen is non distended, soft and non tender.  Normal bowel sounds heard. Central nervous system: Alert and oriented x 3. No focal neurological deficits. Extremities: Left knee tenderness, unable to bend. Skin: No rashes, lesions or ulcers Psychiatry: Judgement and insight appear normal. Mood & affect appropriate.     Data Reviewed: I have personally reviewed following labs and imaging studies  CBC: Recent Labs  Lab 02/28/23 0034 02/28/23 0937 03/01/23 0440  WBC 10.6* 10.2 10.6*  NEUTROABS 7.6  --   --   HGB 11.1* 10.4* 10.1*  HCT 37.6 35.0* 33.9*  MCV 85.8 86.4 84.5  PLT 499* 484* 444*   Basic Metabolic Panel: Recent Labs  Lab 02/28/23 0034 02/28/23 0937 03/01/23 0440  NA 138  --  135  K 3.7  --  3.6  CL 104  --  102  CO2 23  --  23  GLUCOSE 134*  --  119*  BUN 14  --  11  CREATININE 0.89 0.83 0.80  CALCIUM 9.2  --  8.7*   GFR: Estimated Creatinine Clearance: 73 mL/min (by C-G formula based on SCr of 0.8 mg/dL). Liver Function Tests: Recent Labs  Lab 03/01/23 0440  AST 17  ALT 15  ALKPHOS 83  BILITOT 0.3  PROT 6.4*  ALBUMIN 2.1*   No results for input(s): "LIPASE", "AMYLASE" in the last 168 hours. No results for input(s): "AMMONIA" in the last 168 hours. Coagulation Profile: Recent Labs  Lab 03/01/23 0440  INR 1.2   Cardiac Enzymes: No results for input(s): "CKTOTAL", "CKMB", "CKMBINDEX", "TROPONINI" in the last 168 hours. BNP (last 3 results) No results for input(s): "PROBNP" in the last 8760 hours. HbA1C: No results for input(s): "HGBA1C" in the last 72 hours. CBG: Recent Labs  Lab 03/02/23 1125 03/02/23 1618 03/02/23 2228 03/03/23 0837 03/03/23 1131  GLUCAP 133* 234* 136* 134* 112*   Lipid Profile: No results for input(s): "CHOL", "HDL", "LDLCALC", "TRIG", "CHOLHDL", "LDLDIRECT" in the last 72 hours. Thyroid Function Tests: No results for input(s): "TSH", "T4TOTAL",  "FREET4", "T3FREE", "THYROIDAB" in the last 72 hours. Anemia Panel: No results for input(s): "VITAMINB12", "FOLATE", "FERRITIN", "TIBC", "IRON", "RETICCTPCT" in the last 72 hours. Sepsis Labs: Recent Labs  Lab 02/28/23 0937  PROCALCITON <0.10    Recent Results (from the past 240 hour(s))  Body fluid culture w Gram Stain     Status: None   Collection Time: 02/28/23  3:40 AM   Specimen: Synovium; Body Fluid  Result Value Ref Range Status   Specimen Description SYNOVIAL FLUID  Final   Special Requests LEFT KNEE  Final   Gram Stain   Final    FEW WBC PRESENT, PREDOMINANTLY PMN NO ORGANISMS SEEN    Culture   Final    NO GROWTH 3 DAYS Performed at [facility], [address], [place], [place] [address]    Report Status 03/03/2023 FINAL  Final  Culture, blood (Routine X 2) w Reflex to ID Panel     Status: None (Preliminary result)   Collection Time: 02/28/23  9:36 AM   Specimen: BLOOD  Result Value Ref Range Status   Specimen Description BLOOD BLOOD RIGHT HAND  Final   Special Requests   Final    BOTTLES DRAWN AEROBIC AND ANAEROBIC Blood Culture adequate volume   Culture   Final    NO GROWTH 3 DAYS Performed at [facility], [address], [place], [place] [address]    Report Status PENDING  Incomplete  Culture, blood (Routine X 2) w Reflex to ID Panel     Status: None (Preliminary result)   Collection Time: 02/28/23 10:47 AM   Specimen: BLOOD  Result Value Ref Range Status   Specimen Description BLOOD BLOOD RIGHT HAND  Final   Special Requests   Final    BOTTLES DRAWN AEROBIC AND ANAEROBIC Blood Culture adequate volume   Culture   Final    NO GROWTH 3 DAYS Performed at [facility], [address], [place], [place] [address]    Report Status PENDING  Incomplete    Radiology Studies: No results found.  Scheduled Meds:  aspirin EC  81 mg Oral Daily   atorvastatin  20 mg Oral QHS   heparin  5,000 Units Subcutaneous Q8H   insulin aspart  0-15 Units  Subcutaneous TID WC   pantoprazole  40 mg Oral Daily   Continuous Infusions:  cefTRIAXone (ROCEPHIN)  IV 2 g (03/03/23 0904)   vancomycin 1,250 mg (03/03/23 1059)     LOS: 3 days    Time spent: 35 mins    Willeen Niece, MD [facility]   If 7PM-7AM, please contact night-coverage

## 2023-03-04 DIAGNOSIS — M25562 Pain in left knee: Secondary | ICD-10-CM | POA: Diagnosis not present

## 2023-03-04 LAB — GLUCOSE, CAPILLARY
Glucose-Capillary: 141 mg/dL — ABNORMAL HIGH (ref 70–99)
Glucose-Capillary: 118 mg/dL — ABNORMAL HIGH (ref 70–99)
Glucose-Capillary: 163 mg/dL — ABNORMAL HIGH (ref 70–99)
Glucose-Capillary: 153 mg/dL — ABNORMAL HIGH (ref 70–99)

## 2023-03-04 MED ORDER — POLYETHYLENE GLYCOL 3350 17 G PO PACK
17.0000 g | PACK | Freq: Every day | ORAL | Status: DC
  Administered 2023-03-04 – 2023-03-07 (×4): 17 g via ORAL
  Filled 2023-03-04 (×4): qty 1

## 2023-03-04 MED ORDER — SENNOSIDES-DOCUSATE SODIUM 8.6-50 MG PO TABS
1.0000 | ORAL_TABLET | Freq: Two times a day (BID) | ORAL | Status: DC
  Administered 2023-03-04 – 2023-03-07 (×7): 1 via ORAL
  Filled 2023-03-04 (×7): qty 1

## 2023-03-04 MED ORDER — GUAIFENESIN-DM 100-10 MG/5ML PO SYRP
5.0000 mL | ORAL_SOLUTION | ORAL | Status: DC | PRN
  Administered 2023-03-04 – 2023-03-07 (×2): 5 mL via ORAL
  Filled 2023-03-04 (×2): qty 10

## 2023-03-04 MED ORDER — CELECOXIB 200 MG PO CAPS: 200.0000 mg | ORAL_CAPSULE | Freq: Every day | ORAL | 0 refills | Status: AC

## 2023-03-04 MED ORDER — OXYCODONE HCL 5 MG PO TABS
5.0000 mg | ORAL_TABLET | ORAL | Status: DC | PRN
  Administered 2023-03-04 – 2023-03-07 (×12): 5 mg via ORAL
  Filled 2023-03-04 (×12): qty 1

## 2023-03-04 NOTE — Progress Notes (Addendum)
Readmitted with Left  knee pain.   Previous admit 02/17/2023 - 02/20/2023, PNA.     03/04/23 1408  TOC Brief Assessment  Insurance and Status Reviewed  Patient has primary care physician Yes  Home environment has been reviewed From home with daughter  Prior level of function: PTA independent with ADL's, owns [employer] , RW,  [facility] No current home services  Social Determinants of Health Reivew SDOH reviewed no interventions necessary  Readmission risk has been reviewed Yes  Transition of care needs no transition of care needs at this time   Pt with without RX med concerns or transportation issues. Pt without TOC needs, will continue monitoring and assist as needs presents. Gae Gallop RN,BSN,CM [phone]

## 2023-03-04 NOTE — Evaluation (Signed)
Physical Therapy Evaluation Patient Details Name: [name] MRN: [id number] DOB: 06-May-1951 Today's Date: 03/04/2023  History of Present Illness  Patient is 72 y.o. female with recent hospitalization from 9/16-9/19/24 with diagnoses of CAP and now presents to ED with complaints of left knee pain that started around 2 days after recent discharge. PT had been seen in Ortho office 9/26 for aspiration of joint, presented to ED that evening with decreased mobility and inability to ambulate. PMH significant for back pain, DM, GERD, HLD, Rt TKA.   Clinical Impression  [name] is 72 y.o. female admitted with above HPI and diagnosis. Patient is currently limited by functional impairments below (see PT problem list). Patient lives with her daughter and grandchildren but is alone during the day and is independent with rollator for mobility at baseline. Currently pt requires Mod-Max assist for bed mobility and sit<>stand transfers from elevated surfaces. Pt required Stedy transfer bed>chair with Mod assist to stand from [place] paddles. Recommend +2 Stedy for transfers with nursing staff. Patient will benefit from continued skilled PT interventions to address impairments and progress independence with mobility. Anticipate pt will progress mobility as pain improves. Acute PT will follow and progress as able.         If plan is discharge home, recommend the following: Two people to help with walking and/or transfers;Two people to help with bathing/dressing/bathroom;Assistance with cooking/housework;Direct supervision/assist for medications management;Assist for transportation;Help with stairs or ramp for entrance   Can travel by private vehicle   No    Equipment Recommendations  (TBD as pt progresses)  Recommendations for Other Services  OT consult    Functional Status Assessment Patient has had a recent decline in their functional status and demonstrates the ability to make significant improvements  in function in a reasonable and predictable amount of time.     Precautions / Restrictions Precautions Precautions: Fall Restrictions Weight Bearing Restrictions: No      Mobility  Bed Mobility Overal bed mobility: Needs Assistance Bed Mobility: Supine to Sit     Supine to sit: Mod assist, HOB elevated, Used rails     General bed mobility comments: Cues for sequencing use of bed rail and mod assist to walk LE's off EOB and pivot hips with use of bed pad.    Transfers Overall transfer level: Needs assistance Equipment used: Rolling walker (2 wheels), Ambulation equipment used Transfers: Sit to/from Stand, Bed to chair/wheelchair/BSC Sit to Stand: Max assist, From elevated surface           General transfer comment: Max assist from significantly elevated EOB to RW, Mod assist to [facility] from elevated EOB with assist to weight shift Rt/Lt to fit/sit on stedy paddles. Transfer bed>chair via [employer]. Transfer via Lift Equipment: Stedy  Ambulation/Gait                  Stairs            Wheelchair Mobility     Tilt Bed    Modified Rankin (Stroke Patients Only)       Balance Overall balance assessment: Needs assistance Sitting-balance support: Feet supported Sitting balance-Leahy Scale: Fair     Standing balance support: During functional activity, Bilateral upper extremity supported, Reliant on assistive device for balance Standing balance-Leahy Scale: Poor                               Pertinent Vitals/Pain Pain Assessment Pain  Assessment: Faces Faces Pain Scale: Hurts even more Pain Location: Lt knee with flexing Pain Descriptors / Indicators: Sore Pain Intervention(s): Limited activity within patient's tolerance, Repositioned, Monitored during session    Home Living Family/patient expects to be discharged to:: Private residence Living Arrangements: Children Available Help at Discharge: Family;Available  PRN/intermittently Type of Home: House Home Access: Stairs to enter Entrance Stairs-Rails: None (holds the doorframe) Entrance Stairs-Number of Steps: 1 Alternate Level Stairs-Number of Steps: stays in new garage apartment. Home Layout: Two level;Able to live on main level with bedroom/bathroom;Full bath on main level Home Equipment: Cane - single point;Rollator (4 wheels);Toilet riser;Shower seat - built in Additional Comments: goes to [employer] and uses [facility] typically. but has started using rollator instead as she carry her items for the pool. has done water aerobic there since 2020.    Prior Function Prior Level of Function : Needs assist             Mobility Comments: uses RW for mobility in home ADLs Comments: pt does her own cooking, ind with dressing/bathing     Extremity/Trunk Assessment   Upper Extremity Assessment Upper Extremity Assessment: Defer to OT evaluation;Overall WFL for tasks assessed    Lower Extremity Assessment Lower Extremity Assessment: RLE deficits/detail;LLE deficits/detail RLE Deficits / Details: grossly 4/5 throughout, pt with some chronic knee pain due to multiple (5) surgeries RLE Sensation: WNL RLE Coordination: WNL LLE Deficits / Details: limited by pain, grossly 3/5 LLE: Unable to fully assess due to pain LLE Sensation: WNL LLE Coordination: WNL    Cervical / Trunk Assessment Cervical / Trunk Assessment: Other exceptions Cervical / Trunk Exceptions: habitus  Communication   Communication Communication: No apparent difficulties  Cognition Arousal: Alert Behavior During Therapy: WFL for tasks assessed/performed Overall Cognitive Status: Within Functional Limits for tasks assessed                                          General Comments      Exercises     Assessment/Plan    PT Assessment Patient needs continued PT services  PT Problem List Decreased strength;Decreased safety awareness;Decreased knowledge of use  of DME;Decreased mobility;Decreased balance;Decreased activity tolerance;Decreased range of motion;Decreased knowledge of precautions;Obesity;Pain       PT Treatment Interventions DME instruction;Gait training;Stair training;Functional mobility training;Therapeutic activities;Therapeutic exercise;Balance training;Neuromuscular re-education;Cognitive remediation;Patient/family education;Wheelchair mobility training;Manual techniques    PT Goals (Current goals can be found in the Care Plan section)  Acute Rehab PT Goals Patient Stated Goal: stop hurting and regain independence with mobility PT Goal Formulation: With patient Time For Goal Achievement: 03/18/23 Potential to Achieve Goals: Good    Frequency Min 1X/week     Co-evaluation               AM-PAC PT "6 Clicks" Mobility  Outcome Measure Help needed turning from your back to your side while in a flat bed without using bedrails?: A Lot Help needed moving from lying on your back to sitting on the side of a flat bed without using bedrails?: A Lot Help needed moving to and from a bed to a chair (including a wheelchair)?: Total Help needed standing up from a chair using your arms (e.g., wheelchair or bedside chair)?: Total Help needed to walk in hospital room?: Total Help needed climbing 3-5 steps with a railing? : Total 6 Click Score: 8    End  of Session Equipment Utilized During Treatment: Gait belt Activity Tolerance: Patient tolerated treatment well Patient left: in chair;with call bell/phone within reach;with chair alarm set Nurse Communication: Mobility status;Need for lift equipment (+2 w/ Stedy) PT Visit Diagnosis: Other abnormalities of gait and mobility (R26.89);Muscle weakness (generalized) (M62.81);Difficulty in walking, not elsewhere classified (R26.2);Pain Pain - Right/Left: Left Pain - part of body: Knee    Time: 5427-0623 PT Time Calculation (min) (ACUTE ONLY): 32 min   Charges:   PT Evaluation $PT  Eval Moderate Complexity: 1 Mod PT Treatments $Therapeutic Activity: 8-22 mins PT General Charges $$ ACUTE PT VISIT: 1 Visit         Wynn Maudlin, DPT Acute Rehabilitation Services Office [phone]  03/04/23 3:29 PM

## 2023-03-04 NOTE — Discharge Summary (Signed)
Physician Discharge Summary  KEIYANA STEHR ZOX:[id number] DOB: May 08, 1951 DOA: 02/27/2023  PCP: Merri Brunette, MD  Admit date: 02/27/2023  Discharge date: 03/04/2023  Admitted From: Home.  Disposition: Home.  Recommendations for Outpatient Follow-up:  Follow up with PCP in 1-2 weeks. Please obtain BMP/CBC in one week. Advised to continue tramadol as needed for pain control. Patient does not have any infection in the synovial fluid. Follow-up with orthopedics as scheduled.  Home Health:None Equipment/Devices:None  Discharge Condition: Stable CODE STATUS:Full code Diet recommendation: Heart Healthy  Brief [facility] Course: This 72 yrs old female with PMH significant for diabetes type 2, hyperlipidemia  with recent hospitalization from 9/16-9/19/24 with diagnoses of CAP and now presents to ED with complaints of left knee pain that started around 2 days after recent discharge.  Patient has followed up with [facility] on 9/26 at which time she had joint aspiration. Patient later that evening presented to the ED due to intense pain with decreased mobility of the left knee and inability to ambulate due to pain. Workup in the ED reveals elevated CRP, ESR, Patient was admitted for further evaluation.  Started on empiric antibiotics.  Patient has tachycardia.  D-dimer was slightly elevated.  CTA chest ruled out pulmonary embolism.  Orthopedics signed off .  Arthrocentesis shows urate crystals.  No signs of any infection.  Antibiotics discontinued.  Patient feels better and  wants to be discharged.  Patient being discharged home on Celebrex, tramadol as needed for pain control.  Advised to follow-up with PCP for treatment for gout.  Discharge Diagnoses:  Principal Problem:   Left anterior knee pain  Left knee swelling / pain,  rule out septic joint: Patient presented with left knee pain 2 days following discharge from hospital for pneumonia. Patient underwent arthrocentesis in  [facility] clinic. Arthrocentesis shows urate crystals, no infection. Elevated CRP and ESR. Continue to trend inflammatory markers Initiated vancomycin and ceftriaxone which were subsequently discontinued. Ortho consulted, no plans for any surgical intervention unless aspirate shows signs concerning for infection.  Uric acid normal. Continue adequate pain control. Ortho signed off,  recommended outpatient follow-up with PCP.   Type 2 diabetes: Hold p.o. diabetic medications. Continue regular insulin sliding scale. Carb modified diet.   Hyperlipidemia : Continue Lipitor   Hx of recent CAP: Patient has completed antibiotics course for pneumonia. No current respiratory symptoms, now resolved .   Tachycardia: Patient continued to remain tachycardic and tachypneic. D-dimer slightly elevated, CTA ruled out pulmonary embolism. Heart rate improved.   Discharge Instructions  Discharge Instructions     Call MD for:  difficulty breathing, headache or visual disturbances   Complete by: As directed    Call MD for:  persistant dizziness or light-headedness   Complete by: As directed    Call MD for:  persistant nausea and vomiting   Complete by: As directed    Diet - low sodium heart healthy   Complete by: As directed    Diet Carb Modified   Complete by: As directed    Discharge instructions   Complete by: As directed    Advised to follow-up with primary care physician in 1 week. Advised to continue tramadol as needed for pain control. Patient does not have any infection in the synovial fluid. Follow-up with orthopedics as scheduled.   Increase activity slowly   Complete by: As directed       Allergies as of 03/04/2023       Reactions   Codeine Itching   Morphine  Itching   Sulindac Itching   Burnt spots        Medication List     STOP taking these medications    BAYER BACK & BODY PAIN EX ST PO       TAKE these medications    Accu-Chek FastClix Lancets  Misc   alendronate 70 MG tablet Commonly known as: FOSAMAX Take 70 mg by mouth once a week. Monday   aspirin EC 81 MG tablet Take 81 mg by mouth daily.   atorvastatin 20 MG tablet Commonly known as: LIPITOR TAKE ONE TABLET BY MOUTH AT BEDTIME   CALCIUM 600/VITAMIN D3 PO Take 1 tablet by mouth daily.   celecoxib 200 MG capsule Commonly known as: CeleBREX Take 1 capsule (200 mg total) by mouth daily.   cholecalciferol 25 MCG (1000 UNIT) tablet Commonly known as: VITAMIN D3 Take 1,000 Units by mouth daily.   COMPLETE MULTIVITAMIN/MINERAL PO Take 1 tablet by mouth daily.   FISH OIL PO Take 1 capsule by mouth in the morning and at bedtime.   furosemide 40 MG tablet Commonly known as: LASIX Take 40 mg by mouth See admin instructions. Take 1 tablet by mouth 2 to 3 times a week   metFORMIN 500 MG tablet Commonly known as: GLUCOPHAGE Take 1 tablet (500 mg total) by mouth 2 (two) times daily with a meal. What changed: how much to take   methocarbamol 750 MG tablet Commonly known as: Robaxin-750 Take 1 tablet (750 mg total) by mouth every 6 (six) hours as needed for muscle spasms (back pain).   Ozempic (0.25 or 0.5 MG/DOSE) 2 MG/1.5ML Sopn Generic drug: Semaglutide(0.25 or 0.5MG /DOS) Inject 0.5 mg into the skin once a week. Sunday   pantoprazole 40 MG tablet Commonly known as: PROTONIX Take 1 tablet (40 mg total) by mouth daily.   traMADol 50 MG tablet Commonly known as: ULTRAM Take 50 mg by mouth 3 (three) times daily as needed.        Follow-up Information     Merri Brunette, MD Follow up.   Specialty: Family Medicine Contact information: [phone] [address] [place] [address] [phone]         [facility] in 1 week(s).   Specialty: Specialist Contact information: [address] [place] [address] [phone]                Allergies  Allergen Reactions   Codeine Itching   Morphine Itching    Sulindac Itching    Burnt spots    Consultations: [facility]   Procedures/Studies: CT Angio Chest Pulmonary Embolism (PE) W or WO Contrast  Result Date: 03/03/2023 CLINICAL DATA:  Pulmonary embolism suspected. High probability for pulmonary embolism EXAM: CT ANGIOGRAPHY CHEST WITH CONTRAST TECHNIQUE: Multidetector CT imaging of the chest was performed using the standard protocol during bolus administration of intravenous contrast. Multiplanar CT image reconstructions and MIPs were obtained to evaluate the vascular anatomy. RADIATION DOSE REDUCTION: This exam was performed according to the departmental dose-optimization program which includes automated exposure control, adjustment of the mA and/or kV according to patient size and/or use of iterative reconstruction technique. CONTRAST:  75mL OMNIPAQUE IOHEXOL 350 MG/ML SOLN COMPARISON:  02/18/2023 FINDINGS: Cardiovascular: Suboptimal but satisfactory opacification of the pulmonary arteries to the segmental level. No evidence of pulmonary embolism. Normal heart size. No pericardial effusion. Mediastinum/Nodes: Negative for mass or adenopathy. Lungs/Pleura: Generalized airway thickening with intermittent airway collapse. Bands of ground-glass density with small pleural effusion greater  on the right. Aeration has improved, especially in the subpleural upper lobes. No pneumothorax Upper Abdomen: Unremarkable Musculoskeletal: No acute or aggressive finding Review of the MIP images confirms the above findings. IMPRESSION: 1. Negative for pulmonary embolism. 2. Diffuse bronchitic airway thickening with mild atelectasis and small pleural effusions. Aeration has improved from 02/18/2023. Electronically Signed   By: Tiburcio Pea M.D.   On: 03/03/2023 12:30   DG Knee Complete 4 Views Left  Result Date: 02/24/2023 CLINICAL DATA:  Left knee pain and stiffness, no reported injury EXAM: LEFT KNEE - COMPLETE 4+ VIEW COMPARISON:  10/15/2012 left knee radiographs  FINDINGS: Small suprapatellar left knee joint effusion. No fracture or dislocation. 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 No suspicious focal osseous lesions. IMPRESSION: 1. Small suprapatellar left knee joint effusion. No fracture or dislocation. 2. 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 Electronically Signed   By: Delbert Phenix M.D.   On: 02/24/2023 12:21   CT ABDOMEN PELVIS WO CONTRAST  Result Date: 02/18/2023 CLINICAL DATA:  Acute non localized abdominal pain. EXAM: CT ABDOMEN AND PELVIS WITHOUT CONTRAST TECHNIQUE: Multidetector CT imaging of the abdomen and pelvis was performed following the standard protocol without IV contrast. RADIATION DOSE REDUCTION: This exam was performed according to the departmental dose-optimization program which includes automated exposure control, adjustment of the mA and/or kV according to patient size and/or use of iterative reconstruction technique. COMPARISON:  None Available. FINDINGS: Lower chest: Small right pleural effusion. Hepatobiliary: No suspicious mass visualized on this unenhanced exam. Probable sub-centimeter cyst in anterior right hepatic lobe. Prior cholecystectomy. No evidence of biliary obstruction. Pancreas: No mass or inflammatory process visualized on this unenhanced exam. Spleen:  Within normal limits in size. 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 No evidence of urolithiasis or hydronephrosis. Small amount of contrast in renal collecting systems and contrast filling the urinary bladder, attributable to recent chest CTA. Stomach/Bowel: No evidence of obstruction, inflammatory process, or abnormal fluid collections. Normal appendix visualized. Vascular/Lymphatic: No  pathologically enlarged lymph nodes identified. No evidence of abdominal aortic aneurysm. Reproductive:  No mass or other significant abnormality. Other: Small midline epigastric and umbilical hernias are seen, which contain only fat. Musculoskeletal:  No suspicious bone lesions identified. IMPRESSION: No acute findings within the abdomen or pelvis. Small midline epigastric and umbilical abdominal wall hernias, which contain only fat. Small right pleural effusion. Electronically Signed   By: Danae Orleans M.D.   On: 02/18/2023 14:01   CT Angio Chest PE W and/or Wo Contrast  Result Date: 02/18/2023 CLINICAL DATA:  Back pain worsening over the last month. Worsening with coughing or breathing. Increased confusion and lethargy. EXAM: CT ANGIOGRAPHY CHEST WITH CONTRAST TECHNIQUE: Multidetector CT imaging of the chest was performed using the standard protocol during bolus administration of intravenous contrast. Multiplanar CT image reconstructions and MIPs were obtained to evaluate the vascular anatomy. RADIATION DOSE REDUCTION: This exam was performed according to the departmental dose-optimization program which includes automated exposure control, adjustment of the mA and/or kV according to patient size and/or use of iterative reconstruction technique. CONTRAST:  75mL OMNIPAQUE IOHEXOL 350 MG/ML SOLN COMPARISON:  Radiograph 02/17/2023 FINDINGS: Cardiovascular: Respiratory motion obscures the segmental and subsegmental arteries. No central pulmonary embolism. No pericardial effusion. Normal caliber thoracic aorta without dissection. Coronary artery and aortic atherosclerotic calcification. Mediastinum/Nodes: Bowing of posterior trachea compatible  with aspiration. The esophagus is unremarkable. 1.1 cm right pretracheal node on 6/36. Lungs/Pleura: Patchy bilateral consolidative opacities with associated interlobular septal thickening greatest in the upper lobes. Small right pleural effusion. No pneumothorax. Upper  Abdomen: 2 low-density lesions in the liver are indeterminate but statistically likely to represent benign cysts or hemangiomas. No follow-up is recommended if patient is low risk for [facility] and has no known malignancy. No acute abnormality. Musculoskeletal: No acute fracture. Review of the MIP images confirms the above findings. IMPRESSION: 1. Respiratory motion obscures the segmental and subsegmental arteries. No central pulmonary embolism. 2. Pulmonary findings compatible with multifocal pneumonia. Follow-up in 6-8 weeks after treatment is recommended to ensure resolution. 3. Small right pleural effusion. 4. Enlarged pretracheal node is likely reactive. Continued attention on follow-up. Aortic Atherosclerosis (ICD10-I70.0). Electronically Signed   By: Minerva Fester M.D.   On: 02/18/2023 02:56   CT Head Wo Contrast  Result Date: 02/18/2023 CLINICAL DATA:  Mental status change EXAM: CT HEAD WITHOUT CONTRAST TECHNIQUE: Contiguous axial images were obtained from the base of the skull through the vertex without intravenous contrast. RADIATION DOSE REDUCTION: This exam was performed according to the departmental dose-optimization program which includes automated exposure control, adjustment of the mA and/or kV according to patient size and/or use of iterative reconstruction technique. COMPARISON:  None Available. FINDINGS: Brain: No intracranial hemorrhage, mass effect, or evidence of acute infarct. No hydrocephalus. No extra-axial fluid collection. Vascular: No hyperdense vessel or unexpected calcification. Skull: No fracture or focal lesion. Sinuses/Orbits: No acute finding. Mucosal thickening about the left maxillary sinus ethmoid air cells. No mastoid effusion. Other: None. IMPRESSION: No acute intracranial abnormality. Electronically Signed   By: Minerva Fester M.D.   On: 02/18/2023 02:44   DG Chest 2 View  Result Date: 02/17/2023 CLINICAL DATA:  Chest pain, right-sided rib pain EXAM: CHEST - 2 VIEW  COMPARISON:  09/27/2016 FINDINGS: Single frontal view of the chest demonstrates an unremarkable cardiac silhouette. There is patchy airspace disease within the right upper and right lower lobe, consistent with multifocal bronchopneumonia. Small right parapneumonic effusion. No pneumothorax. No acute bony abnormalities. IMPRESSION: 1. Multifocal right-sided bronchopneumonia and small parapneumonic effusion. Electronically Signed   By: Sharlet Salina M.D.   On: 02/17/2023 22:12     Subjective: Patient was seen and examined at bedside.  Overnight events noted.   Patient reports doing much better,  still reports having left knee pain which is improving.  Discharge Exam: Vitals:   03/04/23 0429 03/04/23 0736  BP: (!) 151/91 139/71  Pulse: 96 (!) 104  Resp: 17   Temp: 98.9 F (37.2 C) 99.1 F (37.3 C)  SpO2: 96% 94%   Vitals:   03/03/23 1334 03/03/23 2109 03/04/23 0429 03/04/23 0736  BP: 136/71 133/76 (!) 151/91 139/71  Pulse: 98 (!) 101 96 (!) 104  Resp: 17 16 17    Temp: 99 F (37.2 C) 98.8 F (37.1 C) 98.9 F (37.2 C) 99.1 F (37.3 C)  TempSrc: Oral Oral Oral Oral  SpO2: 98% 94% 96% 94%  Weight:      Height:        General: Pt is alert, awake, not in acute distress Cardiovascular: RRR, S1/S2 +, no rubs, no gallops Respiratory: CTA bilaterally, no wheezing, no rhonchi Abdominal: Soft, NT, ND, bowel sounds + Extremities: no edema, no cyanosis, Left knee tenderness.    The results of significant diagnostics from this hospitalization (including imaging, microbiology, ancillary and laboratory) are listed below for reference.  Microbiology: Recent Results (from the past 240 hour(s))  Body fluid culture w Gram Stain     Status: None   Collection Time: 02/28/23  3:40 AM   Specimen: Synovium; Body Fluid  Result Value Ref Range Status   Specimen Description SYNOVIAL FLUID  Final   Special Requests LEFT KNEE  Final   Gram Stain   Final    FEW WBC PRESENT, PREDOMINANTLY  PMN NO ORGANISMS SEEN    Culture   Final    NO GROWTH 3 DAYS Performed at [facility], [address], [place], [place] [address]    Report Status 03/03/2023 FINAL  Final  Culture, blood (Routine X 2) w Reflex to ID Panel     Status: None (Preliminary result)   Collection Time: 02/28/23  9:36 AM   Specimen: BLOOD  Result Value Ref Range Status   Specimen Description BLOOD BLOOD RIGHT HAND  Final   Special Requests   Final    BOTTLES DRAWN AEROBIC AND ANAEROBIC Blood Culture adequate volume   Culture   Final    NO GROWTH 4 DAYS Performed at [facility], [address], [place], [place] [address]    Report Status PENDING  Incomplete  Culture, blood (Routine X 2) w Reflex to ID Panel     Status: None (Preliminary result)   Collection Time: 02/28/23 10:47 AM   Specimen: BLOOD  Result Value Ref Range Status   Specimen Description BLOOD BLOOD RIGHT HAND  Final   Special Requests   Final    BOTTLES DRAWN AEROBIC AND ANAEROBIC Blood Culture adequate volume   Culture   Final    NO GROWTH 4 DAYS Performed at [facility], [address] [place], [place] [address]    Report Status PENDING  Incomplete     Labs: BNP (last 3 results) No results for input(s): "BNP" in the last 8760 hours. Basic Metabolic Panel: Recent Labs  Lab 02/28/23 0034 02/28/23 0937 03/01/23 0440  NA 138  --  135  K 3.7  --  3.6  CL 104  --  102  CO2 23  --  23  GLUCOSE 134*  --  119*  BUN 14  --  11  CREATININE 0.89 0.83 0.80  CALCIUM 9.2  --  8.7*   Liver Function Tests: Recent Labs  Lab 03/01/23 0440  AST 17  ALT 15  ALKPHOS 83  BILITOT 0.3  PROT 6.4*  ALBUMIN 2.1*   No results for input(s): "LIPASE", "AMYLASE" in the last 168 hours. No results for input(s): "AMMONIA" in the last 168 hours. CBC: Recent Labs  Lab 02/28/23 0034 02/28/23 0937 03/01/23 0440  WBC 10.6* 10.2 10.6*  NEUTROABS 7.6  --   --   HGB 11.1* 10.4* 10.1*  HCT 37.6 35.0* 33.9*  MCV 85.8 86.4  84.5  PLT 499* 484* 444*   Cardiac Enzymes: No results for input(s): "CKTOTAL", "CKMB", "CKMBINDEX", "TROPONINI" in the last 168 hours. BNP: Invalid input(s): "POCBNP" CBG: Recent Labs  Lab 03/03/23 1131 03/03/23 1612 03/03/23 2100 03/04/23 0738 03/04/23 1134  GLUCAP 112* 143* 135* 141* 118*   D-Dimer Recent Labs    03/02/23 0604  DDIMER 1.69*   Hgb A1c No results for input(s): "HGBA1C" in the last 72 hours. Lipid Profile No results for input(s): "CHOL", "HDL", "LDLCALC", "TRIG", "CHOLHDL", "LDLDIRECT" in the last 72 hours. Thyroid function studies No results for input(s): "TSH", "T4TOTAL", "T3FREE", "THYROIDAB" in the last 72 hours.  Invalid  input(s): "FREET3" Anemia work up No results for input(s): "VITAMINB12", "FOLATE", "FERRITIN", "TIBC", "IRON", "RETICCTPCT" in the last 72 hours. Urinalysis    Component Value Date/Time   COLORURINE YELLOW 02/18/2023 0348   APPEARANCEUR CLEAR 02/18/2023 0348   LABSPEC 1.030 02/18/2023 0348   PHURINE 5.0 02/18/2023 0348   GLUCOSEU NEGATIVE 02/18/2023 0348   HGBUR NEGATIVE 02/18/2023 0348   BILIRUBINUR NEGATIVE 02/18/2023 0348   KETONESUR 5 (A) 02/18/2023 0348   PROTEINUR NEGATIVE 02/18/2023 0348   UROBILINOGEN 0.2 12/14/2010 0930   NITRITE NEGATIVE 02/18/2023 0348   LEUKOCYTESUR NEGATIVE 02/18/2023 0348   Sepsis Labs Recent Labs  Lab 02/28/23 0034 02/28/23 0937 03/01/23 0440  WBC 10.6* 10.2 10.6*   Microbiology Recent Results (from the past 240 hour(s))  Body fluid culture w Gram Stain     Status: None   Collection Time: 02/28/23  3:40 AM   Specimen: Synovium; Body Fluid  Result Value Ref Range Status   Specimen Description SYNOVIAL FLUID  Final   Special Requests LEFT KNEE  Final   Gram Stain   Final    FEW WBC PRESENT, PREDOMINANTLY PMN NO ORGANISMS SEEN    Culture   Final    NO GROWTH 3 DAYS Performed at [facility], [address], [place], [place] [address]    Report Status 03/03/2023 FINAL   Final  Culture, blood (Routine X 2) w Reflex to ID Panel     Status: None (Preliminary result)   Collection Time: 02/28/23  9:36 AM   Specimen: BLOOD  Result Value Ref Range Status   Specimen Description BLOOD BLOOD RIGHT HAND  Final   Special Requests   Final    BOTTLES DRAWN AEROBIC AND ANAEROBIC Blood Culture adequate volume   Culture   Final    NO GROWTH 4 DAYS Performed at [facility], [address], [place], [place] [address]    Report Status PENDING  Incomplete  Culture, blood (Routine X 2) w Reflex to ID Panel     Status: None (Preliminary result)   Collection Time: 02/28/23 10:47 AM   Specimen: BLOOD  Result Value Ref Range Status   Specimen Description BLOOD BLOOD RIGHT HAND  Final   Special Requests   Final    BOTTLES DRAWN AEROBIC AND ANAEROBIC Blood Culture adequate volume   Culture   Final    NO GROWTH 4 DAYS Performed at [facility], [address] [place], [place] [address]    Report Status PENDING  Incomplete     Time coordinating discharge: Over 30 minutes  SIGNED:   Willeen Niece, MD  [facility] 03/04/2023, 2:40 PM Pager   If 7PM-7AM, please contact night-coverage

## 2023-03-04 NOTE — Progress Notes (Signed)
Physical Therapy Treatment Patient Details Name: [name] MRN: [id number] DOB: 04/25/1951 Today's Date: 03/04/2023   History of Present Illness Patient is 72 y.o. female with recent hospitalization from 9/16-9/19/24 with diagnoses of CAP and now presents to ED with complaints of left knee pain that started around 2 days after recent discharge. PT had been seen in Ortho office 9/26 for aspiration of joint, presented to ED that evening with decreased mobility and inability to ambulate. PMH significant for back pain, DM, GERD, HLD, Rt TKA.    PT Comments  Pt assisted to bathroom with stedy by request of nursing staff. Pt required +2 Mod A from chair using stedy however Max A from toilet. No change in DC/DME recs at this time. PT will continue to follow.    If plan is discharge home, recommend the following: Two people to help with walking and/or transfers;Two people to help with bathing/dressing/bathroom;Assistance with cooking/housework;Direct supervision/assist for medications management;Assist for transportation;Help with stairs or ramp for entrance   Can travel by private vehicle     No  Equipment Recommendations   (TBD as pt progresses)    Recommendations for Other Services OT consult     Precautions / Restrictions Precautions Precautions: Fall Restrictions Weight Bearing Restrictions: No     Mobility  Bed Mobility Overal bed mobility: Needs Assistance Bed Mobility: Sit to Supine       Sit to supine: +2 for physical assistance, Max assist   General bed mobility comments: Helicopter method    Transfers Overall transfer level: Needs assistance Equipment used: Ambulation equipment used Transfers: Sit to/from Stand, Bed to chair/wheelchair/BSC Sit to Stand: Max assist, From elevated surface, +2 physical assistance, Mod assist           General transfer comment: +2 Mod A from chair. Max A from chair in stedy. Transfer via Lift Equipment: Stedy  Ambulation/Gait                    Stairs             Wheelchair Mobility     Tilt Bed    Modified Rankin (Stroke Patients Only)       Balance Overall balance assessment: Needs assistance Sitting-balance support: Feet supported Sitting balance-Leahy Scale: Fair     Standing balance support: During functional activity, Bilateral upper extremity supported, Reliant on assistive device for balance Standing balance-Leahy Scale: Poor                              Cognition Arousal: Alert Behavior During Therapy: WFL for tasks assessed/performed Overall Cognitive Status: Within Functional Limits for tasks assessed                                          Exercises      General Comments        Pertinent Vitals/Pain Pain Assessment Pain Assessment: Faces Faces Pain Scale: Hurts even more Pain Location: Lt knee with flexing Pain Descriptors / Indicators: Sore Pain Intervention(s): Limited activity within patient's tolerance, Monitored during session    Home Living Family/patient expects to be discharged to:: Private residence Living Arrangements: Children Available Help at Discharge: Family;Available PRN/intermittently Type of Home: House Home Access: Stairs to enter Entrance Stairs-Rails: None (holds the doorframe) Entrance Stairs-Number of Steps: 1 Alternate Level Stairs-Number of Steps: stays  in new garage apartment. Home Layout: Two level;Able to live on main level with bedroom/bathroom;Full bath on main level Home Equipment: Cane - single point;Rollator (4 wheels);Toilet riser;Shower seat - built in Additional Comments: goes to [employer] and uses [facility] typically. but has started using rollator instead as she carry her items for the pool. has done water aerobic there since 2020.    Prior Function            PT Goals (current goals can now be found in the care plan section) Acute Rehab PT Goals Patient Stated Goal: stop hurting and regain  independence with mobility PT Goal Formulation: With patient Time For Goal Achievement: 03/18/23 Potential to Achieve Goals: Good    Frequency    Min 1X/week      PT Plan      Co-evaluation              AM-PAC PT "6 Clicks" Mobility   Outcome Measure  Help needed turning from your back to your side while in a flat bed without using bedrails?: A Lot Help needed moving from lying on your back to sitting on the side of a flat bed without using bedrails?: A Lot Help needed moving to and from a bed to a chair (including a wheelchair)?: Total Help needed standing up from a chair using your arms (e.g., wheelchair or bedside chair)?: Total Help needed to walk in hospital room?: Total Help needed climbing 3-5 steps with a railing? : Total 6 Click Score: 8    End of Session Equipment Utilized During Treatment: Gait belt Activity Tolerance: Patient tolerated treatment well Patient left: in chair;with call bell/phone within reach;with chair alarm set Nurse Communication: Mobility status;Need for lift equipment (+2 w/ Stedy) PT Visit Diagnosis: Other abnormalities of gait and mobility (R26.89);Muscle weakness (generalized) (M62.81);Difficulty in walking, not elsewhere classified (R26.2);Pain Pain - Right/Left: Left Pain - part of body: Knee     Time: 1520-1545 PT Time Calculation (min) (ACUTE ONLY): 25 min  Charges:    $Therapeutic Activity: 23-37 mins PT General Charges $$ ACUTE PT VISIT: 1 Visit                     Shela Nevin, PT, DPT Acute Rehab Services [phone]    Gladys Damme 03/04/2023, 3:53 PM

## 2023-03-04 NOTE — Progress Notes (Signed)
SLP Cancellation Note  Patient Details Name: [name] MRN: [id number] DOB: 09-Dec-1950   Cancelled treatment:       Reason Eval/Treat Not Completed: SLP screened, no needs identified, will sign off. Discussed with MD, RN pt and daughter.    Zeeshan Korte, Riley Nearing 03/04/2023, 11:16 AM

## 2023-03-04 NOTE — Plan of Care (Signed)
  Problem: Clinical Measurements: Goal: Will remain free from infection Outcome: Progressing Goal: Respiratory complications will improve Outcome: Progressing Goal: Cardiovascular complication will be avoided Outcome: Progressing   Problem: Nutrition: Goal: Adequate nutrition will be maintained Outcome: Progressing   

## 2023-03-04 NOTE — Discharge Instructions (Signed)
Advised to continue tramadol as needed for pain control. Patient does not have any infection in the synovial fluid. Follow-up with orthopedics as scheduled.

## 2023-03-05 DIAGNOSIS — M25562 Pain in left knee: Secondary | ICD-10-CM | POA: Diagnosis not present

## 2023-03-05 LAB — CULTURE, BLOOD (ROUTINE X 2)
Culture: NO GROWTH
Culture: NO GROWTH
Special Requests: ADEQUATE
Special Requests: ADEQUATE

## 2023-03-05 LAB — GLUCOSE, CAPILLARY
Glucose-Capillary: 136 mg/dL — ABNORMAL HIGH (ref 70–99)
Glucose-Capillary: 110 mg/dL — ABNORMAL HIGH (ref 70–99)
Glucose-Capillary: 172 mg/dL — ABNORMAL HIGH (ref 70–99)
Glucose-Capillary: 111 mg/dL — ABNORMAL HIGH (ref 70–99)

## 2023-03-05 MED ORDER — IBUPROFEN 200 MG PO TABS: 400.0000 mg | ORAL_TABLET | Freq: Four times a day (QID) | ORAL | Status: DC | PRN

## 2023-03-05 NOTE — NC FL2 (Signed)
MEDICAID FL2 LEVEL OF CARE FORM     IDENTIFICATION  Patient Name: [name] Birthdate: 1951-02-03 Sex: female Admission Date (Current Location): 02/27/2023  [facility] and [place] Number:  Producer, television/film/video and Address:  [facility], [address] [place], [place] [address]      Provider Number: [id number]  Attending Physician Name and Address:  Burnadette Pop, MD  Relative Name and Phone Number:  Derden,Catina Daughter [phone]    Current Level of Care: Hospital Recommended Level of Care: Skilled Nursing Facility Prior Approval Number:    Date Approved/Denied:   PASRR Number:    Discharge Plan: SNF    Current Diagnoses: Patient Active Problem List   Diagnosis Date Noted   Left anterior knee pain 02/28/2023   Pneumonia 02/18/2023   Conjunctival hemorrhage of left eye 02/18/2023   Elevated BP without diagnosis of hypertension 02/18/2023   Abdominal pain 02/18/2023   Stress 06/11/2022   Osteoarthritis of both knees 04/30/2022   Other hyperlipidemia 04/03/2022   Type 2 diabetes mellitus with other specified complication (HCC) 02/28/2022   Other fatigue 01/15/2022   SOB (shortness of breath) on exertion 01/15/2022   Type 2 diabetes mellitus with obesity (HCC) 01/15/2022   Hyperlipidemia associated with type 2 diabetes mellitus (HCC) 01/15/2022   Vitamin D deficiency 01/15/2022   Health care maintenance 01/15/2022   Depression screening 01/15/2022   Class 3 severe obesity with serious comorbidity and body mass index (BMI) of 45.0 to 49.9 in adult [facility]) 01/15/2022   Arthritis 10/11/2014   CKD stage 2 due to type 2 diabetes mellitus (HCC) 10/11/2014   Controlled diabetes mellitus type II without complication (HCC) 10/11/2014   Headache 10/11/2014   Personal history of methicillin resistant Staphylococcus aureus 10/11/2014   BP (high blood pressure) 10/11/2014   Extreme obesity 10/11/2014   HLD (hyperlipidemia) 10/11/2014    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 10/11/2014   Ear pain, right 06/09/2012   Numbness and tingling in right hand 05/04/2012   Status post total knee replacement 11/26/2010   History of osteopenia 11/26/2010   Controlled type 2 diabetes mellitus without complication, without long-term current use of insulin (HCC) 12/01/2009   SKIN RASH 05/03/2009   Osteoarthritis 09/23/2006   Hyperlipidemia 07/31/2006   OBESITY, NOS 07/31/2006   GASTROESOPHAGEAL REFLUX, NO ESOPHAGITIS 07/31/2006   Back pain 07/31/2006    Orientation RESPIRATION BLADDER Height & Weight     Self, Time, Situation, Place  Normal Incontinent Weight: 240 lb (108.9 kg) Height:  5\' 3"  (160 cm)  BEHAVIORAL SYMPTOMS/MOOD NEUROLOGICAL BOWEL NUTRITION STATUS      Continent Diet (see discharge summary)  AMBULATORY STATUS COMMUNICATION OF NEEDS Skin   Total Care Verbally Normal                       Personal Care Assistance Level of Assistance  Bathing, Feeding, Dressing Bathing Assistance: Maximum assistance Feeding assistance: Independent Dressing Assistance: Maximum assistance     Functional Limitations Info  Sight, Hearing, Speech Sight Info: Adequate Hearing Info: Adequate Speech Info: Adequate    SPECIAL CARE FACTORS FREQUENCY  PT (By licensed PT), OT (By licensed OT)     PT Frequency: 5x week OT Frequency: 5x week            Contractures Contractures Info: Not present    Additional Factors Info  Code Status, Allergies, Insulin Sliding Scale Code Status Info: full Allergies Info: Codeine, Morphine, Sulindac   Insulin  Sliding Scale Info: Novolog: see discharge summary       Current Medications (03/05/2023):  This is the current hospital active medication list Current Facility-Administered Medications  Medication Dose Route Frequency Provider Last Rate Last Admin   acetaminophen (TYLENOL) tablet 650 mg  650 mg Oral Q6H PRN Lurline Del, MD   650 mg at 03/05/23 0503   Or   acetaminophen  (TYLENOL) suppository 650 mg  650 mg Rectal Q6H PRN Lurline Del, MD       albuterol (PROVENTIL) (2.5 MG/3ML) 0.083% nebulizer solution 2.5 mg  2.5 mg Nebulization Q2H PRN Lurline Del, MD       aspirin EC tablet 81 mg  81 mg Oral Daily Skip Mayer A, MD   81 mg at 03/05/23 0851   atorvastatin (LIPITOR) tablet 20 mg  20 mg Oral QHS Skip Mayer A, MD   20 mg at 03/04/23 2135   guaiFENesin-dextromethorphan (ROBITUSSIN DM) 100-10 MG/5ML syrup 5 mL  5 mL Oral Q4H PRN Willeen Niece, MD   5 mL at 03/04/23 0848   heparin injection 5,000 Units  5,000 Units Subcutaneous Q8H Skip Mayer A, MD   5,000 Units at 03/05/23 1330   HYDROmorphone (DILAUDID) injection 0.5 mg  0.5 mg Intravenous Q2H PRN Skip Mayer A, MD   0.5 mg at 03/04/23 1601   ibuprofen (ADVIL) tablet 400 mg  400 mg Oral Q6H PRN Burnadette Pop, MD       insulin aspart (novoLOG) injection 0-15 Units  0-15 Units Subcutaneous TID WC Skip Mayer A, MD   2 Units at 03/05/23 0853   ondansetron (ZOFRAN) tablet 4 mg  4 mg Oral Q6H PRN Lurline Del, MD       Or   ondansetron [facility]) injection 4 mg  4 mg Intravenous Q6H PRN Lurline Del, MD       Oral care mouth rinse  15 mL Mouth Rinse PRN Skip Mayer A, MD       oxyCODONE (Oxy IR/ROXICODONE) immediate release tablet 5 mg  5 mg Oral Q4H PRN Willeen Niece, MD   5 mg at 03/05/23 1322   pantoprazole (PROTONIX) EC tablet 40 mg  40 mg Oral Daily Skip Mayer A, MD   40 mg at 03/05/23 0851   polyethylene glycol (MIRALAX / GLYCOLAX) packet 17 g  17 g Oral Daily Willeen Niece, MD   17 g at 03/05/23 0454   senna-docusate (Senokot-S) tablet 1 tablet  1 tablet Oral BID Willeen Niece, MD   1 tablet at 03/05/23 0981     Discharge Medications: Please see discharge summary for a list of discharge medications.  Relevant Imaging Results:  Relevant Lab Results:   Additional Information SSN: [id number]  Lorri Frederick,  LCSW

## 2023-03-05 NOTE — Progress Notes (Signed)
PROGRESS NOTE  [name]  YNW:[id number] DOB: Dec 16, 1950 DOA: 02/27/2023 PCP: Merri Brunette, MD   Brief Narrative: Patient is a 48 female with history of diabetes type 2, hyperlipidemia, recent history of community-acquired pneumonia who presented with left knee pain.  She recently had a joint aspiration on the right knee by [facility] on 9/26.  Workup on admission showed elevated CRP, ESR.  Labs showed elevated D-dimer.  Arthrocentesis showed no crystals, no signs of infection, antibiotics discontinued.  PT/OT recommending SNF on discharge.  Assessment & Plan:  Principal Problem:   Left anterior knee pain  Left knee swelling/pain: Presented with tachycardia, elevated CRP/ESR.  Left knee aspiration done as an outpatient and also in ED.  Per orthopedics note, it showed monosodium urate crystals but as per arthrocentesis fluid analysis done in the ED on the same day, epic report  shows no crystals.  Uric acid level normal.  Continue pain management, supportive care.  Continue NSAIDs on discharge.  PT/recommending SNF on discharge.  She needs to follow-up with orthopedics as an outpatient Antibiotics have been discontinued which were initially started for suspicion of septic arthritis.  Type of diabetes: Currently on sliding scale.  Monitor blood sugars.  Continue home regimen on discharge  Hyperlipidemia: Continue Lipitor  Recent H/O  Community-acquired pneumonia: Completed antibiotics course, currently no respiratory symptoms  Tachycardia/elevated D-dimer: Currently stable.  CT ruled out PE.         DVT prophylaxis:heparin injection 5,000 Units Start: 02/28/23 1400     Code Status: Full Code  Family Communication: Daughter at bedside  Patient status:Inpatient  Patient is from :Home  Anticipated discharge MV:HQIONGEX possible  Estimated DC date:whenever possible to SnF   Consultants: Orthopedics  Procedures:None yet  Antimicrobials:  Anti-infectives (From  admission, onward)    Start     Dose/Rate Route Frequency Ordered Stop   03/01/23 1000  vancomycin (VANCOREADY) IVPB 1250 mg/250 mL  Status:  Discontinued        1,250 mg 166.7 mL/hr over 90 Minutes Intravenous Every 24 hours 02/28/23 0909 03/04/23 1620   02/28/23 0930  cefTRIAXone (ROCEPHIN) 1 g in sodium chloride 0.9 % 100 mL IVPB  Status:  Discontinued        1 g 200 mL/hr over 30 Minutes Intravenous Every 24 hours 02/28/23 0900 02/28/23 0905   02/28/23 0930  cefTRIAXone (ROCEPHIN) 2 g in sodium chloride 0.9 % 100 mL IVPB  Status:  Discontinued        2 g 200 mL/hr over 30 Minutes Intravenous Every 24 hours 02/28/23 0905 03/04/23 1620   02/28/23 0915  vancomycin (VANCOREADY) IVPB 2000 mg/400 mL        2,000 mg 200 mL/hr over 120 Minutes Intravenous  Once 02/28/23 0909 02/28/23 1336       Subjective: Patient seen and examined at bedside today.  Lying in bed.  Daughter at bedside.  She looks overall comfortable.  Pain on the left knee is well-controlled today.  Objective: Vitals:   03/04/23 1457 03/04/23 2248 03/05/23 0348 03/05/23 0800  BP: 133/69 139/67 (!) 148/81 (!) 151/72  Pulse: 96 100 (!) 106 98  Resp:  18 18 18   Temp: 98.9 F (37.2 C) 98.3 F (36.8 C) 99 F (37.2 C) 98.1 F (36.7 C)  TempSrc: Oral Oral Oral Oral  SpO2: 93% 96% 94% 93%  Weight:      Height:       No intake or output data in the 24 hours ending 03/05/23 1002 Filed  Weights   02/27/23 1807  Weight: 108.9 kg    Examination:  General exam: Overall comfortable, not in distress, obese HEENT: PERRL Respiratory system:  no wheezes or crackles  Cardiovascular system: S1 & S2 heard, RRR.  Gastrointestinal system: Abdomen is nondistended, soft and nontender. Central nervous system: Alert and oriented Extremities: Edema of the left knee, no clubbing ,no cyanosis Skin: No rashes, no ulcers,no icterus     Data Reviewed: I have personally reviewed following labs and imaging studies  CBC: Recent Labs   Lab 02/28/23 0034 02/28/23 0937 03/01/23 0440  WBC 10.6* 10.2 10.6*  NEUTROABS 7.6  --   --   HGB 11.1* 10.4* 10.1*  HCT 37.6 35.0* 33.9*  MCV 85.8 86.4 84.5  PLT 499* 484* 444*   Basic Metabolic Panel: Recent Labs  Lab 02/28/23 0034 02/28/23 0937 03/01/23 0440  NA 138  --  135  K 3.7  --  3.6  CL 104  --  102  CO2 23  --  23  GLUCOSE 134*  --  119*  BUN 14  --  11  CREATININE 0.89 0.83 0.80  CALCIUM 9.2  --  8.7*     Recent Results (from the past 240 hour(s))  Body fluid culture w Gram Stain     Status: None   Collection Time: 02/28/23  3:40 AM   Specimen: Synovium; Body Fluid  Result Value Ref Range Status   Specimen Description SYNOVIAL FLUID  Final   Special Requests LEFT KNEE  Final   Gram Stain   Final    FEW WBC PRESENT, PREDOMINANTLY PMN NO ORGANISMS SEEN    Culture   Final    NO GROWTH 3 DAYS Performed at [facility], [address] [place], [place] [address]    Report Status 03/03/2023 FINAL  Final  Culture, blood (Routine X 2) w Reflex to ID Panel     Status: None   Collection Time: 02/28/23  9:36 AM   Specimen: BLOOD  Result Value Ref Range Status   Specimen Description BLOOD BLOOD RIGHT HAND  Final   Special Requests   Final    BOTTLES DRAWN AEROBIC AND ANAEROBIC Blood Culture adequate volume   Culture   Final    NO GROWTH 5 DAYS Performed at [facility], [address], [place], [place] [address]    Report Status 03/05/2023 FINAL  Final  Culture, blood (Routine X 2) w Reflex to ID Panel     Status: None   Collection Time: 02/28/23 10:47 AM   Specimen: BLOOD  Result Value Ref Range Status   Specimen Description BLOOD BLOOD RIGHT HAND  Final   Special Requests   Final    BOTTLES DRAWN AEROBIC AND ANAEROBIC Blood Culture adequate volume   Culture   Final    NO GROWTH 5 DAYS Performed at [facility], [address], [place], [place] [address]    Report Status 03/05/2023 FINAL  Final     Radiology Studies: CT  Angio Chest Pulmonary Embolism (PE) W or WO Contrast  Result Date: 03/03/2023 CLINICAL DATA:  Pulmonary embolism suspected. High probability for pulmonary embolism EXAM: CT ANGIOGRAPHY CHEST WITH CONTRAST TECHNIQUE: Multidetector CT imaging of the chest was performed using the standard protocol during bolus administration of intravenous contrast. Multiplanar CT image reconstructions and MIPs were obtained to evaluate the vascular anatomy. RADIATION DOSE REDUCTION: This exam was performed according to the departmental dose-optimization program which includes automated exposure control,  adjustment of the mA and/or kV according to patient size and/or use of iterative reconstruction technique. CONTRAST:  75mL OMNIPAQUE IOHEXOL 350 MG/ML SOLN COMPARISON:  02/18/2023 FINDINGS: Cardiovascular: Suboptimal but satisfactory opacification of the pulmonary arteries to the segmental level. No evidence of pulmonary embolism. Normal heart size. No pericardial effusion. Mediastinum/Nodes: Negative for mass or adenopathy. Lungs/Pleura: Generalized airway thickening with intermittent airway collapse. Bands of ground-glass density with small pleural effusion greater on the right. Aeration has improved, especially in the subpleural upper lobes. No pneumothorax Upper Abdomen: Unremarkable Musculoskeletal: No acute or aggressive finding Review of the MIP images confirms the above findings. IMPRESSION: 1. Negative for pulmonary embolism. 2. Diffuse bronchitic airway thickening with mild atelectasis and small pleural effusions. Aeration has improved from 02/18/2023. Electronically Signed   By: Tiburcio Pea M.D.   On: 03/03/2023 12:30    Scheduled Meds:  aspirin EC  81 mg Oral Daily   atorvastatin  20 mg Oral QHS   heparin  5,000 Units Subcutaneous Q8H   insulin aspart  0-15 Units Subcutaneous TID WC   pantoprazole  40 mg Oral Daily   polyethylene glycol  17 g Oral Daily   senna-docusate  1 tablet Oral BID   Continuous  Infusions:   LOS: 5 days   Burnadette Pop, MD [facility] P10/07/2022, 10:02 AM

## 2023-03-05 NOTE — TOC Initial Note (Addendum)
Transition of Care [facility]) - Initial/Assessment Note    Patient Details  Name: [name] MRN: [id number] Date of Birth: 06-29-1950  Transition of Care [facility]) CM/SW Contact:    Lorri Frederick, LCSW Phone Number: 03/05/2023, 2:28 PM  Clinical Narrative:    CSW met with pt and daughter [name] to discuss DC recommendation for SNF.   Permission given to speak with daughter present.  They are both agreeable to SNF, medicare choice document provided, permission given to send out referral in the hub.  Pt lives with daughter, no current services.  Referral sent out in hub for SNF.         PASSR: [id number]     Expected Discharge Plan: Skilled Nursing Facility Barriers to Discharge: Continued Medical Work up, SNF Pending bed offer   Patient Goals and CMS Choice Patient states their goals for this hospitalization and ongoing recovery are:: be independent CMS Medicare.gov Compare Post Acute Care list provided to:: Patient Represenative (must comment) (daughter [name]) Choice offered to / list presented to : Patient, Adult Children      Expected Discharge Plan and Services In-house Referral: Clinical Social Work   Post Acute Care Choice: Skilled Nursing Facility Living arrangements for the past 2 months: Single Family Home Expected Discharge Date: 03/04/23                                    Prior Living Arrangements/Services Living arrangements for the past 2 months: Single Family Home Lives with:: Adult Children (lives with daughter [name]) Patient language and need for interpreter reviewed:: Yes Do you feel safe going back to the place where you live?: Yes      Need for Family Participation in Patient Care: Yes (Comment) Care giver support system in place?: Yes (comment) Current home services: Other (comment) (none) Criminal Activity/Legal Involvement Pertinent to Current Situation/Hospitalization: No - Comment as needed  Activities of Daily Living   ADL Screening  (condition at time of admission) Does the patient have a NEW difficulty with bathing/dressing/toileting/self-feeding that is expected to last >3 days?: Yes (Initiates electronic notice to provider for possible OT consult) Does the patient have a NEW difficulty with getting in/out of bed, walking, or climbing stairs that is expected to last >3 days?: No Does the patient have a NEW difficulty with communication that is expected to last >3 days?: Yes (Initiates electronic notice to provider for possible SLP consult) Is the patient deaf or have difficulty hearing?: No Does the patient have difficulty seeing, even when wearing glasses/contacts?: No Does the patient have difficulty concentrating, remembering, or making decisions?: No  Permission Sought/Granted Permission sought to share information with : Family Supports Permission granted to share information with : Yes, Verbal Permission Granted  Share Information with NAME: daughter [name]  Permission granted to share info w AGENCY: SNF        Emotional Assessment Appearance:: Appears stated age Attitude/Demeanor/Rapport: Engaged Affect (typically observed): Appropriate, Pleasant Orientation: : Oriented to Self, Oriented to Place, Oriented to  Time, Oriented to Situation      Admission diagnosis:  Left anterior knee pain [M25.562] Acute pain of left knee [M25.562] Patient Active Problem List   Diagnosis Date Noted   Left anterior knee pain 02/28/2023   Pneumonia 02/18/2023   Conjunctival hemorrhage of left eye 02/18/2023   Elevated BP without diagnosis of hypertension 02/18/2023   Abdominal pain 02/18/2023   Stress 06/11/2022  Osteoarthritis of both knees 04/30/2022   Other hyperlipidemia 04/03/2022   Type 2 diabetes mellitus with other specified complication (HCC) 02/28/2022   Other fatigue 01/15/2022   SOB (shortness of breath) on exertion 01/15/2022   Type 2 diabetes mellitus with obesity (HCC) 01/15/2022   Hyperlipidemia  associated with type 2 diabetes mellitus (HCC) 01/15/2022   Vitamin D deficiency 01/15/2022   Health care maintenance 01/15/2022   Depression screening 01/15/2022   Class 3 severe obesity with serious comorbidity and body mass index (BMI) of 45.0 to 49.9 in adult [facility]) 01/15/2022   Arthritis 10/11/2014   CKD stage 2 due to type 2 diabetes mellitus (HCC) 10/11/2014   Controlled diabetes mellitus type II without complication (HCC) 10/11/2014   Headache 10/11/2014   Personal history of methicillin resistant Staphylococcus aureus 10/11/2014   BP (high blood pressure) 10/11/2014   Extreme obesity 10/11/2014   HLD (hyperlipidemia) 10/11/2014   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 ([place]), [place] - [address] [place] ([place]) [place] [address] Phone: [phone] Fax: [phone]  [facility] - [place], [place] - [address] [place] [address] Phone: [phone] Fax: [phone]  [facility] [address] [place] [address] Phone: [phone] Fax: [phone]     Social Determinants of Health (SDOH) Social History: SDOH Screenings   Food Insecurity: No Food Insecurity (03/05/2023)  Housing: Low Risk  (03/05/2023)  Transportation Needs: No Transportation Needs (03/05/2023)  Utilities: Not At Risk (03/05/2023)  Depression (PHQ2-9): High  Risk (01/15/2022)  Tobacco Use: Medium Risk (02/27/2023)   SDOH Interventions:     Readmission Risk Interventions     No data to display

## 2023-03-05 NOTE — Plan of Care (Signed)
  Problem: Education: Goal: Knowledge of General Education information will improve Description: Including pain rating scale, medication(s)/side effects and non-pharmacologic comfort measures Outcome: Progressing   Problem: Activity: Goal: Risk for activity intolerance will decrease Outcome: Progressing   Problem: Pain Managment: Goal: General experience of comfort will improve Outcome: Progressing   

## 2023-03-05 NOTE — Progress Notes (Signed)
Physical Therapy Treatment Patient Details Name: [name] MRN: [id number] DOB: [birth year] Today's Date: 03/05/2023   History of Present Illness Patient is 72 y.o. female with recent hospitalization from 9/16-9/19/24 with diagnoses of CAP and now presents to ED with complaints of left knee pain that started around 2 days after recent discharge. PT had been seen in Ortho office 9/26 for aspiration of joint, presented to ED that evening with decreased mobility and inability to ambulate. PMH significant for back pain, DM, GERD, HLD, Rt TKA.    PT Comments  Pt with fair tolerance to treatment today. Pt continues to be limited by L knee pain and requires +2 Max A to stand with RW and stedy from a lower surface. No change in DC/DME recs at this time. PT will continue to follow.    If plan is discharge home, recommend the following: Two people to help with walking and/or transfers;Two people to help with bathing/dressing/bathroom;Assistance with cooking/housework;Direct supervision/assist for medications management;Assist for transportation;Help with stairs or ramp for entrance   Can travel by private vehicle     No  Equipment Recommendations  Other (comment) (Per accepting facility)    Recommendations for Other Services       Precautions / Restrictions Precautions Precautions: Fall Restrictions Weight Bearing Restrictions: No     Mobility  Bed Mobility Overal bed mobility: Needs Assistance Bed Mobility: Sit to Supine     Supine to sit: Min assist, HOB elevated     General bed mobility comments: assist to scoot to edge, increased time    Transfers Overall transfer level: Needs assistance Equipment used: Ambulation equipment used Transfers: Sit to/from Stand, Bed to chair/wheelchair/BSC Sit to Stand: Max assist, From elevated surface, +2 physical assistance           General transfer comment: max Ax2 with [employer] Transfer via Lift Equipment: Stedy  Ambulation/Gait                General Gait Details: Pt unable   Stairs             Wheelchair Mobility     Tilt Bed    Modified Rankin (Stroke Patients Only)       Balance Overall balance assessment: Needs assistance Sitting-balance support: Feet supported Sitting balance-Leahy Scale: Fair Sitting balance - Comments: EOB   Standing balance support: During functional activity, Bilateral upper extremity supported, Reliant on assistive device for balance Standing balance-Leahy Scale: Poor Standing balance comment: able to stand with max Ax2 and max effort, very pain to L knee.                            Cognition Arousal: Alert Behavior During Therapy: WFL for tasks assessed/performed Overall Cognitive Status: Within Functional Limits for tasks assessed                                          Exercises      General Comments General comments (skin integrity, edema, etc.): VSS      Pertinent Vitals/Pain Pain Assessment Pain Assessment: 0-10 Pain Score: 8  Pain Location: Lt knee with flexing Pain Descriptors / Indicators: Sore, Aching, Grimacing    Home Living                          Prior Function  PT Goals (current goals can now be found in the care plan section) Progress towards PT goals: Progressing toward goals    Frequency    Min 1X/week      PT Plan      Co-evaluation PT/OT/SLP Co-Evaluation/Treatment: Yes Reason for Co-Treatment: Complexity of the patient's impairments (multi-system involvement);To address functional/ADL transfers PT goals addressed during session: Mobility/safety with mobility;Proper use of DME OT goals addressed during session: ADL's and self-care;Proper use of Adaptive equipment and DME      AM-PAC PT "6 Clicks" Mobility   Outcome Measure  Help needed turning from your back to your side while in a flat bed without using bedrails?: A Lot Help needed moving from lying on your  back to sitting on the side of a flat bed without using bedrails?: A Lot Help needed moving to and from a bed to a chair (including a wheelchair)?: Total Help needed standing up from a chair using your arms (e.g., wheelchair or bedside chair)?: Total Help needed to walk in hospital room?: Total Help needed climbing 3-5 steps with a railing? : Total 6 Click Score: 8    End of Session Equipment Utilized During Treatment: Gait belt Activity Tolerance: Patient tolerated treatment well Patient left: in chair;with call bell/phone within reach;with chair alarm set Nurse Communication: Mobility status;Need for lift equipment PT Visit Diagnosis: Other abnormalities of gait and mobility (R26.89);Muscle weakness (generalized) (M62.81);Difficulty in walking, not elsewhere classified (R26.2);Pain Pain - Right/Left: Left Pain - part of body: Knee     Time: 1610-9604 PT Time Calculation (min) (ACUTE ONLY): 34 min  Charges:    $Therapeutic Activity: 8-22 mins PT General Charges $$ ACUTE PT VISIT: 1 Visit                     Shela Nevin, PT, DPT Acute Rehab Services [phone]    [name] 03/05/2023, 3:28 PM

## 2023-03-05 NOTE — Plan of Care (Signed)
  Problem: Education: Goal: Knowledge of General Education information will improve Description: Including pain rating scale, medication(s)/side effects and non-pharmacologic comfort measures Outcome: Progressing   Problem: Clinical Measurements: Goal: Ability to maintain clinical measurements within normal limits will improve Outcome: Progressing Goal: Cardiovascular complication will be avoided Outcome: Progressing   

## 2023-03-05 NOTE — Evaluation (Addendum)
Occupational Therapy Evaluation Patient Details Name: [name] MRN: [id number] DOB: [name] Today's Date: 03/05/2023   History of Present Illness Patient is 72 y.o. female with recent hospitalization from 9/16-9/19/24 with diagnoses of CAP and now presents to ED with complaints of left knee pain that started around 2 days after recent discharge. PT had been seen in Ortho office 9/26 for aspiration of joint, presented to ED that evening with decreased mobility and inability to ambulate. PMH significant for back pain, DM, GERD, HLD, Rt TKA.   Clinical Impression   Pt s/p above diagnosis. Pt c/o no pain at rest, significant pain 8/10 with standing and bending L knee. Pt c/o numbness to B hands, states has sensation but feels pins/needles in both hands. Pt lives at home with daughter, lives on main level, 1 step to enter, PLOF mod I for ADLs/IADLs. Pt currently requires max A for LB dressing, toileting, bathing, and has difficulty with bed mobility and standing due to pain. Pt transfers to recliner with max A x2 using Stedy. Pt would benefit from postacute therapy <3hrs/day to improve to safe, functional level prior to returning home, will be seen acutely during stay.       If plan is discharge home, recommend the following: Two people to help with walking and/or transfers;A lot of help with bathing/dressing/bathroom;Assistance with cooking/housework;Assist for transportation;Help with stairs or ramp for entrance    Functional Status Assessment  Patient has had a recent decline in their functional status and demonstrates the ability to make significant improvements in function in a reasonable and predictable amount of time.  Equipment Recommendations  Other (comment) (defer)    Recommendations for Other Services       Precautions / Restrictions Precautions Precautions: Fall Restrictions Weight Bearing Restrictions: No      Mobility Bed Mobility Overal bed mobility: Needs  Assistance Bed Mobility: Sit to Supine     Supine to sit: Min assist, HOB elevated     General bed mobility comments: assist to scoot to edge, increased time    Transfers Overall transfer level: Needs assistance Equipment used: Ambulation equipment used Transfers: Sit to/from Stand, Bed to chair/wheelchair/BSC Sit to Stand: Max assist, From elevated surface, +2 physical assistance           General transfer comment: max Ax2 with [place] Transfer via Lift Equipment: Stedy    Balance Overall balance assessment: Needs assistance Sitting-balance support: Feet supported Sitting balance-Leahy Scale: Fair Sitting balance - Comments: EOB   Standing balance support: During functional activity, Bilateral upper extremity supported, Reliant on assistive device for balance Standing balance-Leahy Scale: Poor Standing balance comment: able to stand with max Ax2 and max effort, very pain to L knee.                           ADL either performed or assessed with clinical judgement   ADL Overall ADL's : Needs assistance/impaired Eating/Feeding: Independent   Grooming: Set up;Sitting   Upper Body Bathing: Minimal assistance;Sitting   Lower Body Bathing: Maximal assistance;Sitting/lateral leans   Upper Body Dressing : Set up;Sitting   Lower Body Dressing: Maximal assistance;With adaptive equipment;Sit to/from stand   Toilet Transfer: Maximal assistance;+2 for physical assistance;+2 for safety/equipment;BSC/3in1 Antony Salmon)   Toileting- Clothing Manipulation and Hygiene: Maximal assistance;Bed level         General ADL Comments: Pt requires max A for LB dressing/bathing and toileting, max A x2 with Stedy for transfers, significant knee pain  with standing.     Vision Baseline Vision/History: 1 Wears glasses Ability to See in Adequate Light: 0 Adequate Patient Visual Report: No change from baseline       Perception         Praxis         Pertinent Vitals/Pain  Pain Assessment Pain Assessment: 0-10 Pain Score: 8  Pain Location: Lt knee with flexing Pain Descriptors / Indicators: Sore, Aching, Grimacing Pain Intervention(s): Monitored during session     Extremity/Trunk Assessment Upper Extremity Assessment Upper Extremity Assessment: RUE deficits/detail;LUE deficits/detail RUE Deficits / Details: R elbow pain/stiffness, decreased grip/pinch and elbow extension strength. B hand numbness RUE: Shoulder pain with ROM RUE Sensation: decreased light touch RUE Coordination: decreased fine motor LUE Deficits / Details: L shoudler stiffness, elbow/wrist pain, numbness in hands LUE: Shoulder pain with ROM LUE Sensation: decreased light touch LUE Coordination: decreased fine motor           Communication Communication Communication: No apparent difficulties   Cognition Arousal: Alert Behavior During Therapy: WFL for tasks assessed/performed Overall Cognitive Status: Within Functional Limits for tasks assessed                                       General Comments       Exercises     Shoulder Instructions      Home Living Family/patient expects to be discharged to:: Private residence Living Arrangements: Children Available Help at Discharge: Family;Available PRN/intermittently Type of Home: House Home Access: Stairs to enter [employer] of Steps: 1 Entrance Stairs-Rails: None Home Layout: Two level;Able to live on main level with bedroom/bathroom;Full bath on main level Alternate Level Stairs-Number of Steps: stays in new garage apartment.   Bathroom Shower/Tub: Producer, television/film/video: Standard     Home Equipment: Cane - single point;Rollator (4 wheels);Toilet riser;Shower seat - built Char[employer] fundraiser (2 wheels)   Additional Comments: Pt lives with daughter who works during the day, can assist before/after work.      Prior Functioning/Environment Prior Level of Function : Needs assist              Mobility Comments: uses RW for mobility in home ADLs Comments: pt does her own cooking, ind with dressing/bathing, uses sock aide at baseline.        OT Problem List: Decreased strength;Decreased range of motion;Decreased activity tolerance;Impaired balance (sitting and/or standing);Impaired UE functional use;Impaired sensation;Pain;Increased edema      OT Treatment/Interventions: Self-care/ADL training;Therapeutic exercise;Neuromuscular education;Energy conservation;DME and/or AE instruction;Manual therapy;Therapeutic activities;Balance training;Patient/family education    OT Goals(Current goals can be found in the care plan section) Acute Rehab OT Goals Patient Stated Goal: to decrease pain OT Goal Formulation: With patient/family Time For Goal Achievement: 03/12/23 Potential to Achieve Goals: Good  OT Frequency: Min 1X/week    Co-evaluation PT/OT/SLP Co-Evaluation/Treatment: Yes Reason for Co-Treatment: Complexity of the patient's impairments (multi-system involvement);To address functional/ADL transfers   OT goals addressed during session: ADL's and self-care;Proper use of Adaptive equipment and DME      AM-PAC OT "6 Clicks" Daily Activity     Outcome Measure Help from another person eating meals?: None Help from another person taking care of personal grooming?: A Little Help from another person toileting, which includes using toliet, bedpan, or urinal?: A Lot Help from another person bathing (including washing, rinsing, drying)?: A Lot Help from another person to put on  and taking off regular upper body clothing?: A Little Help from another person to put on and taking off regular lower body clothing?: A Lot 6 Click Score: 16   End of Session Equipment Utilized During Treatment: Gait belt;Other (comment) Antony Salmon) Nurse Communication: Mobility status  Activity Tolerance: Patient limited by pain Patient left: in chair;with call bell/phone within reach;with  family/visitor present  OT Visit Diagnosis: Unsteadiness on feet (R26.81);Other abnormalities of gait and mobility (R26.89);Repeated falls (R29.6);Muscle weakness (generalized) (M62.81);Pain Pain - Right/Left: Left Pain - part of body: Knee                Time: 6962-9528 OT Time Calculation (min): 30 min Charges:  OT General Charges $OT Visit: 1 Visit OT Evaluation $OT Eval Moderate Complexity: 1 [address], OTR/L   Alexis Goodell 03/05/2023, 11:36 AM

## 2023-03-06 DIAGNOSIS — M25562 Pain in left knee: Secondary | ICD-10-CM | POA: Diagnosis not present

## 2023-03-06 LAB — CBC
HCT: 34.3 % — ABNORMAL LOW (ref 36.0–46.0)
Hemoglobin: 10.2 g/dL — ABNORMAL LOW (ref 12.0–15.0)
MCH: 24.8 pg — ABNORMAL LOW (ref 26.0–34.0)
MCHC: 29.7 g/dL — ABNORMAL LOW (ref 30.0–36.0)
MCV: 83.5 fL (ref 80.0–100.0)
Platelets: 434 10*3/uL — ABNORMAL HIGH (ref 150–400)
RBC: 4.11 MIL/uL (ref 3.87–5.11)
RDW: 13.5 % (ref 11.5–15.5)
WBC: 9.2 10*3/uL (ref 4.0–10.5)
nRBC: 0 % (ref 0.0–0.2)

## 2023-03-06 LAB — BASIC METABOLIC PANEL
Anion gap: 10 (ref 5–15)
BUN: 10 mg/dL (ref 8–23)
CO2: 27 mmol/L (ref 22–32)
Calcium: 8.8 mg/dL — ABNORMAL LOW (ref 8.9–10.3)
Chloride: 101 mmol/L (ref 98–111)
Creatinine, Ser: 0.68 mg/dL (ref 0.44–1.00)
GFR, Estimated: >60 mL/min (ref >60)
Glucose, Bld: 128 mg/dL — ABNORMAL HIGH (ref 70–99)
Potassium: 3.8 mmol/L (ref 3.5–5.1)
Sodium: 138 mmol/L (ref 135–145)

## 2023-03-06 LAB — GLUCOSE, CAPILLARY
Glucose-Capillary: 159 mg/dL — ABNORMAL HIGH (ref 70–99)
Glucose-Capillary: 114 mg/dL — ABNORMAL HIGH (ref 70–99)
Glucose-Capillary: 240 mg/dL — ABNORMAL HIGH (ref 70–99)
Glucose-Capillary: 151 mg/dL — ABNORMAL HIGH (ref 70–99)

## 2023-03-06 MED ORDER — SENNOSIDES-DOCUSATE SODIUM 8.6-50 MG PO TABS: 1.0000 | ORAL_TABLET | Freq: Two times a day (BID) | ORAL | Status: AC

## 2023-03-06 MED ORDER — POLYETHYLENE GLYCOL 3350 17 G PO PACK: 17.0000 g | PACK | Freq: Every day | ORAL | Status: AC

## 2023-03-06 MED ORDER — PREDNISONE 20 MG PO TABS
40.0000 mg | ORAL_TABLET | Freq: Every day | ORAL | Status: DC
  Administered 2023-03-06 – 2023-03-07 (×2): 40 mg via ORAL
  Filled 2023-03-06 (×2): qty 2

## 2023-03-06 MED ORDER — LIDOCAINE 5 % EX PTCH
1.0000 | MEDICATED_PATCH | CUTANEOUS | Status: DC
  Administered 2023-03-06 – 2023-03-07 (×2): 1 via TRANSDERMAL
  Filled 2023-03-06 (×2): qty 1

## 2023-03-06 MED ORDER — PREDNISONE 20 MG PO TABS: 40.0000 mg | ORAL_TABLET | Freq: Every day | ORAL | Status: AC

## 2023-03-06 MED ORDER — TRAMADOL HCL 50 MG PO TABS: 50.0000 mg | ORAL_TABLET | Freq: Three times a day (TID) | ORAL | 0 refills | Status: AC | PRN

## 2023-03-06 MED ORDER — LIDOCAINE 5 % EX PTCH: 1.0000 | MEDICATED_PATCH | CUTANEOUS | Status: AC

## 2023-03-06 NOTE — Care Management Important Message (Signed)
Important Message  Patient Details  Name: [name] MRN: [id number] Date of Birth: Jun 04, 1950   Important Message Given:  Yes - Medicare IM     Sherilyn Banker 03/06/2023, 2:35 PM

## 2023-03-06 NOTE — Plan of Care (Signed)
  Problem: Clinical Measurements: Goal: Respiratory complications will improve Outcome: Progressing   Problem: Nutrition: Goal: Adequate nutrition will be maintained Outcome: Progressing   Problem: Pain Managment: Goal: General experience of comfort will improve Outcome: Progressing   Problem: Activity: Goal: Risk for activity intolerance will decrease Outcome: Not Progressing

## 2023-03-06 NOTE — Discharge Summary (Addendum)
Physician Discharge Summary  [name] BMW:[id number] DOB: 1951/03/27 DOA: 02/27/2023  PCP: Merri Brunette, MD  Admit date: 02/27/2023 Discharge date: 03/07/2023  Admitted From: Home Disposition: SNF  Discharge Condition:Stable CODE STATUS:FULL Diet recommendation: Carb Modified  Brief/Interim Summary: Patient is a 13 female with history of diabetes type 2, hyperlipidemia, recent history of community-acquired pneumonia who presented with left knee pain.  She recently had a joint aspiration on the right knee by [facility] on 9/26.  Workup on admission showed elevated CRP, ESR.  Labs showed elevated D-dimer.  Arthrocentesis showed no crystals, no signs of infection, antibiotics discontinued.  PT/OT recommending SNF on discharge.  Medically stable for discharge  Following problems were addressed during the hospitalization:  Left knee swelling/pain: Presented with tachycardia, elevated CRP/ESR.  Left knee aspiration done as an outpatient and also in ED.  Per orthopedics note, it showed monosodium urate crystals but as per arthrocentesis fluid analysis done in the ED on the same day, epic report  shows no crystals.  Uric acid level normal.  Continue pain management, supportive care.  Continue NSAIDs on discharge.  PT recommending SNF on discharge.  She needs to follow-up with orthopedics as an outpatient for the management of possible gout.  Started on short course of prednisone as well. Antibiotics have been discontinued which were initially started for suspicion of septic arthritis.Added lidocaine patch.   Type of diabetes:  Monitor blood sugars.  Continue home regimen on discharge   Hyperlipidemia: Continue Lipitor   Recent H/O  Community-acquired pneumonia: Completed antibiotics course, currently no respiratory symptoms   Tachycardia/elevated D-dimer: Currently stable.  CT ruled out PE.  Discharge Diagnoses:  Principal Problem:   Left anterior knee pain    Discharge  Instructions  Discharge Instructions     Call MD for:  difficulty breathing, headache or visual disturbances   Complete by: As directed    Call MD for:  persistant dizziness or light-headedness   Complete by: As directed    Call MD for:  persistant nausea and vomiting   Complete by: As directed    Diet - low sodium heart healthy   Complete by: As directed    Diet Carb Modified   Complete by: As directed    Discharge instructions   Complete by: As directed    Advised to follow-up with primary care physician in 1 week. Advised to continue tramadol as needed for pain control. Patient does not have any infection in the synovial fluid. Follow-up with orthopedics as scheduled.   Discharge instructions   Complete by: As directed    1)Please take prescribed medications as instructed 2)Follow up with orthopedics as an outpatient in 1 to 2 weeks.   Increase activity slowly   Complete by: As directed    Increase activity slowly   Complete by: As directed       Allergies as of 03/07/2023       Reactions   Codeine Itching   Morphine Itching   Sulindac Itching   Burnt spots        Medication List     STOP taking these medications    BAYER BACK & BODY PAIN EX ST PO       TAKE these medications    Accu-Chek FastClix Lancets Misc   alendronate 70 MG tablet Commonly known as: FOSAMAX Take 70 mg by mouth once a week. Monday   aspirin EC 81 MG tablet Take 81 mg by mouth daily.   atorvastatin 20 MG tablet Commonly  known as: LIPITOR TAKE ONE TABLET BY MOUTH AT BEDTIME   CALCIUM 600/VITAMIN D3 PO Take 1 tablet by mouth daily.   celecoxib 200 MG capsule Commonly known as: CeleBREX Take 1 capsule (200 mg total) by mouth daily.   cholecalciferol 25 MCG (1000 UNIT) tablet Commonly known as: VITAMIN D3 Take 1,000 Units by mouth daily.   COMPLETE MULTIVITAMIN/MINERAL PO Take 1 tablet by mouth daily.   FISH OIL PO Take 1 capsule by mouth in the morning and at  bedtime.   furosemide 40 MG tablet Commonly known as: LASIX Take 40 mg by mouth See admin instructions. Take 1 tablet by mouth 2 to 3 times a week   lidocaine 5 % Commonly known as: LIDODERM Place 1 patch onto the skin daily. Remove & Discard patch within 12 hours or as directed by MD   metFORMIN 500 MG tablet Commonly known as: GLUCOPHAGE Take 1 tablet (500 mg total) by mouth 2 (two) times daily with a meal. What changed: how much to take   methocarbamol 750 MG tablet Commonly known as: Robaxin-750 Take 1 tablet (750 mg total) by mouth every 6 (six) hours as needed for muscle spasms (back pain).   Ozempic (0.25 or 0.5 MG/DOSE) 2 MG/1.5ML Sopn Generic drug: Semaglutide(0.25 or 0.5MG /DOS) Inject 0.5 mg into the skin once a week. Sunday   pantoprazole 40 MG tablet Commonly known as: PROTONIX Take 1 tablet (40 mg total) by mouth daily.   polyethylene glycol 17 g packet Commonly known as: MIRALAX / GLYCOLAX Take 17 g by mouth daily.   predniSONE 20 MG tablet Commonly known as: DELTASONE Take 2 tablets (40 mg total) by mouth daily with breakfast for 5 days.   senna-docusate 8.6-50 MG tablet Commonly known as: Senokot-S Take 1 tablet by mouth 2 (two) times daily.   traMADol 50 MG tablet Commonly known as: ULTRAM Take 1 tablet (50 mg total) by mouth 3 (three) times daily as needed.        Follow-up Information     Merri Brunette, MD Follow up.   Specialty: Family Medicine Contact information: [phone] [address] [place] [address] [phone]         [facility] in 1 week(s).   Specialty: Specialist Contact information: [address] [place] [address] [phone]                Allergies  Allergen Reactions   Codeine Itching   Morphine Itching   Sulindac Itching    Burnt spots    Consultations: None   Procedures/Studies: CT Angio Chest Pulmonary Embolism (PE) W or WO Contrast  Result Date:  03/03/2023 CLINICAL DATA:  Pulmonary embolism suspected. High probability for pulmonary embolism EXAM: CT ANGIOGRAPHY CHEST WITH CONTRAST TECHNIQUE: Multidetector CT imaging of the chest was performed using the standard protocol during bolus administration of intravenous contrast. Multiplanar CT image reconstructions and MIPs were obtained to evaluate the vascular anatomy. RADIATION DOSE REDUCTION: This exam was performed according to the departmental dose-optimization program which includes automated exposure control, adjustment of the mA and/or kV according to patient size and/or use of iterative reconstruction technique. CONTRAST:  75mL OMNIPAQUE IOHEXOL 350 MG/ML SOLN COMPARISON:  02/18/2023 FINDINGS: Cardiovascular: Suboptimal but satisfactory opacification of the pulmonary arteries to the segmental level. No evidence of pulmonary embolism. Normal heart size. No pericardial effusion. Mediastinum/Nodes: Negative for mass or adenopathy. Lungs/Pleura: Generalized airway thickening with intermittent airway collapse. Bands of ground-glass density with small pleural effusion  greater on the right. Aeration has improved, especially in the subpleural upper lobes. No pneumothorax Upper Abdomen: Unremarkable Musculoskeletal: No acute or aggressive finding Review of the MIP images confirms the above findings. IMPRESSION: 1. Negative for pulmonary embolism. 2. Diffuse bronchitic airway thickening with mild atelectasis and small pleural effusions. Aeration has improved from 02/18/2023. Electronically Signed   By: Tiburcio Pea M.D.   On: 03/03/2023 12:30   DG Knee Complete 4 Views Left  Result Date: 02/24/2023 CLINICAL DATA:  Left knee pain and stiffness, no reported injury EXAM: LEFT KNEE - COMPLETE 4+ VIEW COMPARISON:  10/15/2012 left knee radiographs FINDINGS: Small suprapatellar left knee joint effusion. No fracture or dislocation. 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 No suspicious focal osseous lesions. IMPRESSION: 1. Small suprapatellar left knee joint effusion. No fracture or dislocation. 2. 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 Electronically Signed   By: Delbert Phenix M.D.   On: 02/24/2023 12:21   CT ABDOMEN PELVIS WO CONTRAST  Result Date: 02/18/2023 CLINICAL DATA:  Acute non localized abdominal pain. EXAM: CT ABDOMEN AND PELVIS WITHOUT CONTRAST TECHNIQUE: Multidetector CT imaging of the abdomen and pelvis was performed following the standard protocol without IV contrast. RADIATION DOSE REDUCTION: This exam was performed according to the departmental dose-optimization program which includes automated exposure control, adjustment of the mA and/or kV according to patient size and/or use of iterative reconstruction technique. COMPARISON:  None Available. FINDINGS: Lower chest: Small right pleural effusion. Hepatobiliary: No suspicious mass visualized on this unenhanced exam. Probable sub-centimeter cyst in anterior right hepatic lobe. Prior cholecystectomy. No evidence of biliary obstruction. Pancreas: No mass or inflammatory process visualized on this unenhanced exam. Spleen:  Within normal limits in size. 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 No evidence of urolithiasis or hydronephrosis. Small amount of contrast in renal collecting systems and contrast filling the urinary bladder, attributable to recent chest CTA. Stomach/Bowel: No evidence of obstruction, inflammatory process, or abnormal fluid collections. Normal appendix visualized. Vascular/Lymphatic: No pathologically enlarged lymph nodes identified. No evidence of abdominal aortic aneurysm. Reproductive:  No mass or other significant abnormality. Other: Small midline  epigastric and umbilical hernias are seen, which contain only fat. Musculoskeletal:  No suspicious bone lesions identified. IMPRESSION: No acute findings within the abdomen or pelvis. Small midline epigastric and umbilical abdominal wall hernias, which contain only fat. Small right pleural effusion. Electronically Signed   By: Danae Orleans M.D.   On: 02/18/2023 14:01   CT Angio Chest PE W and/or Wo Contrast  Result Date: 02/18/2023 CLINICAL DATA:  Back pain worsening over the last month. Worsening with coughing or breathing. Increased confusion and lethargy. EXAM: CT ANGIOGRAPHY CHEST WITH CONTRAST TECHNIQUE: Multidetector CT imaging of the chest was performed using the standard protocol during bolus administration of intravenous contrast. Multiplanar CT image reconstructions and MIPs were obtained to evaluate the vascular anatomy. RADIATION DOSE REDUCTION: This exam was performed according to the departmental dose-optimization program which includes automated exposure control, adjustment of the mA and/or kV according to patient size and/or use of iterative reconstruction technique. CONTRAST:  75mL OMNIPAQUE IOHEXOL 350 MG/ML SOLN COMPARISON:  Radiograph 02/17/2023 FINDINGS: Cardiovascular: Respiratory motion obscures the segmental and subsegmental arteries. No central pulmonary embolism. No pericardial effusion. Normal caliber thoracic aorta without dissection. Coronary artery and aortic atherosclerotic calcification. Mediastinum/Nodes: Bowing of posterior trachea  compatible with aspiration. The esophagus is unremarkable. 1.1 cm right pretracheal node on 6/36. Lungs/Pleura: Patchy bilateral consolidative opacities with associated interlobular septal thickening greatest in the upper lobes. Small right pleural effusion. No pneumothorax. Upper Abdomen: 2 low-density lesions in the liver are indeterminate but statistically likely to represent benign cysts or hemangiomas. No follow-up is recommended if patient is  low risk for [facility] and has no known malignancy. No acute abnormality. Musculoskeletal: No acute fracture. Review of the MIP images confirms the above findings. IMPRESSION: 1. Respiratory motion obscures the segmental and subsegmental arteries. No central pulmonary embolism. 2. Pulmonary findings compatible with multifocal pneumonia. Follow-up in 6-8 weeks after treatment is recommended to ensure resolution. 3. Small right pleural effusion. 4. Enlarged pretracheal node is likely reactive. Continued attention on follow-up. Aortic Atherosclerosis (ICD10-I70.0). Electronically Signed   By: Minerva Fester M.D.   On: 02/18/2023 02:56   CT Head Wo Contrast  Result Date: 02/18/2023 CLINICAL DATA:  Mental status change EXAM: CT HEAD WITHOUT CONTRAST TECHNIQUE: Contiguous axial images were obtained from the base of the skull through the vertex without intravenous contrast. RADIATION DOSE REDUCTION: This exam was performed according to the departmental dose-optimization program which includes automated exposure control, adjustment of the mA and/or kV according to patient size and/or use of iterative reconstruction technique. COMPARISON:  None Available. FINDINGS: Brain: No intracranial hemorrhage, mass effect, or evidence of acute infarct. No hydrocephalus. No extra-axial fluid collection. Vascular: No hyperdense vessel or unexpected calcification. Skull: No fracture or focal lesion. Sinuses/Orbits: No acute finding. Mucosal thickening about the left maxillary sinus ethmoid air cells. No mastoid effusion. Other: None. IMPRESSION: No acute intracranial abnormality. Electronically Signed   By: Minerva Fester M.D.   On: 02/18/2023 02:44   DG Chest 2 View  Result Date: 02/17/2023 CLINICAL DATA:  Chest pain, right-sided rib pain EXAM: CHEST - 2 VIEW COMPARISON:  09/27/2016 FINDINGS: Single frontal view of the chest demonstrates an unremarkable cardiac silhouette. There is patchy airspace disease within the right upper and  right lower lobe, consistent with multifocal bronchopneumonia. Small right parapneumonic effusion. No pneumothorax. No acute bony abnormalities. IMPRESSION: 1. Multifocal right-sided bronchopneumonia and small parapneumonic effusion. Electronically Signed   By: Sharlet Salina M.D.   On: 02/17/2023 22:12      Subjective: Patient seen and examined at bedside today.  Hemodynamically stable.  Lying in bed.  Overall comfortable.  Left knee pain persist but controllable.  Medically stable for discharge.  Discharge Exam: Vitals:   03/07/23 0603 03/07/23 0723  BP: 138/75 (!) 120/97  Pulse: 92 93  Resp: 16   Temp: 98 F (36.7 C) 97.9 F (36.6 C)  SpO2: 99% 91%   Vitals:   03/06/23 1717 03/06/23 1955 03/07/23 0603 03/07/23 0723  BP: 135/73 136/81 138/75 (!) 120/97  Pulse:  (!) 101 92 93  Resp: 18 18 16    Temp: 98.3 F (36.8 C) 98.8 F (37.1 C) 98 F (36.7 C) 97.9 F (36.6 C)  TempSrc: Oral Oral Oral Oral  SpO2: 93% 92% 99% 91%  Weight:      Height:        General: Pt is alert, awake, not in acute distress, morbidly obese Cardiovascular: RRR, S1/S2 +, no rubs, no gallo: Ps Respiratory: CTA bilaterally, no wheezing, no rhonchi Abdominal: Soft, NT, ND, bowel sounds + Extremities: no edema, no cyanosis, some tenderness of the left knee    The results of significant diagnostics from this hospitalization (including imaging, microbiology, ancillary and laboratory) are  listed below for reference.     Microbiology: Recent Results (from the past 240 hour(s))  Body fluid culture w Gram Stain     Status: None   Collection Time: 02/28/23  3:40 AM   Specimen: Synovium; Body Fluid  Result Value Ref Range Status   Specimen Description SYNOVIAL FLUID  Final   Special Requests LEFT KNEE  Final   Gram Stain   Final    FEW WBC PRESENT, PREDOMINANTLY PMN NO ORGANISMS SEEN    Culture   Final    NO GROWTH 3 DAYS Performed at [facility], [address], [place], [place] [address]     Report Status 03/03/2023 FINAL  Final  Culture, blood (Routine X 2) w Reflex to ID Panel     Status: None   Collection Time: 02/28/23  9:36 AM   Specimen: BLOOD  Result Value Ref Range Status   Specimen Description BLOOD BLOOD RIGHT HAND  Final   Special Requests   Final    BOTTLES DRAWN AEROBIC AND ANAEROBIC Blood Culture adequate volume   Culture   Final    NO GROWTH 5 DAYS Performed at [facility], [address], [place], [place] [address]    Report Status 03/05/2023 FINAL  Final  Culture, blood (Routine X 2) w Reflex to ID Panel     Status: None   Collection Time: 02/28/23 10:47 AM   Specimen: BLOOD  Result Value Ref Range Status   Specimen Description BLOOD BLOOD RIGHT HAND  Final   Special Requests   Final    BOTTLES DRAWN AEROBIC AND ANAEROBIC Blood Culture adequate volume   Culture   Final    NO GROWTH 5 DAYS Performed at [facility], [address] [place], [place] [address]    Report Status 03/05/2023 FINAL  Final     Labs: BNP (last 3 results) No results for input(s): "BNP" in the last 8760 hours. Basic Metabolic Panel: Recent Labs  Lab 02/28/23 0937 03/01/23 0440 03/06/23 0514  NA  --  135 138  K  --  3.6 3.8  CL  --  102 101  CO2  --  23 27  GLUCOSE  --  119* 128*  BUN  --  11 10  CREATININE 0.83 0.80 0.68  CALCIUM  --  8.7* 8.8*   Liver Function Tests: Recent Labs  Lab 03/01/23 0440  AST 17  ALT 15  ALKPHOS 83  BILITOT 0.3  PROT 6.4*  ALBUMIN 2.1*   No results for input(s): "LIPASE", "AMYLASE" in the last 168 hours. No results for input(s): "AMMONIA" in the last 168 hours. CBC: Recent Labs  Lab 02/28/23 0937 03/01/23 0440 03/06/23 0514  WBC 10.2 10.6* 9.2  HGB 10.4* 10.1* 10.2*  HCT 35.0* 33.9* 34.3*  MCV 86.4 84.5 83.5  PLT 484* 444* 434*   Cardiac Enzymes: No results for input(s): "CKTOTAL", "CKMB", "CKMBINDEX", "TROPONINI" in the last 168 hours. BNP: Invalid input(s): "POCBNP" CBG: Recent Labs  Lab  03/06/23 0810 03/06/23 1135 03/06/23 1709 03/06/23 2127 03/07/23 0724  GLUCAP 159* 114* 240* 151* 133*   D-Dimer No results for input(s): "DDIMER" in the last 72 hours. Hgb A1c No results for input(s): "HGBA1C" in the last 72 hours. Lipid Profile No results for input(s): "CHOL", "HDL", "LDLCALC", "TRIG", "CHOLHDL", "LDLDIRECT" in the last 72 hours. Thyroid function studies No results for input(s): "TSH", "T4TOTAL", "T3FREE", "THYROIDAB" in the last 72 hours.  Invalid input(s): "FREET3" Anemia work up  No results for input(s): "VITAMINB12", "FOLATE", "FERRITIN", "TIBC", "IRON", "RETICCTPCT" in the last 72 hours. Urinalysis    Component Value Date/Time   COLORURINE YELLOW 02/18/2023 0348   APPEARANCEUR CLEAR 02/18/2023 0348   LABSPEC 1.030 02/18/2023 0348   PHURINE 5.0 02/18/2023 0348   GLUCOSEU NEGATIVE 02/18/2023 0348   HGBUR NEGATIVE 02/18/2023 0348   BILIRUBINUR NEGATIVE 02/18/2023 0348   KETONESUR 5 (A) 02/18/2023 0348   PROTEINUR NEGATIVE 02/18/2023 0348   UROBILINOGEN 0.2 12/14/2010 0930   NITRITE NEGATIVE 02/18/2023 0348   LEUKOCYTESUR NEGATIVE 02/18/2023 0348   Sepsis Labs Recent Labs  Lab 02/28/23 0937 03/01/23 0440 03/06/23 0514  WBC 10.2 10.6* 9.2   Microbiology Recent Results (from the past 240 hour(s))  Body fluid culture w Gram Stain     Status: None   Collection Time: 02/28/23  3:40 AM   Specimen: Synovium; Body Fluid  Result Value Ref Range Status   Specimen Description SYNOVIAL FLUID  Final   Special Requests LEFT KNEE  Final   Gram Stain   Final    FEW WBC PRESENT, PREDOMINANTLY PMN NO ORGANISMS SEEN    Culture   Final    NO GROWTH 3 DAYS Performed at [facility], [address], [place], [place] [address]    Report Status 03/03/2023 FINAL  Final  Culture, blood (Routine X 2) w Reflex to ID Panel     Status: None   Collection Time: 02/28/23  9:36 AM   Specimen: BLOOD  Result Value Ref Range Status   Specimen Description BLOOD  BLOOD RIGHT HAND  Final   Special Requests   Final    BOTTLES DRAWN AEROBIC AND ANAEROBIC Blood Culture adequate volume   Culture   Final    NO GROWTH 5 DAYS Performed at [facility], [address], [place], [place] [address]    Report Status 03/05/2023 FINAL  Final  Culture, blood (Routine X 2) w Reflex to ID Panel     Status: None   Collection Time: 02/28/23 10:47 AM   Specimen: BLOOD  Result Value Ref Range Status   Specimen Description BLOOD BLOOD RIGHT HAND  Final   Special Requests   Final    BOTTLES DRAWN AEROBIC AND ANAEROBIC Blood Culture adequate volume   Culture   Final    NO GROWTH 5 DAYS Performed at [facility], [address] [place], [place] [address]    Report Status 03/05/2023 FINAL  Final    Please note: You were cared for by a hospitalist during your hospital stay. Once you are discharged, your primary care physician will handle any further medical issues. Please note that NO REFILLS for any discharge medications will be authorized once you are discharged, as it is imperative that you return to your primary care physician (or establish a relationship with a primary care physician if you do not have one) for your post hospital discharge needs so that they can reassess your need for medications and monitor your lab values.    Time coordinating discharge: 40 minutes  SIGNED:   Burnadette Pop, MD  [facility] 03/07/2023, 8:58 AM Pager [phone]  If 7PM-7AM, please contact night-coverage [email or web address] Password [email or web address]

## 2023-03-06 NOTE — Progress Notes (Signed)
Physical Therapy Treatment Patient Details Name: [name] MRN: [id number] DOB: [birth year] Today's Date: 03/06/2023   History of Present Illness Patient is 72 y.o. female with recent hospitalization from 9/16-9/19/24 with diagnoses of CAP and now presents to ED with complaints of left knee pain that started around 2 days after recent discharge. PT had been seen in Ortho office 9/26 for aspiration of joint, presented to ED that evening with decreased mobility and inability to ambulate. PMH significant for back pain, DM, GERD, HLD, Rt TKA.    PT Comments  Pt tolerated treatment well today. Pt with similar presentation to previous session. No change in DC/DME recs at this time. Pt anticipates DC to SNF tomorrow.   If plan is discharge home, recommend the following: Two people to help with walking and/or transfers;Two people to help with bathing/dressing/bathroom;Assistance with cooking/housework;Direct supervision/assist for medications management;Assist for transportation;Help with stairs or ramp for entrance   Can travel by private vehicle     No  Equipment Recommendations  Other (comment) (Per accepting facility)    Recommendations for Other Services       Precautions / Restrictions Precautions Precautions: Fall Restrictions Weight Bearing Restrictions: No     Mobility  Bed Mobility Overal bed mobility: Needs Assistance Bed Mobility: Supine to Sit     Supine to sit: +2 for physical assistance, Max assist     General bed mobility comments: Helicopter method.    Transfers Overall transfer level: Needs assistance Equipment used: Ambulation equipment used Transfers: Sit to/from Stand, Bed to chair/wheelchair/BSC Sit to Stand: From elevated surface, +2 physical assistance, Mod assist           General transfer comment: max Ax2 with [place] Transfer via Lift Equipment: Stedy  Ambulation/Gait               General Gait Details: Pt unable   Stairs              Wheelchair Mobility     Tilt Bed    Modified Rankin (Stroke Patients Only)       Balance Overall balance assessment: Needs assistance Sitting-balance support: Feet supported Sitting balance-Leahy Scale: Fair Sitting balance - Comments: EOB   Standing balance support: During functional activity, Bilateral upper extremity supported, Reliant on assistive device for balance Standing balance-Leahy Scale: Poor Standing balance comment: able to stand with max Ax2 and max effort, very pain to L knee.                            Cognition Arousal: Alert Behavior During Therapy: WFL for tasks assessed/performed Overall Cognitive Status: Within Functional Limits for tasks assessed                                          Exercises      General Comments        Pertinent Vitals/Pain Pain Assessment Pain Location: Lt knee with flexing Pain Descriptors / Indicators: Sore, Aching, Grimacing    Home Living                          Prior Function            PT Goals (current goals can now be found in the care plan section) Progress towards PT goals: Progressing toward goals  Frequency    Min 1X/week      PT Plan      Co-evaluation PT/OT/SLP Co-Evaluation/Treatment: Yes Reason for Co-Treatment: Complexity of the patient's impairments (multi-system involvement);To address functional/ADL transfers PT goals addressed during session: Mobility/safety with mobility;Proper use of DME OT goals addressed during session: ADL's and self-care;Proper use of Adaptive equipment and DME      AM-PAC PT "6 Clicks" Mobility   Outcome Measure  Help needed turning from your back to your side while in a flat bed without using bedrails?: A Lot Help needed moving from lying on your back to sitting on the side of a flat bed without using bedrails?: A Lot Help needed moving to and from a bed to a chair (including a wheelchair)?: Total Help  needed standing up from a chair using your arms (e.g., wheelchair or bedside chair)?: Total Help needed to walk in hospital room?: Total Help needed climbing 3-5 steps with a railing? : Total 6 Click Score: 8    End of Session Equipment Utilized During Treatment: Gait belt Activity Tolerance: Patient tolerated treatment well Patient left: in chair;with call bell/phone within reach;with chair alarm set Nurse Communication: Mobility status;Need for lift equipment PT Visit Diagnosis: Other abnormalities of gait and mobility (R26.89);Muscle weakness (generalized) (M62.81);Difficulty in walking, not elsewhere classified (R26.2);Pain Pain - Right/Left: Left Pain - part of body: Knee     Time: 9147-8295 PT Time Calculation (min) (ACUTE ONLY): 21 min  Charges:    $Therapeutic Activity: 8-22 mins PT General Charges $$ ACUTE PT VISIT: 1 Visit                     Shela Nevin, PT, DPT Acute Rehab Services [phone]    Gladys Damme 03/06/2023, 3:07 PM

## 2023-03-06 NOTE — TOC Progression Note (Addendum)
Transition of Care [facility]) - Progression Note    Patient Details  Name: [name] MRN: [id number] Date of Birth: 07/19/1950  [facility]) CM/SW Contact  Lorri Frederick, LCSW Phone Number: 03/06/2023, 10:20 AM  Clinical Narrative:   CSW spoke to pt regarding SNF bed offers, she asked CSW to speak to daughter [name].  CSW called, no answer, message sent asking for call back.   1100: TC [place].  Bed offers provided, she accepts offer at [facility].  CSW message with [employer].  No bed today, can receive pt tomorrow.  SNF auth request submitted and approved: [id number], 5 days: 10/4-10/8.    Expected Discharge Plan: Skilled Nursing Facility Barriers to Discharge: Continued Medical Work up, SNF Pending bed offer  Expected Discharge Plan and Services In-house Referral: Clinical Social Work   Post Acute Care Choice: Skilled Nursing Facility Living arrangements for the past 2 months: Single Family Home Expected Discharge Date: 03/06/23                                     Social Determinants of Health (SDOH) Interventions SDOH Screenings   Food Insecurity: No Food Insecurity (03/05/2023)  Housing: Low Risk  (03/05/2023)  Transportation Needs: No Transportation Needs (03/05/2023)  Utilities: Not At Risk (03/05/2023)  Depression (PHQ2-9): High Risk (01/15/2022)  Tobacco Use: Medium Risk (02/27/2023)    Readmission Risk Interventions     No data to display

## 2023-03-07 DIAGNOSIS — R2689 Other abnormalities of gait and mobility: Secondary | ICD-10-CM | POA: Diagnosis not present

## 2023-03-07 DIAGNOSIS — I5022 Chronic systolic (congestive) heart failure: Secondary | ICD-10-CM | POA: Diagnosis not present

## 2023-03-07 DIAGNOSIS — K219 Gastro-esophageal reflux disease without esophagitis: Secondary | ICD-10-CM | POA: Diagnosis not present

## 2023-03-07 DIAGNOSIS — E119 Type 2 diabetes mellitus without complications: Secondary | ICD-10-CM | POA: Diagnosis not present

## 2023-03-07 DIAGNOSIS — Z7401 Bed confinement status: Secondary | ICD-10-CM | POA: Diagnosis not present

## 2023-03-07 DIAGNOSIS — M1712 Unilateral primary osteoarthritis, left knee: Secondary | ICD-10-CM | POA: Diagnosis not present

## 2023-03-07 DIAGNOSIS — M25562 Pain in left knee: Secondary | ICD-10-CM | POA: Diagnosis not present

## 2023-03-07 DIAGNOSIS — M6281 Muscle weakness (generalized): Secondary | ICD-10-CM | POA: Diagnosis not present

## 2023-03-07 DIAGNOSIS — M170 Bilateral primary osteoarthritis of knee: Secondary | ICD-10-CM | POA: Diagnosis not present

## 2023-03-07 DIAGNOSIS — R1310 Dysphagia, unspecified: Secondary | ICD-10-CM | POA: Diagnosis not present

## 2023-03-07 DIAGNOSIS — Z4789 Encounter for other orthopedic aftercare: Secondary | ICD-10-CM | POA: Diagnosis not present

## 2023-03-07 DIAGNOSIS — M10062 Idiopathic gout, left knee: Secondary | ICD-10-CM | POA: Diagnosis not present

## 2023-03-07 DIAGNOSIS — M549 Dorsalgia, unspecified: Secondary | ICD-10-CM | POA: Diagnosis not present

## 2023-03-07 LAB — GLUCOSE, CAPILLARY
Glucose-Capillary: 133 mg/dL — ABNORMAL HIGH (ref 70–99)
Glucose-Capillary: 153 mg/dL — ABNORMAL HIGH (ref 70–99)

## 2023-03-07 NOTE — TOC Progression Note (Signed)
Transition of Care [facility]) - Progression Note    Patient Details  Name: [name] MRN: [id number] Date of Birth: 05/13/1951  Transition of Care [facility]) CM/SW Contact  Lorri Frederick, LCSW Phone Number: 03/07/2023, 8:48 AM  Clinical Narrative:   CSW confirmed with [facility] that they can receive pt today.      Expected Discharge Plan: Skilled Nursing Facility Barriers to Discharge: Continued Medical Work up, SNF Pending bed offer  Expected Discharge Plan and Services In-house Referral: Clinical Social Work   Post Acute Care Choice: Skilled Nursing Facility Living arrangements for the past 2 months: Single Family Home Expected Discharge Date: 03/06/23                                     Social Determinants of Health (SDOH) Interventions SDOH Screenings   Food Insecurity: No Food Insecurity (03/05/2023)  Housing: Low Risk  (03/05/2023)  Transportation Needs: No Transportation Needs (03/05/2023)  Utilities: Not At Risk (03/05/2023)  Depression (PHQ2-9): High Risk (01/15/2022)  Tobacco Use: Medium Risk (02/27/2023)    Readmission Risk Interventions     No data to display

## 2023-03-07 NOTE — Progress Notes (Signed)
Patient seen and examined the bedside today.  Hemodynamically stable.  Lying in bed.  Still have some discomfort on the left knee but has not worsened.  No new change in the medical management.  Discharge orders and summary were already placed yesterday.  Medically stable for discharge

## 2023-03-07 NOTE — Plan of Care (Signed)
°  Problem: Activity: °Goal: Risk for activity intolerance will decrease °Outcome: Progressing °  °Problem: Nutrition: °Goal: Adequate nutrition will be maintained °Outcome: Progressing °  °Problem: Coping: °Goal: Level of anxiety will decrease °Outcome: Progressing °  °

## 2023-03-07 NOTE — Discharge Planning (Signed)
Patient alert. IV access removed.Discharge teaching given to [employer] at [facility]. Discharge summary placed in discharge packet along with written prescription written by discharge provider. Patient will be transport to facility via [facility].

## 2023-03-07 NOTE — TOC Transition Note (Signed)
Transition of Care [facility]) - CM/SW Discharge Note   Patient Details  Name: [name] MRN: [id number] Date of Birth: 1950/09/15  Transition of Care [facility]) CM/SW Contact:  Lorri Frederick, LCSW Phone Number: 03/07/2023, 1:19 PM   Clinical Narrative:   Pt discharging to [facility].  RN call report to [phone].    Final next level of care: Skilled Nursing Facility Barriers to Discharge: Barriers Resolved   Patient Goals and CMS Choice CMS Medicare.gov Compare Post Acute Care list provided to:: Patient Represenative (must comment) (daughter [name]) Choice offered to / list presented to : Patient, Adult Children  Discharge Placement                Patient chooses bed at: [facility] Patient to be transferred to facility by: [facility] Name of family member notified: daughter [name] Patient and family notified of of transfer: 03/07/23  Discharge Plan and Services Additional resources added to the After Visit Summary for   In-house Referral: Clinical Social Work   Post Acute Care Choice: Skilled Nursing Facility                               Social Determinants of Health (SDOH) Interventions SDOH Screenings   Food Insecurity: No Food Insecurity (03/05/2023)  Housing: Low Risk  (03/05/2023)  Transportation Needs: No Transportation Needs (03/05/2023)  Utilities: Not At Risk (03/05/2023)  Depression (PHQ2-9): High Risk (01/15/2022)  Tobacco Use: Medium Risk (02/27/2023)     Readmission Risk Interventions     No data to display

## 2023-03-10 DIAGNOSIS — R2689 Other abnormalities of gait and mobility: Secondary | ICD-10-CM | POA: Diagnosis not present

## 2023-03-10 DIAGNOSIS — Z4789 Encounter for other orthopedic aftercare: Secondary | ICD-10-CM | POA: Diagnosis not present

## 2023-03-10 DIAGNOSIS — M25562 Pain in left knee: Secondary | ICD-10-CM | POA: Diagnosis not present

## 2023-03-10 DIAGNOSIS — M6281 Muscle weakness (generalized): Secondary | ICD-10-CM | POA: Diagnosis not present

## 2023-03-12 DIAGNOSIS — M10062 Idiopathic gout, left knee: Secondary | ICD-10-CM | POA: Diagnosis not present

## 2023-03-12 DIAGNOSIS — M1712 Unilateral primary osteoarthritis, left knee: Secondary | ICD-10-CM | POA: Diagnosis not present

## 2023-03-13 DIAGNOSIS — R2689 Other abnormalities of gait and mobility: Secondary | ICD-10-CM | POA: Diagnosis not present

## 2023-03-13 DIAGNOSIS — M6281 Muscle weakness (generalized): Secondary | ICD-10-CM | POA: Diagnosis not present

## 2023-03-13 DIAGNOSIS — M25562 Pain in left knee: Secondary | ICD-10-CM | POA: Diagnosis not present

## 2023-03-13 DIAGNOSIS — Z4789 Encounter for other orthopedic aftercare: Secondary | ICD-10-CM | POA: Diagnosis not present

## 2023-03-17 DIAGNOSIS — M25562 Pain in left knee: Secondary | ICD-10-CM | POA: Diagnosis not present

## 2023-03-17 DIAGNOSIS — R2689 Other abnormalities of gait and mobility: Secondary | ICD-10-CM | POA: Diagnosis not present

## 2023-03-17 DIAGNOSIS — M6281 Muscle weakness (generalized): Secondary | ICD-10-CM | POA: Diagnosis not present

## 2023-03-17 DIAGNOSIS — Z4789 Encounter for other orthopedic aftercare: Secondary | ICD-10-CM | POA: Diagnosis not present

## 2023-03-19 DIAGNOSIS — E119 Type 2 diabetes mellitus without complications: Secondary | ICD-10-CM | POA: Diagnosis not present

## 2023-03-19 DIAGNOSIS — I5022 Chronic systolic (congestive) heart failure: Secondary | ICD-10-CM | POA: Diagnosis not present

## 2023-03-19 DIAGNOSIS — K219 Gastro-esophageal reflux disease without esophagitis: Secondary | ICD-10-CM | POA: Diagnosis not present

## 2023-03-19 DIAGNOSIS — M25562 Pain in left knee: Secondary | ICD-10-CM | POA: Diagnosis not present

## 2023-03-20 DIAGNOSIS — Z4789 Encounter for other orthopedic aftercare: Secondary | ICD-10-CM | POA: Diagnosis not present

## 2023-03-20 DIAGNOSIS — M6281 Muscle weakness (generalized): Secondary | ICD-10-CM | POA: Diagnosis not present

## 2023-03-20 DIAGNOSIS — M25562 Pain in left knee: Secondary | ICD-10-CM | POA: Diagnosis not present

## 2023-03-20 DIAGNOSIS — R2689 Other abnormalities of gait and mobility: Secondary | ICD-10-CM | POA: Diagnosis not present

## 2023-03-25 DIAGNOSIS — M810 Age-related osteoporosis without current pathological fracture: Secondary | ICD-10-CM | POA: Diagnosis not present

## 2023-03-25 DIAGNOSIS — Z6841 Body Mass Index (BMI) 40.0 and over, adult: Secondary | ICD-10-CM | POA: Diagnosis not present

## 2023-03-25 DIAGNOSIS — M25469 Effusion, unspecified knee: Secondary | ICD-10-CM | POA: Diagnosis not present

## 2023-03-25 DIAGNOSIS — E119 Type 2 diabetes mellitus without complications: Secondary | ICD-10-CM | POA: Diagnosis not present

## 2023-03-25 DIAGNOSIS — E785 Hyperlipidemia, unspecified: Secondary | ICD-10-CM | POA: Diagnosis not present

## 2023-03-25 DIAGNOSIS — G5603 Carpal tunnel syndrome, bilateral upper limbs: Secondary | ICD-10-CM | POA: Diagnosis not present

## 2023-03-25 DIAGNOSIS — M2550 Pain in unspecified joint: Secondary | ICD-10-CM | POA: Diagnosis not present

## 2023-03-26 DIAGNOSIS — R200 Anesthesia of skin: Secondary | ICD-10-CM | POA: Diagnosis not present

## 2023-03-26 DIAGNOSIS — M51369 Other intervertebral disc degeneration, lumbar region without mention of lumbar back pain or lower extremity pain: Secondary | ICD-10-CM | POA: Diagnosis not present

## 2023-03-28 DIAGNOSIS — Z7983 Long term (current) use of bisphosphonates: Secondary | ICD-10-CM | POA: Diagnosis not present

## 2023-03-28 DIAGNOSIS — E785 Hyperlipidemia, unspecified: Secondary | ICD-10-CM | POA: Diagnosis not present

## 2023-03-28 DIAGNOSIS — K219 Gastro-esophageal reflux disease without esophagitis: Secondary | ICD-10-CM | POA: Diagnosis not present

## 2023-03-28 DIAGNOSIS — M5136 Other intervertebral disc degeneration, lumbar region with discogenic back pain only: Secondary | ICD-10-CM | POA: Diagnosis not present

## 2023-03-28 DIAGNOSIS — E669 Obesity, unspecified: Secondary | ICD-10-CM | POA: Diagnosis not present

## 2023-03-28 DIAGNOSIS — E1140 Type 2 diabetes mellitus with diabetic neuropathy, unspecified: Secondary | ICD-10-CM | POA: Diagnosis not present

## 2023-03-28 DIAGNOSIS — M1712 Unilateral primary osteoarthritis, left knee: Secondary | ICD-10-CM | POA: Diagnosis not present

## 2023-03-28 DIAGNOSIS — Z6841 Body Mass Index (BMI) 40.0 and over, adult: Secondary | ICD-10-CM | POA: Diagnosis not present

## 2023-03-28 DIAGNOSIS — G8929 Other chronic pain: Secondary | ICD-10-CM | POA: Diagnosis not present

## 2023-04-01 DIAGNOSIS — E669 Obesity, unspecified: Secondary | ICD-10-CM | POA: Diagnosis not present

## 2023-04-01 DIAGNOSIS — Z7983 Long term (current) use of bisphosphonates: Secondary | ICD-10-CM | POA: Diagnosis not present

## 2023-04-01 DIAGNOSIS — M1712 Unilateral primary osteoarthritis, left knee: Secondary | ICD-10-CM | POA: Diagnosis not present

## 2023-04-01 DIAGNOSIS — M5136 Other intervertebral disc degeneration, lumbar region with discogenic back pain only: Secondary | ICD-10-CM | POA: Diagnosis not present

## 2023-04-01 DIAGNOSIS — G8929 Other chronic pain: Secondary | ICD-10-CM | POA: Diagnosis not present

## 2023-04-01 DIAGNOSIS — E1140 Type 2 diabetes mellitus with diabetic neuropathy, unspecified: Secondary | ICD-10-CM | POA: Diagnosis not present

## 2023-04-01 DIAGNOSIS — K219 Gastro-esophageal reflux disease without esophagitis: Secondary | ICD-10-CM | POA: Diagnosis not present

## 2023-04-01 DIAGNOSIS — E785 Hyperlipidemia, unspecified: Secondary | ICD-10-CM | POA: Diagnosis not present

## 2023-04-01 DIAGNOSIS — Z6841 Body Mass Index (BMI) 40.0 and over, adult: Secondary | ICD-10-CM | POA: Diagnosis not present

## 2023-04-03 ENCOUNTER — Ambulatory Visit
Admission: RE | Admit: 2023-04-03 | Discharge: 2023-04-03 | Disposition: A | Discharge status: Home / Self Care | Payer: Medicare PPO | Source: Ambulatory Visit | Attending: Family Medicine | Admitting: Family Medicine

## 2023-04-03 DIAGNOSIS — M8588 Other specified disorders of bone density and structure, other site: Secondary | ICD-10-CM | POA: Diagnosis not present

## 2023-04-03 DIAGNOSIS — M810 Age-related osteoporosis without current pathological fracture: Secondary | ICD-10-CM

## 2023-04-03 DIAGNOSIS — N958 Other specified menopausal and perimenopausal disorders: Secondary | ICD-10-CM | POA: Diagnosis not present

## 2023-04-03 DIAGNOSIS — E2839 Other primary ovarian failure: Secondary | ICD-10-CM | POA: Diagnosis not present

## 2023-04-04 DIAGNOSIS — E785 Hyperlipidemia, unspecified: Secondary | ICD-10-CM | POA: Diagnosis not present

## 2023-04-04 DIAGNOSIS — M5136 Other intervertebral disc degeneration, lumbar region with discogenic back pain only: Secondary | ICD-10-CM | POA: Diagnosis not present

## 2023-04-04 DIAGNOSIS — Z6841 Body Mass Index (BMI) 40.0 and over, adult: Secondary | ICD-10-CM | POA: Diagnosis not present

## 2023-04-04 DIAGNOSIS — Z7983 Long term (current) use of bisphosphonates: Secondary | ICD-10-CM | POA: Diagnosis not present

## 2023-04-04 DIAGNOSIS — E1140 Type 2 diabetes mellitus with diabetic neuropathy, unspecified: Secondary | ICD-10-CM | POA: Diagnosis not present

## 2023-04-04 DIAGNOSIS — E669 Obesity, unspecified: Secondary | ICD-10-CM | POA: Diagnosis not present

## 2023-04-04 DIAGNOSIS — K219 Gastro-esophageal reflux disease without esophagitis: Secondary | ICD-10-CM | POA: Diagnosis not present

## 2023-04-04 DIAGNOSIS — M1712 Unilateral primary osteoarthritis, left knee: Secondary | ICD-10-CM | POA: Diagnosis not present

## 2023-04-04 DIAGNOSIS — G8929 Other chronic pain: Secondary | ICD-10-CM | POA: Diagnosis not present

## 2023-04-08 DIAGNOSIS — K219 Gastro-esophageal reflux disease without esophagitis: Secondary | ICD-10-CM | POA: Diagnosis not present

## 2023-04-08 DIAGNOSIS — E785 Hyperlipidemia, unspecified: Secondary | ICD-10-CM | POA: Diagnosis not present

## 2023-04-08 DIAGNOSIS — Z6841 Body Mass Index (BMI) 40.0 and over, adult: Secondary | ICD-10-CM | POA: Diagnosis not present

## 2023-04-08 DIAGNOSIS — M1712 Unilateral primary osteoarthritis, left knee: Secondary | ICD-10-CM | POA: Diagnosis not present

## 2023-04-08 DIAGNOSIS — M5136 Other intervertebral disc degeneration, lumbar region with discogenic back pain only: Secondary | ICD-10-CM | POA: Diagnosis not present

## 2023-04-08 DIAGNOSIS — E1140 Type 2 diabetes mellitus with diabetic neuropathy, unspecified: Secondary | ICD-10-CM | POA: Diagnosis not present

## 2023-04-08 DIAGNOSIS — E669 Obesity, unspecified: Secondary | ICD-10-CM | POA: Diagnosis not present

## 2023-04-08 DIAGNOSIS — Z7983 Long term (current) use of bisphosphonates: Secondary | ICD-10-CM | POA: Diagnosis not present

## 2023-04-08 DIAGNOSIS — G8929 Other chronic pain: Secondary | ICD-10-CM | POA: Diagnosis not present

## 2023-04-11 ENCOUNTER — Ambulatory Visit
Admission: RE | Admit: 2023-04-11 | Discharge: 2023-04-11 | Disposition: A | Discharge status: Home / Self Care | Payer: Medicare PPO | Source: Ambulatory Visit | Attending: Family Medicine | Admitting: Family Medicine

## 2023-04-11 ENCOUNTER — Other Ambulatory Visit: Payer: Self-pay | Admitting: Family Medicine

## 2023-04-11 DIAGNOSIS — M1712 Unilateral primary osteoarthritis, left knee: Secondary | ICD-10-CM | POA: Diagnosis not present

## 2023-04-11 DIAGNOSIS — I700 Atherosclerosis of aorta: Secondary | ICD-10-CM | POA: Diagnosis not present

## 2023-04-11 DIAGNOSIS — E1140 Type 2 diabetes mellitus with diabetic neuropathy, unspecified: Secondary | ICD-10-CM | POA: Diagnosis not present

## 2023-04-11 DIAGNOSIS — Z7983 Long term (current) use of bisphosphonates: Secondary | ICD-10-CM | POA: Diagnosis not present

## 2023-04-11 DIAGNOSIS — E785 Hyperlipidemia, unspecified: Secondary | ICD-10-CM | POA: Diagnosis not present

## 2023-04-11 DIAGNOSIS — E669 Obesity, unspecified: Secondary | ICD-10-CM | POA: Diagnosis not present

## 2023-04-11 DIAGNOSIS — J189 Pneumonia, unspecified organism: Secondary | ICD-10-CM

## 2023-04-11 DIAGNOSIS — K219 Gastro-esophageal reflux disease without esophagitis: Secondary | ICD-10-CM | POA: Diagnosis not present

## 2023-04-11 DIAGNOSIS — Z6841 Body Mass Index (BMI) 40.0 and over, adult: Secondary | ICD-10-CM | POA: Diagnosis not present

## 2023-04-11 DIAGNOSIS — M5136 Other intervertebral disc degeneration, lumbar region with discogenic back pain only: Secondary | ICD-10-CM | POA: Diagnosis not present

## 2023-04-11 DIAGNOSIS — G8929 Other chronic pain: Secondary | ICD-10-CM | POA: Diagnosis not present

## 2023-04-15 DIAGNOSIS — M5136 Other intervertebral disc degeneration, lumbar region with discogenic back pain only: Secondary | ICD-10-CM | POA: Diagnosis not present

## 2023-04-15 DIAGNOSIS — M1712 Unilateral primary osteoarthritis, left knee: Secondary | ICD-10-CM | POA: Diagnosis not present

## 2023-04-15 DIAGNOSIS — E669 Obesity, unspecified: Secondary | ICD-10-CM | POA: Diagnosis not present

## 2023-04-15 DIAGNOSIS — E785 Hyperlipidemia, unspecified: Secondary | ICD-10-CM | POA: Diagnosis not present

## 2023-04-15 DIAGNOSIS — G8929 Other chronic pain: Secondary | ICD-10-CM | POA: Diagnosis not present

## 2023-04-15 DIAGNOSIS — Z6841 Body Mass Index (BMI) 40.0 and over, adult: Secondary | ICD-10-CM | POA: Diagnosis not present

## 2023-04-15 DIAGNOSIS — Z7983 Long term (current) use of bisphosphonates: Secondary | ICD-10-CM | POA: Diagnosis not present

## 2023-04-15 DIAGNOSIS — E1140 Type 2 diabetes mellitus with diabetic neuropathy, unspecified: Secondary | ICD-10-CM | POA: Diagnosis not present

## 2023-04-15 DIAGNOSIS — K219 Gastro-esophageal reflux disease without esophagitis: Secondary | ICD-10-CM | POA: Diagnosis not present

## 2023-04-17 DIAGNOSIS — Z6841 Body Mass Index (BMI) 40.0 and over, adult: Secondary | ICD-10-CM | POA: Diagnosis not present

## 2023-04-17 DIAGNOSIS — Z7983 Long term (current) use of bisphosphonates: Secondary | ICD-10-CM | POA: Diagnosis not present

## 2023-04-17 DIAGNOSIS — M5136 Other intervertebral disc degeneration, lumbar region with discogenic back pain only: Secondary | ICD-10-CM | POA: Diagnosis not present

## 2023-04-17 DIAGNOSIS — M1712 Unilateral primary osteoarthritis, left knee: Secondary | ICD-10-CM | POA: Diagnosis not present

## 2023-04-17 DIAGNOSIS — E1140 Type 2 diabetes mellitus with diabetic neuropathy, unspecified: Secondary | ICD-10-CM | POA: Diagnosis not present

## 2023-04-17 DIAGNOSIS — G8929 Other chronic pain: Secondary | ICD-10-CM | POA: Diagnosis not present

## 2023-04-17 DIAGNOSIS — K219 Gastro-esophageal reflux disease without esophagitis: Secondary | ICD-10-CM | POA: Diagnosis not present

## 2023-04-17 DIAGNOSIS — E785 Hyperlipidemia, unspecified: Secondary | ICD-10-CM | POA: Diagnosis not present

## 2023-04-17 DIAGNOSIS — E669 Obesity, unspecified: Secondary | ICD-10-CM | POA: Diagnosis not present

## 2023-04-18 DIAGNOSIS — M5136 Other intervertebral disc degeneration, lumbar region with discogenic back pain only: Secondary | ICD-10-CM | POA: Diagnosis not present

## 2023-04-18 DIAGNOSIS — E1140 Type 2 diabetes mellitus with diabetic neuropathy, unspecified: Secondary | ICD-10-CM | POA: Diagnosis not present

## 2023-04-18 DIAGNOSIS — Z6841 Body Mass Index (BMI) 40.0 and over, adult: Secondary | ICD-10-CM | POA: Diagnosis not present

## 2023-04-18 DIAGNOSIS — E785 Hyperlipidemia, unspecified: Secondary | ICD-10-CM | POA: Diagnosis not present

## 2023-04-18 DIAGNOSIS — K219 Gastro-esophageal reflux disease without esophagitis: Secondary | ICD-10-CM | POA: Diagnosis not present

## 2023-04-18 DIAGNOSIS — G8929 Other chronic pain: Secondary | ICD-10-CM | POA: Diagnosis not present

## 2023-04-18 DIAGNOSIS — M1712 Unilateral primary osteoarthritis, left knee: Secondary | ICD-10-CM | POA: Diagnosis not present

## 2023-04-18 DIAGNOSIS — E669 Obesity, unspecified: Secondary | ICD-10-CM | POA: Diagnosis not present

## 2023-04-18 DIAGNOSIS — Z7983 Long term (current) use of bisphosphonates: Secondary | ICD-10-CM | POA: Diagnosis not present

## 2023-04-22 DIAGNOSIS — M2550 Pain in unspecified joint: Secondary | ICD-10-CM | POA: Diagnosis not present

## 2023-04-22 DIAGNOSIS — R52 Pain, unspecified: Secondary | ICD-10-CM | POA: Diagnosis not present

## 2023-04-22 DIAGNOSIS — Z6841 Body Mass Index (BMI) 40.0 and over, adult: Secondary | ICD-10-CM | POA: Diagnosis not present

## 2023-04-24 DIAGNOSIS — E785 Hyperlipidemia, unspecified: Secondary | ICD-10-CM | POA: Diagnosis not present

## 2023-04-24 DIAGNOSIS — Z6841 Body Mass Index (BMI) 40.0 and over, adult: Secondary | ICD-10-CM | POA: Diagnosis not present

## 2023-04-24 DIAGNOSIS — E1169 Type 2 diabetes mellitus with other specified complication: Secondary | ICD-10-CM | POA: Diagnosis not present

## 2023-04-24 DIAGNOSIS — R600 Localized edema: Secondary | ICD-10-CM | POA: Diagnosis not present

## 2023-05-26 DIAGNOSIS — Z5181 Encounter for therapeutic drug level monitoring: Secondary | ICD-10-CM | POA: Diagnosis not present

## 2023-05-26 DIAGNOSIS — M5459 Other low back pain: Secondary | ICD-10-CM | POA: Diagnosis not present

## 2023-05-26 DIAGNOSIS — Z79899 Other long term (current) drug therapy: Secondary | ICD-10-CM | POA: Diagnosis not present

## 2023-05-27 DIAGNOSIS — M353 Polymyalgia rheumatica: Secondary | ICD-10-CM | POA: Diagnosis not present

## 2023-06-02 DIAGNOSIS — Z6841 Body Mass Index (BMI) 40.0 and over, adult: Secondary | ICD-10-CM | POA: Diagnosis not present

## 2023-06-02 DIAGNOSIS — Z23 Encounter for immunization: Secondary | ICD-10-CM | POA: Diagnosis not present

## 2023-06-02 DIAGNOSIS — G8929 Other chronic pain: Secondary | ICD-10-CM | POA: Diagnosis not present

## 2023-06-02 DIAGNOSIS — M2550 Pain in unspecified joint: Secondary | ICD-10-CM | POA: Diagnosis not present

## 2023-06-02 DIAGNOSIS — M4727 Other spondylosis with radiculopathy, lumbosacral region: Secondary | ICD-10-CM | POA: Diagnosis not present

## 2023-06-05 DIAGNOSIS — M2550 Pain in unspecified joint: Secondary | ICD-10-CM | POA: Diagnosis not present

## 2023-06-05 DIAGNOSIS — Z6841 Body Mass Index (BMI) 40.0 and over, adult: Secondary | ICD-10-CM | POA: Diagnosis not present

## 2023-06-05 DIAGNOSIS — E785 Hyperlipidemia, unspecified: Secondary | ICD-10-CM | POA: Diagnosis not present

## 2023-06-05 DIAGNOSIS — R600 Localized edema: Secondary | ICD-10-CM | POA: Diagnosis not present

## 2023-06-05 DIAGNOSIS — E1169 Type 2 diabetes mellitus with other specified complication: Secondary | ICD-10-CM | POA: Diagnosis not present

## 2023-07-01 DIAGNOSIS — M2550 Pain in unspecified joint: Secondary | ICD-10-CM | POA: Diagnosis not present

## 2023-07-01 DIAGNOSIS — E1169 Type 2 diabetes mellitus with other specified complication: Secondary | ICD-10-CM | POA: Diagnosis not present

## 2023-07-07 DIAGNOSIS — E1169 Type 2 diabetes mellitus with other specified complication: Secondary | ICD-10-CM | POA: Diagnosis not present

## 2023-07-07 DIAGNOSIS — E785 Hyperlipidemia, unspecified: Secondary | ICD-10-CM | POA: Diagnosis not present

## 2023-07-07 DIAGNOSIS — Z6841 Body Mass Index (BMI) 40.0 and over, adult: Secondary | ICD-10-CM | POA: Diagnosis not present

## 2023-07-07 DIAGNOSIS — R600 Localized edema: Secondary | ICD-10-CM | POA: Diagnosis not present

## 2023-07-07 DIAGNOSIS — M2550 Pain in unspecified joint: Secondary | ICD-10-CM | POA: Diagnosis not present

## 2023-07-30 DIAGNOSIS — M25561 Pain in right knee: Secondary | ICD-10-CM | POA: Diagnosis not present

## 2023-07-30 DIAGNOSIS — M353 Polymyalgia rheumatica: Secondary | ICD-10-CM | POA: Diagnosis not present

## 2023-07-30 DIAGNOSIS — G8929 Other chronic pain: Secondary | ICD-10-CM | POA: Diagnosis not present

## 2023-07-30 DIAGNOSIS — Z96651 Presence of right artificial knee joint: Secondary | ICD-10-CM | POA: Diagnosis not present

## 2023-07-30 DIAGNOSIS — M1712 Unilateral primary osteoarthritis, left knee: Secondary | ICD-10-CM | POA: Diagnosis not present

## 2023-07-30 DIAGNOSIS — Z79899 Other long term (current) drug therapy: Secondary | ICD-10-CM | POA: Diagnosis not present

## 2023-08-06 DIAGNOSIS — E785 Hyperlipidemia, unspecified: Secondary | ICD-10-CM | POA: Diagnosis not present

## 2023-08-06 DIAGNOSIS — R600 Localized edema: Secondary | ICD-10-CM | POA: Diagnosis not present

## 2023-08-06 DIAGNOSIS — E1169 Type 2 diabetes mellitus with other specified complication: Secondary | ICD-10-CM | POA: Diagnosis not present

## 2023-08-06 DIAGNOSIS — Z6841 Body Mass Index (BMI) 40.0 and over, adult: Secondary | ICD-10-CM | POA: Diagnosis not present

## 2023-08-06 DIAGNOSIS — M2550 Pain in unspecified joint: Secondary | ICD-10-CM | POA: Diagnosis not present

## 2023-08-07 ENCOUNTER — Other Ambulatory Visit: Payer: Self-pay | Admitting: Family Medicine

## 2023-08-07 DIAGNOSIS — Z1231 Encounter for screening mammogram for malignant neoplasm of breast: Secondary | ICD-10-CM

## 2023-08-21 ENCOUNTER — Ambulatory Visit
Admission: RE | Admit: 2023-08-21 | Discharge: 2023-08-21 | Disposition: A | Discharge status: Home / Self Care | Source: Ambulatory Visit | Attending: Family Medicine | Admitting: Family Medicine

## 2023-08-21 DIAGNOSIS — Z1231 Encounter for screening mammogram for malignant neoplasm of breast: Secondary | ICD-10-CM

## 2023-08-25 DIAGNOSIS — M5416 Radiculopathy, lumbar region: Secondary | ICD-10-CM | POA: Diagnosis not present

## 2023-08-25 DIAGNOSIS — M5451 Vertebrogenic low back pain: Secondary | ICD-10-CM | POA: Diagnosis not present

## 2023-08-25 DIAGNOSIS — M51369 Other intervertebral disc degeneration, lumbar region without mention of lumbar back pain or lower extremity pain: Secondary | ICD-10-CM | POA: Diagnosis not present

## 2023-08-25 DIAGNOSIS — M353 Polymyalgia rheumatica: Secondary | ICD-10-CM | POA: Diagnosis not present

## 2023-09-03 DIAGNOSIS — M353 Polymyalgia rheumatica: Secondary | ICD-10-CM | POA: Diagnosis not present

## 2023-09-03 DIAGNOSIS — M545 Low back pain, unspecified: Secondary | ICD-10-CM | POA: Diagnosis not present

## 2023-09-03 DIAGNOSIS — G8929 Other chronic pain: Secondary | ICD-10-CM | POA: Diagnosis not present

## 2023-09-03 DIAGNOSIS — Z79899 Other long term (current) drug therapy: Secondary | ICD-10-CM | POA: Diagnosis not present

## 2023-09-03 DIAGNOSIS — Z96651 Presence of right artificial knee joint: Secondary | ICD-10-CM | POA: Diagnosis not present

## 2023-09-03 DIAGNOSIS — M25561 Pain in right knee: Secondary | ICD-10-CM | POA: Diagnosis not present

## 2023-09-03 DIAGNOSIS — M1712 Unilateral primary osteoarthritis, left knee: Secondary | ICD-10-CM | POA: Diagnosis not present

## 2023-09-17 DIAGNOSIS — R0981 Nasal congestion: Secondary | ICD-10-CM | POA: Diagnosis not present

## 2023-09-17 DIAGNOSIS — M545 Low back pain, unspecified: Secondary | ICD-10-CM | POA: Diagnosis not present

## 2023-09-17 DIAGNOSIS — M353 Polymyalgia rheumatica: Secondary | ICD-10-CM | POA: Diagnosis not present

## 2023-09-17 DIAGNOSIS — E1169 Type 2 diabetes mellitus with other specified complication: Secondary | ICD-10-CM | POA: Diagnosis not present

## 2023-09-29 ENCOUNTER — Encounter: Payer: Medicare PPO | Admitting: Internal Medicine

## 2023-10-02 DIAGNOSIS — Z6841 Body Mass Index (BMI) 40.0 and over, adult: Secondary | ICD-10-CM | POA: Diagnosis not present

## 2023-10-02 DIAGNOSIS — M2550 Pain in unspecified joint: Secondary | ICD-10-CM | POA: Diagnosis not present

## 2023-10-02 DIAGNOSIS — E785 Hyperlipidemia, unspecified: Secondary | ICD-10-CM | POA: Diagnosis not present

## 2023-10-02 DIAGNOSIS — R600 Localized edema: Secondary | ICD-10-CM | POA: Diagnosis not present

## 2023-10-02 DIAGNOSIS — E1169 Type 2 diabetes mellitus with other specified complication: Secondary | ICD-10-CM | POA: Diagnosis not present

## 2023-10-30 DIAGNOSIS — R52 Pain, unspecified: Secondary | ICD-10-CM | POA: Diagnosis not present

## 2023-10-30 DIAGNOSIS — Z0000 Encounter for general adult medical examination without abnormal findings: Secondary | ICD-10-CM | POA: Diagnosis not present

## 2023-10-30 DIAGNOSIS — E1169 Type 2 diabetes mellitus with other specified complication: Secondary | ICD-10-CM | POA: Diagnosis not present

## 2023-10-30 DIAGNOSIS — Z1211 Encounter for screening for malignant neoplasm of colon: Secondary | ICD-10-CM | POA: Diagnosis not present

## 2023-10-30 DIAGNOSIS — E785 Hyperlipidemia, unspecified: Secondary | ICD-10-CM | POA: Diagnosis not present

## 2023-10-30 DIAGNOSIS — M353 Polymyalgia rheumatica: Secondary | ICD-10-CM | POA: Diagnosis not present

## 2023-10-30 DIAGNOSIS — Z1331 Encounter for screening for depression: Secondary | ICD-10-CM | POA: Diagnosis not present

## 2023-10-30 DIAGNOSIS — M810 Age-related osteoporosis without current pathological fracture: Secondary | ICD-10-CM | POA: Diagnosis not present

## 2023-11-03 DIAGNOSIS — E785 Hyperlipidemia, unspecified: Secondary | ICD-10-CM | POA: Diagnosis not present

## 2023-11-03 DIAGNOSIS — R600 Localized edema: Secondary | ICD-10-CM | POA: Diagnosis not present

## 2023-11-03 DIAGNOSIS — E1169 Type 2 diabetes mellitus with other specified complication: Secondary | ICD-10-CM | POA: Diagnosis not present

## 2023-11-03 DIAGNOSIS — M2550 Pain in unspecified joint: Secondary | ICD-10-CM | POA: Diagnosis not present

## 2023-11-03 DIAGNOSIS — Z6841 Body Mass Index (BMI) 40.0 and over, adult: Secondary | ICD-10-CM | POA: Diagnosis not present

## 2023-11-06 DIAGNOSIS — Z1211 Encounter for screening for malignant neoplasm of colon: Secondary | ICD-10-CM | POA: Diagnosis not present

## 2023-11-07 DIAGNOSIS — M353 Polymyalgia rheumatica: Secondary | ICD-10-CM | POA: Diagnosis not present

## 2023-11-07 DIAGNOSIS — M1712 Unilateral primary osteoarthritis, left knee: Secondary | ICD-10-CM | POA: Diagnosis not present

## 2023-11-07 DIAGNOSIS — M25561 Pain in right knee: Secondary | ICD-10-CM | POA: Diagnosis not present

## 2023-11-07 DIAGNOSIS — G8929 Other chronic pain: Secondary | ICD-10-CM | POA: Diagnosis not present

## 2023-11-07 DIAGNOSIS — Z96651 Presence of right artificial knee joint: Secondary | ICD-10-CM | POA: Diagnosis not present

## 2023-11-07 DIAGNOSIS — Z79899 Other long term (current) drug therapy: Secondary | ICD-10-CM | POA: Diagnosis not present

## 2023-11-07 DIAGNOSIS — M545 Low back pain, unspecified: Secondary | ICD-10-CM | POA: Diagnosis not present

## 2023-11-14 DIAGNOSIS — E860 Dehydration: Secondary | ICD-10-CM | POA: Diagnosis not present

## 2023-11-18 DIAGNOSIS — E119 Type 2 diabetes mellitus without complications: Secondary | ICD-10-CM | POA: Diagnosis not present

## 2023-11-18 DIAGNOSIS — E1140 Type 2 diabetes mellitus with diabetic neuropathy, unspecified: Secondary | ICD-10-CM | POA: Diagnosis not present

## 2023-11-18 DIAGNOSIS — E1169 Type 2 diabetes mellitus with other specified complication: Secondary | ICD-10-CM | POA: Diagnosis not present

## 2023-12-01 DIAGNOSIS — Z6841 Body Mass Index (BMI) 40.0 and over, adult: Secondary | ICD-10-CM | POA: Diagnosis not present

## 2023-12-01 DIAGNOSIS — E1169 Type 2 diabetes mellitus with other specified complication: Secondary | ICD-10-CM | POA: Diagnosis not present

## 2023-12-01 DIAGNOSIS — R600 Localized edema: Secondary | ICD-10-CM | POA: Diagnosis not present

## 2023-12-01 DIAGNOSIS — E785 Hyperlipidemia, unspecified: Secondary | ICD-10-CM | POA: Diagnosis not present

## 2023-12-01 DIAGNOSIS — M2550 Pain in unspecified joint: Secondary | ICD-10-CM | POA: Diagnosis not present

## 2023-12-25 DIAGNOSIS — Z5181 Encounter for therapeutic drug level monitoring: Secondary | ICD-10-CM | POA: Diagnosis not present

## 2023-12-25 DIAGNOSIS — M51369 Other intervertebral disc degeneration, lumbar region without mention of lumbar back pain or lower extremity pain: Secondary | ICD-10-CM | POA: Diagnosis not present

## 2023-12-25 DIAGNOSIS — M5416 Radiculopathy, lumbar region: Secondary | ICD-10-CM | POA: Diagnosis not present

## 2023-12-29 DIAGNOSIS — E119 Type 2 diabetes mellitus without complications: Secondary | ICD-10-CM | POA: Diagnosis not present

## 2024-01-01 DIAGNOSIS — E785 Hyperlipidemia, unspecified: Secondary | ICD-10-CM | POA: Diagnosis not present

## 2024-01-01 DIAGNOSIS — M4727 Other spondylosis with radiculopathy, lumbosacral region: Secondary | ICD-10-CM | POA: Diagnosis not present

## 2024-01-01 DIAGNOSIS — M2550 Pain in unspecified joint: Secondary | ICD-10-CM | POA: Diagnosis not present

## 2024-01-01 DIAGNOSIS — E119 Type 2 diabetes mellitus without complications: Secondary | ICD-10-CM | POA: Diagnosis not present

## 2024-01-01 DIAGNOSIS — E1169 Type 2 diabetes mellitus with other specified complication: Secondary | ICD-10-CM | POA: Diagnosis not present

## 2024-01-01 DIAGNOSIS — Z6841 Body Mass Index (BMI) 40.0 and over, adult: Secondary | ICD-10-CM | POA: Diagnosis not present

## 2024-01-01 DIAGNOSIS — M1712 Unilateral primary osteoarthritis, left knee: Secondary | ICD-10-CM | POA: Diagnosis not present

## 2024-01-01 DIAGNOSIS — R600 Localized edema: Secondary | ICD-10-CM | POA: Diagnosis not present

## 2024-01-01 DIAGNOSIS — E669 Obesity, unspecified: Secondary | ICD-10-CM | POA: Diagnosis not present

## 2024-01-26 DIAGNOSIS — E1169 Type 2 diabetes mellitus with other specified complication: Secondary | ICD-10-CM | POA: Diagnosis not present

## 2024-01-26 DIAGNOSIS — E119 Type 2 diabetes mellitus without complications: Secondary | ICD-10-CM | POA: Diagnosis not present

## 2024-01-26 DIAGNOSIS — E1140 Type 2 diabetes mellitus with diabetic neuropathy, unspecified: Secondary | ICD-10-CM | POA: Diagnosis not present

## 2024-01-28 DIAGNOSIS — R600 Localized edema: Secondary | ICD-10-CM | POA: Diagnosis not present

## 2024-01-28 DIAGNOSIS — M2550 Pain in unspecified joint: Secondary | ICD-10-CM | POA: Diagnosis not present

## 2024-01-28 DIAGNOSIS — E1169 Type 2 diabetes mellitus with other specified complication: Secondary | ICD-10-CM | POA: Diagnosis not present

## 2024-01-28 DIAGNOSIS — M1712 Unilateral primary osteoarthritis, left knee: Secondary | ICD-10-CM | POA: Diagnosis not present

## 2024-01-28 DIAGNOSIS — E119 Type 2 diabetes mellitus without complications: Secondary | ICD-10-CM | POA: Diagnosis not present

## 2024-01-28 DIAGNOSIS — M25562 Pain in left knee: Secondary | ICD-10-CM | POA: Diagnosis not present

## 2024-01-28 DIAGNOSIS — E785 Hyperlipidemia, unspecified: Secondary | ICD-10-CM | POA: Diagnosis not present

## 2024-01-28 DIAGNOSIS — Z6841 Body Mass Index (BMI) 40.0 and over, adult: Secondary | ICD-10-CM | POA: Diagnosis not present

## 2024-02-01 DIAGNOSIS — E1140 Type 2 diabetes mellitus with diabetic neuropathy, unspecified: Secondary | ICD-10-CM | POA: Diagnosis not present

## 2024-02-01 DIAGNOSIS — M4727 Other spondylosis with radiculopathy, lumbosacral region: Secondary | ICD-10-CM | POA: Diagnosis not present

## 2024-02-01 DIAGNOSIS — E1169 Type 2 diabetes mellitus with other specified complication: Secondary | ICD-10-CM | POA: Diagnosis not present

## 2024-02-01 DIAGNOSIS — E119 Type 2 diabetes mellitus without complications: Secondary | ICD-10-CM | POA: Diagnosis not present

## 2024-02-01 DIAGNOSIS — M1712 Unilateral primary osteoarthritis, left knee: Secondary | ICD-10-CM | POA: Diagnosis not present

## 2024-02-01 DIAGNOSIS — E669 Obesity, unspecified: Secondary | ICD-10-CM | POA: Diagnosis not present

## 2024-02-10 DIAGNOSIS — Z79899 Other long term (current) drug therapy: Secondary | ICD-10-CM | POA: Diagnosis not present

## 2024-02-10 DIAGNOSIS — M25561 Pain in right knee: Secondary | ICD-10-CM | POA: Diagnosis not present

## 2024-02-10 DIAGNOSIS — M1712 Unilateral primary osteoarthritis, left knee: Secondary | ICD-10-CM | POA: Diagnosis not present

## 2024-02-10 DIAGNOSIS — M353 Polymyalgia rheumatica: Secondary | ICD-10-CM | POA: Diagnosis not present

## 2024-02-10 DIAGNOSIS — G8929 Other chronic pain: Secondary | ICD-10-CM | POA: Diagnosis not present

## 2024-02-10 DIAGNOSIS — R768 Other specified abnormal immunological findings in serum: Secondary | ICD-10-CM | POA: Diagnosis not present

## 2024-02-17 DIAGNOSIS — R42 Dizziness and giddiness: Secondary | ICD-10-CM | POA: Diagnosis not present

## 2024-02-17 DIAGNOSIS — R1110 Vomiting, unspecified: Secondary | ICD-10-CM | POA: Diagnosis not present

## 2024-02-25 DIAGNOSIS — E1169 Type 2 diabetes mellitus with other specified complication: Secondary | ICD-10-CM | POA: Diagnosis not present

## 2024-02-25 DIAGNOSIS — E1140 Type 2 diabetes mellitus with diabetic neuropathy, unspecified: Secondary | ICD-10-CM | POA: Diagnosis not present

## 2024-02-25 DIAGNOSIS — E119 Type 2 diabetes mellitus without complications: Secondary | ICD-10-CM | POA: Diagnosis not present

## 2024-03-02 DIAGNOSIS — E669 Obesity, unspecified: Secondary | ICD-10-CM | POA: Diagnosis not present

## 2024-03-02 DIAGNOSIS — Z6841 Body Mass Index (BMI) 40.0 and over, adult: Secondary | ICD-10-CM | POA: Diagnosis not present

## 2024-03-02 DIAGNOSIS — E785 Hyperlipidemia, unspecified: Secondary | ICD-10-CM | POA: Diagnosis not present

## 2024-03-02 DIAGNOSIS — M4727 Other spondylosis with radiculopathy, lumbosacral region: Secondary | ICD-10-CM | POA: Diagnosis not present

## 2024-03-02 DIAGNOSIS — R600 Localized edema: Secondary | ICD-10-CM | POA: Diagnosis not present

## 2024-03-02 DIAGNOSIS — E119 Type 2 diabetes mellitus without complications: Secondary | ICD-10-CM | POA: Diagnosis not present

## 2024-03-02 DIAGNOSIS — E1140 Type 2 diabetes mellitus with diabetic neuropathy, unspecified: Secondary | ICD-10-CM | POA: Diagnosis not present

## 2024-03-02 DIAGNOSIS — E1169 Type 2 diabetes mellitus with other specified complication: Secondary | ICD-10-CM | POA: Diagnosis not present

## 2024-03-02 DIAGNOSIS — M1712 Unilateral primary osteoarthritis, left knee: Secondary | ICD-10-CM | POA: Diagnosis not present

## 2024-03-02 DIAGNOSIS — M2550 Pain in unspecified joint: Secondary | ICD-10-CM | POA: Diagnosis not present

## 2024-03-26 DIAGNOSIS — E119 Type 2 diabetes mellitus without complications: Secondary | ICD-10-CM | POA: Diagnosis not present

## 2024-03-26 DIAGNOSIS — E1140 Type 2 diabetes mellitus with diabetic neuropathy, unspecified: Secondary | ICD-10-CM | POA: Diagnosis not present

## 2024-03-26 DIAGNOSIS — E1169 Type 2 diabetes mellitus with other specified complication: Secondary | ICD-10-CM | POA: Diagnosis not present

## 2024-04-02 DIAGNOSIS — M1712 Unilateral primary osteoarthritis, left knee: Secondary | ICD-10-CM | POA: Diagnosis not present

## 2024-04-02 DIAGNOSIS — R112 Nausea with vomiting, unspecified: Secondary | ICD-10-CM | POA: Diagnosis not present

## 2024-04-02 DIAGNOSIS — E1169 Type 2 diabetes mellitus with other specified complication: Secondary | ICD-10-CM | POA: Diagnosis not present

## 2024-04-02 DIAGNOSIS — E1140 Type 2 diabetes mellitus with diabetic neuropathy, unspecified: Secondary | ICD-10-CM | POA: Diagnosis not present

## 2024-04-02 DIAGNOSIS — E669 Obesity, unspecified: Secondary | ICD-10-CM | POA: Diagnosis not present

## 2024-04-02 DIAGNOSIS — M4727 Other spondylosis with radiculopathy, lumbosacral region: Secondary | ICD-10-CM | POA: Diagnosis not present

## 2024-04-02 DIAGNOSIS — R109 Unspecified abdominal pain: Secondary | ICD-10-CM | POA: Diagnosis not present

## 2024-04-02 DIAGNOSIS — E119 Type 2 diabetes mellitus without complications: Secondary | ICD-10-CM | POA: Diagnosis not present

## 2024-04-08 DIAGNOSIS — E785 Hyperlipidemia, unspecified: Secondary | ICD-10-CM | POA: Diagnosis not present

## 2024-04-08 DIAGNOSIS — R600 Localized edema: Secondary | ICD-10-CM | POA: Diagnosis not present

## 2024-04-08 DIAGNOSIS — M2550 Pain in unspecified joint: Secondary | ICD-10-CM | POA: Diagnosis not present

## 2024-04-08 DIAGNOSIS — Z6841 Body Mass Index (BMI) 40.0 and over, adult: Secondary | ICD-10-CM | POA: Diagnosis not present

## 2024-04-08 DIAGNOSIS — E1169 Type 2 diabetes mellitus with other specified complication: Secondary | ICD-10-CM | POA: Diagnosis not present

## 2024-04-14 DIAGNOSIS — G8929 Other chronic pain: Secondary | ICD-10-CM | POA: Diagnosis not present

## 2024-04-14 DIAGNOSIS — M353 Polymyalgia rheumatica: Secondary | ICD-10-CM | POA: Diagnosis not present

## 2024-04-25 DIAGNOSIS — E1140 Type 2 diabetes mellitus with diabetic neuropathy, unspecified: Secondary | ICD-10-CM | POA: Diagnosis not present

## 2024-04-25 DIAGNOSIS — E119 Type 2 diabetes mellitus without complications: Secondary | ICD-10-CM | POA: Diagnosis not present

## 2024-04-25 DIAGNOSIS — E1169 Type 2 diabetes mellitus with other specified complication: Secondary | ICD-10-CM | POA: Diagnosis not present

## 2024-05-02 DIAGNOSIS — E1169 Type 2 diabetes mellitus with other specified complication: Secondary | ICD-10-CM | POA: Diagnosis not present

## 2024-05-02 DIAGNOSIS — M1712 Unilateral primary osteoarthritis, left knee: Secondary | ICD-10-CM | POA: Diagnosis not present

## 2024-05-02 DIAGNOSIS — E1140 Type 2 diabetes mellitus with diabetic neuropathy, unspecified: Secondary | ICD-10-CM | POA: Diagnosis not present

## 2024-05-02 DIAGNOSIS — E119 Type 2 diabetes mellitus without complications: Secondary | ICD-10-CM | POA: Diagnosis not present

## 2024-05-02 DIAGNOSIS — E669 Obesity, unspecified: Secondary | ICD-10-CM | POA: Diagnosis not present

## 2024-05-02 DIAGNOSIS — M4727 Other spondylosis with radiculopathy, lumbosacral region: Secondary | ICD-10-CM | POA: Diagnosis not present

## 2024-05-04 DIAGNOSIS — E1122 Type 2 diabetes mellitus with diabetic chronic kidney disease: Secondary | ICD-10-CM | POA: Diagnosis not present

## 2024-05-04 DIAGNOSIS — G8929 Other chronic pain: Secondary | ICD-10-CM | POA: Diagnosis not present

## 2024-05-04 DIAGNOSIS — E876 Hypokalemia: Secondary | ICD-10-CM | POA: Diagnosis not present

## 2024-05-04 DIAGNOSIS — N1831 Chronic kidney disease, stage 3a: Secondary | ICD-10-CM | POA: Diagnosis not present

## 2024-05-04 DIAGNOSIS — E1169 Type 2 diabetes mellitus with other specified complication: Secondary | ICD-10-CM | POA: Diagnosis not present

## 2024-05-04 DIAGNOSIS — K219 Gastro-esophageal reflux disease without esophagitis: Secondary | ICD-10-CM | POA: Diagnosis not present

## 2024-05-04 DIAGNOSIS — M1990 Unspecified osteoarthritis, unspecified site: Secondary | ICD-10-CM | POA: Diagnosis not present

## 2024-05-04 DIAGNOSIS — Z9181 History of falling: Secondary | ICD-10-CM | POA: Diagnosis not present

## 2024-05-04 DIAGNOSIS — M810 Age-related osteoporosis without current pathological fracture: Secondary | ICD-10-CM | POA: Diagnosis not present

## 2024-05-04 DIAGNOSIS — E1142 Type 2 diabetes mellitus with diabetic polyneuropathy: Secondary | ICD-10-CM | POA: Diagnosis not present

## 2024-05-04 DIAGNOSIS — M353 Polymyalgia rheumatica: Secondary | ICD-10-CM | POA: Diagnosis not present

## 2024-05-04 DIAGNOSIS — G3184 Mild cognitive impairment, so stated: Secondary | ICD-10-CM | POA: Diagnosis not present

## 2024-05-04 DIAGNOSIS — M519 Unspecified thoracic, thoracolumbar and lumbosacral intervertebral disc disorder: Secondary | ICD-10-CM | POA: Diagnosis not present

## 2024-05-04 DIAGNOSIS — D84821 Immunodeficiency due to drugs: Secondary | ICD-10-CM | POA: Diagnosis not present

## 2024-05-04 DIAGNOSIS — Z7983 Long term (current) use of bisphosphonates: Secondary | ICD-10-CM | POA: Diagnosis not present

## 2024-05-04 DIAGNOSIS — E1136 Type 2 diabetes mellitus with diabetic cataract: Secondary | ICD-10-CM | POA: Diagnosis not present

## 2024-05-04 DIAGNOSIS — E785 Hyperlipidemia, unspecified: Secondary | ICD-10-CM | POA: Diagnosis not present

## 2024-05-04 DIAGNOSIS — Z7985 Long-term (current) use of injectable non-insulin antidiabetic drugs: Secondary | ICD-10-CM | POA: Diagnosis not present

## 2024-05-04 DIAGNOSIS — R269 Unspecified abnormalities of gait and mobility: Secondary | ICD-10-CM | POA: Diagnosis not present

## 2024-05-04 DIAGNOSIS — I129 Hypertensive chronic kidney disease with stage 1 through stage 4 chronic kidney disease, or unspecified chronic kidney disease: Secondary | ICD-10-CM | POA: Diagnosis not present

## 2024-05-05 DIAGNOSIS — Z96651 Presence of right artificial knee joint: Secondary | ICD-10-CM | POA: Diagnosis not present

## 2024-05-05 DIAGNOSIS — M1712 Unilateral primary osteoarthritis, left knee: Secondary | ICD-10-CM | POA: Diagnosis not present
